# Patient Record
Sex: Female | Born: 1935 | Race: White | Hispanic: No | Marital: Married | Smoking: Never smoker
Health system: Southern US, Community
[De-identification: ages and names within clinical notes are randomized; demographics above are authoritative.]

## PROBLEM LIST (undated history)

## (undated) DIAGNOSIS — G709 Myoneural disorder, unspecified: Secondary | ICD-10-CM

## (undated) DIAGNOSIS — H269 Unspecified cataract: Secondary | ICD-10-CM

## (undated) HISTORY — DX: Unspecified cataract: H26.9

## (undated) HISTORY — PX: CHOLECYSTECTOMY: SHX55

## (undated) HISTORY — DX: Myoneural disorder, unspecified: G70.9

## (undated) HISTORY — PX: EYE SURGERY: SHX253

---

## 2000-07-22 ENCOUNTER — Encounter: Admission: RE | Admit: 2000-07-22 | Discharge: 2000-07-22 | Payer: Self-pay | Admitting: Family Medicine

## 2000-07-22 ENCOUNTER — Encounter: Payer: Self-pay | Admitting: Family Medicine

## 2002-09-06 ENCOUNTER — Encounter: Admission: RE | Admit: 2002-09-06 | Discharge: 2002-09-06 | Payer: Self-pay | Admitting: Family Medicine

## 2002-09-06 ENCOUNTER — Encounter: Payer: Self-pay | Admitting: Family Medicine

## 2004-12-13 ENCOUNTER — Encounter: Admission: RE | Admit: 2004-12-13 | Discharge: 2004-12-13 | Payer: Self-pay | Admitting: Family Medicine

## 2007-05-04 ENCOUNTER — Encounter: Admission: RE | Admit: 2007-05-04 | Discharge: 2007-05-04 | Payer: Self-pay | Admitting: Family Medicine

## 2009-05-01 ENCOUNTER — Encounter: Admission: RE | Admit: 2009-05-01 | Discharge: 2009-05-01 | Payer: Self-pay | Admitting: Family Medicine

## 2010-09-08 ENCOUNTER — Encounter: Payer: Self-pay | Admitting: Family Medicine

## 2011-04-08 ENCOUNTER — Other Ambulatory Visit: Payer: Self-pay | Admitting: Family Medicine

## 2011-04-08 DIAGNOSIS — Z1231 Encounter for screening mammogram for malignant neoplasm of breast: Secondary | ICD-10-CM

## 2011-05-06 ENCOUNTER — Ambulatory Visit: Payer: Self-pay

## 2011-05-08 ENCOUNTER — Ambulatory Visit
Admission: RE | Admit: 2011-05-08 | Discharge: 2011-05-08 | Disposition: A | Discharge status: Home / Self Care | Payer: Medicare Other | Source: Ambulatory Visit | Attending: Family Medicine | Admitting: Family Medicine

## 2011-05-08 DIAGNOSIS — Z1231 Encounter for screening mammogram for malignant neoplasm of breast: Secondary | ICD-10-CM

## 2013-04-16 ENCOUNTER — Telehealth: Payer: Self-pay

## 2013-04-16 ENCOUNTER — Ambulatory Visit (INDEPENDENT_AMBULATORY_CARE_PROVIDER_SITE_OTHER): Payer: Medicare PPO | Admitting: Family Medicine

## 2013-04-16 VITALS — BP 126/72 | HR 71 | Temp 98.3°F | Resp 18 | Ht 66.5 in | Wt 177.0 lb

## 2013-04-16 DIAGNOSIS — B0229 Other postherpetic nervous system involvement: Secondary | ICD-10-CM

## 2013-04-16 DIAGNOSIS — T7840XA Allergy, unspecified, initial encounter: Secondary | ICD-10-CM

## 2013-04-16 MED ORDER — LIDOCAINE 5 % EX PTCH: 1.0000 | MEDICATED_PATCH | CUTANEOUS | Status: AC

## 2013-04-16 MED ORDER — IBUPROFEN 800 MG PO TABS: 800.0000 mg | ORAL_TABLET | Freq: Three times a day (TID) | ORAL | Status: DC | PRN

## 2013-04-16 MED ORDER — PREDNISONE 20 MG PO TABS: ORAL_TABLET | ORAL | Status: DC

## 2013-04-16 MED ORDER — GABAPENTIN 300 MG PO CAPS: 300.0000 mg | ORAL_CAPSULE | Freq: Every day | ORAL | Status: DC

## 2013-04-16 NOTE — Telephone Encounter (Signed)
Patient Patricia Peters called and has asked for someone to call her regarding a topical cream she has placed on her body. The cream has caused her to have a burning sensation. Please call at [phone].

## 2013-04-16 NOTE — Progress Notes (Signed)
77 year old woman comes in with post herpetic neuralgia along T10 dermatome. She's had this since April 8 of this year. She's tried a variety of remedies for this including ibuprofen 800 mg 3 times a day, Lidoderm patches, and then today she tried capsaicin cream. She immediately had burning dysesthesias along the area where she had the cream which persisted for 1-1/2 hours until she was just about to come back to the exam room. At the time I saw her the pain had significantly decreased but the erythema was persisting.  Objective: Patient has a erythematous macular rash along the distribution of the postherpetic changes on the right flank.  Assessment: Toxic reaction to the capsaicin cream  Plan: Discontinue the capsaicin cream. I discussed with patient some other alternatives including gabapentin at night, continue ibuprofen, and refilling the Lidoderm patches. Postherpetic neuralgia - Plan: lidocaine (LIDODERM) 5 %, ibuprofen (ADVIL,MOTRIN) 800 MG tablet, gabapentin (NEURONTIN) 300 MG capsule  Allergic reaction, initial encounter - Plan: predniSONE (DELTASONE) 20 MG tablet  Signed, Elvina Sidle, MD

## 2013-04-16 NOTE — Patient Instructions (Addendum)
Postherpetic Neuralgia Shingles is a painful disease. It is caused by the herpes zoster virus. This is the same virus which also causes chickenpox. It can affect the torso, limbs, or the face. For most people, shingles is a condition of rather sudden onset. Pain usually lasts about 1 month. In older patients, or patients with poor immune systems, a painful, long-standing (chronic) condition called postherpetic neuralgia can develop. This condition rarely happens before age 50. But at least 50% of people over 50 become affected following an attack of shingles. There is a natural tendency for this condition to improve over time with no treatment. Less than 5% of patients have pain that lasts for more than 1 year. DIAGNOSIS  Herpes is usually easily diagnosed on physical exam. Pain sometimes follows when the skin sores (lesions) have disappeared. It is called postherpetic neuralgia. That name simply means the pain that follows herpes. TREATMENT   Treating this condition may be difficult. Usually one of the tricyclic antidepressants, often amitriptyline, is the first line of treatment. There is evidence that the sooner these medications are given, the more likely they are to reduce pain.  Conventional analgesics, regional nerve blocks, and anticonvulsants have little benefit in most cases when used alone. Other tricyclic anti-depressants are used as a second option if the first antidepressant is unsuccessful.  Anticonvulsants, including carbamazepine, have been found to provide some added benefit when used with a tricyclic anti-depressant. This is especially for the stabbing type of pain similar to that of trigeminal neuralgia.  Chronic opioid therapy. This is a strong narcotic pain medication. It is used to treat pain that is resistant to other measures. The issues of dependency and tolerance can be reduced with closely managed care.  Some cream treatments are applied locally to the affected area. They  can help when used with other treatments. Their use may be difficult in the case of postherpetic trigeminal neuralgia. This is involved with the face. So the substances can irritate the eye and the skin around the eye. Examples of creams used include Capsaicin and lidocaine creams.  For shingles, antiviral therapies along with analgesics are recommended. Studies of the effect of anti-viral agents such as acyclovir on shingles have been done. They show improved rates of healing and decreased severity of sudden (acute) pain. Some observations suggest that nerve blocks during shingles infection will:  Reduce pain.  Shorten the acute episode.  Prevent the emergence of postherpetic neuralgia. Viral medications used include Acyclovir (Zovirax), Valacyclovir, Famciclovir and a lysine diet. Document Released: 10/25/2002 Document Revised: 10/27/2011 Document Reviewed: 08/04/2005 ExitCare Patient Information 2014 [employer].  

## 2013-04-18 NOTE — Telephone Encounter (Signed)
She used capsaicin on shingles, has come in for this, has seen Dr Milus Glazier. Want to see how she is called, no answer;

## 2013-06-07 ENCOUNTER — Other Ambulatory Visit: Payer: Self-pay

## 2013-06-07 DIAGNOSIS — Z1231 Encounter for screening mammogram for malignant neoplasm of breast: Secondary | ICD-10-CM

## 2013-06-14 ENCOUNTER — Ambulatory Visit
Admission: RE | Admit: 2013-06-14 | Discharge: 2013-06-14 | Disposition: A | Discharge status: Home / Self Care | Payer: Medicare PPO | Source: Ambulatory Visit

## 2013-06-14 DIAGNOSIS — Z1231 Encounter for screening mammogram for malignant neoplasm of breast: Secondary | ICD-10-CM

## 2013-06-25 ENCOUNTER — Ambulatory Visit: Payer: Medicare PPO

## 2013-06-25 ENCOUNTER — Other Ambulatory Visit: Payer: Self-pay | Admitting: Internal Medicine

## 2013-06-25 ENCOUNTER — Ambulatory Visit (INDEPENDENT_AMBULATORY_CARE_PROVIDER_SITE_OTHER): Payer: Medicare PPO | Admitting: Internal Medicine

## 2013-06-25 VITALS — BP 110/72 | HR 82 | Temp 97.5°F | Resp 18 | Wt 182.0 lb

## 2013-06-25 DIAGNOSIS — R269 Unspecified abnormalities of gait and mobility: Secondary | ICD-10-CM

## 2013-06-25 DIAGNOSIS — M25562 Pain in left knee: Secondary | ICD-10-CM

## 2013-06-25 DIAGNOSIS — B0229 Other postherpetic nervous system involvement: Secondary | ICD-10-CM

## 2013-06-25 DIAGNOSIS — R2689 Other abnormalities of gait and mobility: Secondary | ICD-10-CM

## 2013-06-25 DIAGNOSIS — M25569 Pain in unspecified knee: Secondary | ICD-10-CM

## 2013-06-25 HISTORY — DX: Other postherpetic nervous system involvement: B02.29

## 2013-06-25 NOTE — Progress Notes (Addendum)
This chart was scribed for [employer]. Merla Riches, MD by Caryn Bee, Medical Scribe. This patient was seen in Room/bed  and the patient's care was started at 4:12 PM.  Subjective:    Patient ID: Patricia Peters, female    DOB: 01/28/1936, 77 y.o.   MRN: [id number]  HPI HPI Comments: Patricia Peters is a 77 y.o. female who presents to [facility] complaining of intermittent, gradually worsening left knee pain that began again a couple of weeks ago. Pt states that in August she took a road trip and had done a bit of hiking before this same pain began. After getting out of the car she experienced knee pain. She will be going on another road trip to [place] soon. Pain is worsened when lifting leg and putting it down. She also reports that when she gets up after lying down she can barely walk. Pt states that her pain is worse today than it was yesterday. Pt denies pain while she is lying down. She states that she does a lot of walking daily. Pt states that yesterday she did not walk, but instead went to exercise class.  Worries that the pain will let her knee give way at night when she gets up to urinate. No weakness getting up and down Pain improves with long walks, being up and around Started on volt gel and ibu 800 by PCP 4 d ago  Also has questions re PHN-see hx/wonders if she should take gabap. Currently continuing steady improvement with lido patches,massage.  Review of Systems  Constitutional: Negative for fever, fatigue and unexpected weight change.  Musculoskeletal: Positive for arthralgias (left knee pain) and myalgias.  Skin: Negative for rash.  Psychiatric/Behavioral: Negative for sleep disturbance.   Past Surgical History  Procedure Laterality Date  . Cholecystectomy    . Eye surgery      Past Medical History  Diagnosis Date  . Cataract   . Neuromuscular disorder     Allergies  Allergen Reactions  . Ciprofloxacin         Objective:   Physical Exam  Nursing note and vitals  reviewed. Constitutional: She is oriented to person, place, and time. She appears well-developed and well-nourished. No distress.  HENT:  Head: Normocephalic.  Neck: Normal range of motion.  Pulmonary/Chest: Effort normal. No respiratory distress.  Musculoskeletal: She exhibits tenderness.  Tender along medial joint line along insertion of post med thigh. Tender along upper med tib border No swelling or redness Knee stressors intact Gets up w/out assis but has immed pain gait at this spot  Neurological: She is alert and oriented to person, place, and time.  Skin: Skin is warm and dry. She is not diaphoretic.  Psychiatric: She has a normal mood and affect. Her behavior is normal.     [facility] reading (PRIMARY) by  Dr. Josephina Gip bony problems identified medial joint line and proximal fibula   Assessment & Plan:  Left knee pain -tendonitis suspected  May be a pronator and needing orthotics for her hiking or at least new shoes at this point  Heat/stretch/walk in pool or outsde/then ice 30'///use meds as offered by pCP  PT if not better in 3 weeks  Antalgic gait---rx for walker to stabilize gait and prevent falls at night  PHN (postherpetic neuralgia)---favor no gaba now--cont same rx

## 2015-01-17 DIAGNOSIS — H9209 Otalgia, unspecified ear: Secondary | ICD-10-CM | POA: Diagnosis not present

## 2015-01-17 DIAGNOSIS — H6123 Impacted cerumen, bilateral: Secondary | ICD-10-CM | POA: Diagnosis not present

## 2015-01-18 DIAGNOSIS — B0229 Other postherpetic nervous system involvement: Secondary | ICD-10-CM | POA: Diagnosis not present

## 2015-02-04 ENCOUNTER — Ambulatory Visit (INDEPENDENT_AMBULATORY_CARE_PROVIDER_SITE_OTHER): Payer: Medicare PPO | Admitting: Family Medicine

## 2015-02-04 ENCOUNTER — Ambulatory Visit (INDEPENDENT_AMBULATORY_CARE_PROVIDER_SITE_OTHER): Payer: Medicare PPO

## 2015-02-04 VITALS — BP 109/66 | HR 77 | Temp 98.4°F | Resp 18 | Ht 66.0 in | Wt 184.2 lb

## 2015-02-04 DIAGNOSIS — S99922A Unspecified injury of left foot, initial encounter: Secondary | ICD-10-CM

## 2015-02-04 DIAGNOSIS — S90122A Contusion of left lesser toe(s) without damage to nail, initial encounter: Secondary | ICD-10-CM

## 2015-02-04 NOTE — Progress Notes (Signed)
Subjective:    Patient ID: Patricia Peters, female    DOB: 27-Aug-1935, 79 y.o.   MRN: [id number] Chief Complaint  Patient presents with  . Foot Injury    hit left small toe against rocking chair last night & wanted it checked out before going out of town on Tuesday    HPI  Didn't think it was to bad but has been hurting today more than she expected.  Doesn't think she broke it but would like an xray since she is going on vacation in 2d and wants info on how to care for it - doesn't know if to ice or if she should walk on it - found her tennis shoes don't bother her to much but couldn't wear a sandal.  Past Medical History  Diagnosis Date  . Cataract   . Neuromuscular disorder    Current Outpatient Prescriptions on File Prior to Visit  Medication Sig Dispense Refill  . diclofenac sodium (VOLTAREN) 1 % GEL Apply 4 g topically 4 (four) times daily.    Marland Kitchen ibuprofen (ADVIL,MOTRIN) 800 MG tablet Take 1 tablet (800 mg total) by mouth every 8 (eight) hours as needed for pain. 909 tablet 5  . lidocaine (LIDODERM) 5 % Place 1 patch onto the skin daily. Remove & Discard patch within 12 hours or as directed by MD 30 patch 5   No current facility-administered medications on file prior to visit.   Allergies  Allergen Reactions  . Ciprofloxacin      Review of Systems  Constitutional: Positive for activity change. Negative for fever, chills and unexpected weight change.  Musculoskeletal: Positive for joint swelling, arthralgias and gait problem. Negative for myalgias and back pain.  Skin: Positive for color change. Negative for rash and wound.  Neurological: Negative for weakness and numbness.  Hematological: Negative for adenopathy. Does not bruise/bleed easily.       Objective:  BP 109/66 mmHg  Pulse 77  Temp(Src) 98.4 F (36.9 C) (Oral)  Resp 18  Ht 5\' 6"  (1.676 m)  Wt 184 lb 4 oz (83.575 kg)  BMI 29.75 kg/m2  SpO2 97%  Physical Exam  Constitutional: She is oriented to person,  place, and time. She appears well-developed and well-nourished. No distress.  HENT:  Head: Normocephalic and atraumatic.  Right Ear: External ear normal.  Eyes: Conjunctivae are normal. No scleral icterus.  Pulmonary/Chest: Effort normal.  Musculoskeletal:       Left foot: There is decreased range of motion, tenderness, bony tenderness and swelling.       Feet:  Bruising over medial aspect of fifth phalanx with minimal ttp over MTP, more ttp over mid-phalanx.  Neurological: She is alert and oriented to person, place, and time.  Skin: Skin is warm and dry. She is not diaphoretic. No erythema.  Psychiatric: She has a normal mood and affect. Her behavior is normal.      [facility] reading (PRIMARY) by  Dr. Clelia Croft. Left 5th toe: no acute bony injury    Assessment & Plan:   1. Contusion of fifth toe of left foot, initial encounter   2. Toe injury, left, initial encounter   Pt reassured that toe sprain only - RICE. Can buddy tape x 1 wk, try firm soled shoe with wide toe box like a medical clog. Can transition back to normal shoe wear as pain allows. If still any sxs in 3-4 wks, RTC for further eval.  Orders Placed This Encounter  Procedures  . DG Toe 5th  Left    Standing Status: Future     Number of Occurrences: 1     Standing Expiration Date: 02/04/2016    Order Specific Question:  Reason for Exam (SYMPTOM  OR DIAGNOSIS REQUIRED)    Answer:  pain    Order Specific Question:  Preferred imaging location?    Answer:  External     Norberto Sorenson, MD MPH

## 2015-02-04 NOTE — Patient Instructions (Signed)
Buddy Taping of Toes °We have taped your toes together to keep them from moving. This is called "buddy taping" since we used a part of your own body to keep the injured part still. We placed soft padding between your toes to keep them from rubbing against each other. Buddy taping will help with healing and to reduce pain. Keep your toes buddy taped together for as long as directed by your caregiver. °HOME CARE INSTRUCTIONS  °· Raise your injured area above the level of your heart while sitting or lying down. Prop it up with pillows. °· An ice pack used every twenty minutes, while awake, for the first one to two days may be helpful. Put ice in a plastic bag and put a towel between the bag and your skin. °· Watch for signs that the taping is too tight. These signs may be: °¨ Numbness of your taped toes. °¨ Coolness of your taped toes. °¨ Color change in the area beyond the tape. °¨ Increased pain. °· If you have any of these signs, loosen or rewrap the tape. If you need to loosen or rewrap the buddy tape, make sure you use the padding again. °SEEK IMMEDIATE MEDICAL CARE IF:  °· You have worse pain, swelling, inflammation (soreness), drainage or bleeding after you rewrap the tape. °· Any new problems occur. °MAKE SURE YOU:  °· Understand these instructions. °· Will watch your condition. °· Will get help right away if you are not doing well or get worse. °Document Released: 05/08/2004 Document Revised: 10/27/2011 Document Reviewed: 08/01/2008 °ExitCare® Patient Information ©2015 [employer]. This information is not intended to replace advice given to you by your health care provider. Make sure you discuss any questions you have with your health care provider. ° °

## 2015-02-16 ENCOUNTER — Encounter: Payer: Self-pay | Admitting: Family Medicine

## 2015-04-30 ENCOUNTER — Other Ambulatory Visit: Payer: Self-pay

## 2015-04-30 DIAGNOSIS — Z1231 Encounter for screening mammogram for malignant neoplasm of breast: Secondary | ICD-10-CM

## 2015-05-15 ENCOUNTER — Ambulatory Visit
Admission: RE | Admit: 2015-05-15 | Discharge: 2015-05-15 | Disposition: A | Discharge status: Home / Self Care | Payer: Medicare PPO | Source: Ambulatory Visit

## 2015-05-15 DIAGNOSIS — Z1231 Encounter for screening mammogram for malignant neoplasm of breast: Secondary | ICD-10-CM | POA: Diagnosis not present

## 2015-06-20 DIAGNOSIS — J309 Allergic rhinitis, unspecified: Secondary | ICD-10-CM | POA: Diagnosis not present

## 2015-06-20 DIAGNOSIS — Z23 Encounter for immunization: Secondary | ICD-10-CM | POA: Diagnosis not present

## 2015-06-20 DIAGNOSIS — B0229 Other postherpetic nervous system involvement: Secondary | ICD-10-CM | POA: Diagnosis not present

## 2015-06-20 DIAGNOSIS — E7800 Pure hypercholesterolemia, unspecified: Secondary | ICD-10-CM | POA: Diagnosis not present

## 2016-04-17 ENCOUNTER — Ambulatory Visit (INDEPENDENT_AMBULATORY_CARE_PROVIDER_SITE_OTHER): Payer: Medicare Other

## 2016-04-17 DIAGNOSIS — Z23 Encounter for immunization: Secondary | ICD-10-CM | POA: Diagnosis not present

## 2016-05-23 ENCOUNTER — Ambulatory Visit (INDEPENDENT_AMBULATORY_CARE_PROVIDER_SITE_OTHER): Payer: Medicare Other

## 2016-05-23 ENCOUNTER — Ambulatory Visit (INDEPENDENT_AMBULATORY_CARE_PROVIDER_SITE_OTHER): Payer: Medicare Other | Admitting: Family Medicine

## 2016-05-23 VITALS — BP 112/72 | HR 79 | Temp 98.2°F | Resp 16 | Ht 66.0 in | Wt 183.4 lb

## 2016-05-23 DIAGNOSIS — M25561 Pain in right knee: Secondary | ICD-10-CM

## 2016-05-23 DIAGNOSIS — M1711 Unilateral primary osteoarthritis, right knee: Secondary | ICD-10-CM | POA: Diagnosis not present

## 2016-05-23 MED ORDER — IBUPROFEN 800 MG PO TABS: 800.0000 mg | ORAL_TABLET | Freq: Three times a day (TID) | ORAL | 0 refills | Status: DC | PRN

## 2016-05-23 NOTE — Progress Notes (Signed)
Subjective:  By signing my name below, I, Raven Peters, attest that this documentation has been prepared under the direction and in the presence of Patricia SorensonEva Axyl Sitzman, MD.  Electronically Signed: Andrew Auaven Peters, ED Scribe. 05/23/2016. 4:33 PM.   Patient ID: Patricia SessionsJoan Albaugh V., female    DOB: November 26, 1935, 80 y.o.   MRN: [id number]  HPI Chief Complaint  Patient presents with  . Leg Pain    right leg pain since monday    HPI Comments: Patricia SessionsJoan Parveen V. is a 80 y.o. female who presents to the [facility] complaining of right leg pain onset 4 days ago. Pt denies injury or fall.  She is a frequent hiker; last hiked 5 days ago prior to leg pain. She reports pain with climbing stairs with worsening pain during the night. She denies right leg locking or giving way. She's tried Voltaren with minimal relief. Pt reports hx of left leg pain 2-3 years ago  relieved with acupuncture. .   [place] about [place] so really hoping to improve her pain prior.  Patient Active Problem List   Diagnosis Date Noted  . PHN (postherpetic neuralgia) 06/25/2013   Past Medical History:  Diagnosis Date  . Cataract   . Neuromuscular disorder [facility](HCC)    Past Surgical History:  Procedure Laterality Date  . CHOLECYSTECTOMY    . EYE SURGERY     Allergies  Allergen Reactions  . Ciprofloxacin    Prior to Admission medications   Medication Sig Start Date End Date Taking? Authorizing Provider  diclofenac sodium (VOLTAREN) 1 % GEL Apply 4 g topically 4 (four) times daily.   Yes Historical Provider, MD  ibuprofen (ADVIL,MOTRIN) 800 MG tablet Take 1 tablet (800 mg total) by mouth every 8 (eight) hours as needed for pain. 04/16/13  Yes Elvina SidleKurt Lauenstein, MD  lidocaine (LIDODERM) 5 % Place 1 patch onto the skin daily. Remove & Discard patch within 12 hours or as directed by MD 04/16/13  Yes Elvina SidleKurt Lauenstein, MD   Social History   Social History  . Marital status: Married    Spouse name: N/A  . Number  of children: N/A  . Years of education: N/A   Occupational History  . Not on file.   Social History Main Topics  . Smoking status: Never Smoker  . Smokeless tobacco: Never Used  . Alcohol use Not on file  . Drug use: Unknown  . Sexual activity: Not on file   Other Topics Concern  . Not on file   Social History Narrative  . No narrative on file   [facility] 05/23/2016 02/04/2015  Decreased Interest 0 0  Down, Depressed, Hopeless 0 0  PHQ - 2 Score 0 0    Review of Systems  Constitutional: Positive for activity change. Negative for appetite change, chills and fever.  Cardiovascular: Negative for leg swelling.  Musculoskeletal: Positive for arthralgias. Negative for gait problem, joint swelling and myalgias.  Skin: Negative for color change, rash and wound.  Neurological: Negative for weakness and numbness.  Psychiatric/Behavioral: Positive for sleep disturbance. Negative for dysphoric mood.   Objective:   Physical Exam  Constitutional: She is oriented to person, place, and time. She appears well-developed and well-nourished. No distress.  HENT:  Head: Normocephalic and atraumatic.  Eyes: Conjunctivae and EOM are normal.  Neck: Neck supple.  Cardiovascular: Normal rate.   Pulmonary/Chest: Effort normal.  Musculoskeletal: Normal range of motion.  Tenderness of pes anserine  bursa and distal medial joint line. Positive McMurray's. minimal crepitous. Negative anterior and posterior Drawer's. Minimal pain with Valgus and Varus with minimal laxity.  Neurological: She is alert and oriented to person, place, and time.  Skin: Skin is warm and dry.  Psychiatric: She has a normal mood and affect. Her behavior is normal.  Nursing note and vitals reviewed.  Vitals:   05/23/16 1629  BP: 112/72  Pulse: 79  Resp: 16  Temp: 98.2 F (36.8 C)  TempSrc: Oral  SpO2: 96%  Weight: 183 lb 6.4 oz (83.2 kg)  Height: 5\' 6"  (1.676 m)   Dg Knee 1-2 Views Right  Result  Date: 05/23/2016 CLINICAL DATA:  New pain.  No known injury. EXAM: RIGHT KNEE - 1-2 VIEW COMPARISON:  None. FINDINGS: Mild joint space narrowing and early spurring in all 3 compartments. No acute bony abnormality. Specifically, no fracture, subluxation, or dislocation. Soft tissues are intact. No joint effusion IMPRESSION: Mild degenerative changes.  No acute bony abnormality. Electronically Signed   By: Charlett Nose M.D.   On: 05/23/2016 17:10    Assessment & Plan:   1. Right medial knee pain   2. Osteoarthritis of right knee, unspecified osteoarthritis type   Placed in Rt medial OA brace - rec wearing this on her [place] trip. RICE for sev d.  Orders Placed This Encounter  Procedures  . DG Knee 1-2 Views Right    Standing films to eval for OA please    Standing Status:   Future    Number of Occurrences:   1    Standing Expiration Date:   05/23/2017    Order Specific Question:   Reason for Exam (SYMPTOM  OR DIAGNOSIS REQUIRED)    Answer:   new pain just distal to the medial posterior joint line - no known injury    Order Specific Question:   Preferred imaging location?    Answer:   External    Meds ordered this encounter  Medications  . ibuprofen (ADVIL,MOTRIN) 800 MG tablet    Sig: Take 1 tablet (800 mg total) by mouth every 8 (eight) hours as needed.    Dispense:  90 tablet    Refill:  0    I personally performed the services described in this documentation, which was scribed in my presence. The recorded information has been reviewed and considered, and addended by me as needed.   Patricia Sorenson, M.D.  [facility] [address] [place], [place] [address] [phone] phone [phone] fax  05/24/16 10:46 PM

## 2016-05-23 NOTE — Patient Instructions (Addendum)
IF you received an x-ray today, you will receive an invoice from [facility]. Please contact [facility] at [phone] with questions or concerns regarding your invoice.   IF you received labwork today, you will receive an invoice from [employer]. Please contact [facility] at [phone] with questions or concerns regarding your invoice.   Our billing staff will not be able to assist you with questions regarding bills from these companies.  You will be contacted with the lab results as soon as they are available. The fastest way to get your results is to activate your My Chart account. Instructions are located on the last page of this paperwork. If you have not heard from [place] regarding the results in 2 weeks, please contact this office.    I do not think you have injured your ligament from your exam today.  However, all of the information about ice, heat, bracing, stretching, and exercises from this will apply to this osteoarthritis flair that you are having.  Medial Collateral Knee Ligament Sprain With Phase I Rehab The medial collateral ligament (MCL) of the knee helps hold the knee joint in proper alignment and prevents the bones from shifting out of alignment (displacing) to the inside (medially). Injury to the knee may cause a tear in the MCL ligament (sprain). Sprains may heal without treatment, but this often results in a loose joint. Sprains are classified into three categories. Grade 1 sprains cause pain, but the tendon is not lengthened. Grade 2 sprains include a lengthened ligament, due to the ligament being stretched or partially ruptured. With grade 2 sprains, there is still function, although possibly decreased. Grade 3 sprains involve a complete tear of the tendon or muscle, and function is usually impaired. SYMPTOMS   Pain and tenderness on the inner side of the knee.  A "pop," tearing or pulling sensation at the time of  injury.  Bruising (contusion) at the site of injury, within 48 hours of injury.  Knee stiffness.  Limping, often walking with the knee bent. CAUSES  An MCL sprain occurs when a force is placed on the ligament that is greater than it can handle. Common mechanisms of injury include:  Direct hit (trauma) to the outer side of the knee, especially if the foot is planted on the ground.  Forceful pivoting of the body and leg, while the foot is planted on the ground. RISK INCREASES WITH:  Contact sports (football, rugby).  Sports that require pivoting or cutting (soccer).  Poor knee strength and flexibility.  Improper equipment use. PREVENTION  Warm up and stretch properly before activity.  Maintain physical fitness:  Strength, flexibility and endurance.  Cardiovascular fitness.  Wear properly fitted protective equipment (correct length of cleats for surface).  Functional braces may be effective in preventing injury. PROGNOSIS  MCL tears usually heal without the need for surgery. Sometimes however, surgery is required. RELATED COMPLICATIONS  Frequently recurring symptoms, such as the knee giving way, knee instability or knee swelling.  Injury to other structures in the knee joint:  Meniscal cartilage, resulting in locking and swelling of the knee.  Articular cartilage, resulting in knee arthritis.  Other ligaments of the knee.  Injury to nerves, resulting in numbness of the outer leg, foot or ankle and weakness or paralysis, with inability to raise the ankle or toes.  Knee stiffness. TREATMENT Treatment first involves the use of ice and medicine, to reduce pain and inflammation. The use of strengthening and stretching exercises may help reduce pain  with activity. These exercises may be performed at home, but referral to a therapist is often advised. You may be advised to walk with crutches until you are able to walk without a limp. Your caregiver may provide you with a  hinged knee brace to help regain a full range of motion, while also protecting the injured knee. For severe MCL injuries or injuries that involve other ligaments of the knee, surgery is often advised. MEDICATION  Do not take pain medicine for 7 days before surgery.  Only use over-the-counter pain medicine as directed by your caregiver.  Only use prescription pain relievers as directed and only in needed amounts. HEAT AND COLD  Cold treatment (icing) should be applied for 10 to 15 minutes every 2 to 3 hours for inflammation and pain, and immediately after any activity, that aggravates the symptoms. Use ice packs or an ice massage.  Heat treatment may be used before performing stretching and strengthening activities prescribed by your caregiver, physical therapist or athletic trainer. Use a heat Zafir Schauer or warm water soak. SEEK MEDICAL CARE IF:   Symptoms get worse or do not improve in 4 to 6 weeks, despite treatment.  New, unexplained symptoms develop. EXERCISES  PHASE I EXERCISES  RANGE OF MOTION (ROM) AND STRETCHING EXERCISES-Medial Collateral Knee Ligament Sprain Phase I These are some of the initial exercises that your physician, physical therapist or athletic trainer may have you perform to begin your rehabilitation. When you demonstrate gains in your flexibility and strength, your caregiver may progress you to Phase II exercises. As you perform these exercises, remember:  These initial exercises are intended to be gentle. They will help you restore motion without increasing any swelling.  Completing these exercises allows less painful movement and prepares you for the more aggressive strengthening exercises in Phase II.  An effective stretch should be held for at least 30 seconds.  A stretch should never be painful. You should only feel a gentle lengthening or release in the stretched tissue. RANGE OF MOTION-Knee Flexion, Active  Lie on your back with both knees straight. (If this  causes back discomfort, bend your healthy knee, placing your foot flat on the floor.)  Slowly slide your heel back toward your buttocks until you feel a gentle stretch in the front of your knee or thigh.  Hold for __________ seconds. Slowly slide your heel back to the starting position. Repeat __________ times. Complete this exercise __________ times per day. STRETCH-Knee Flexion, Supine  Lie on the floor with your right / left heel and foot lightly touching the wall. (Place both feet on the wall if you do not use a door frame.)  Without using any effort, allow gravity to slide your foot down the wall slowly until you feel a gentle stretch in the front of your right / left knee.  Hold this stretch for __________ seconds. Then return the leg to the starting position, using your health leg for help, if needed. Repeat __________ times. Complete this stretch __________ times per day. RANGE OF MOTION-Knee Flexion and Extension, Active-Assisted  Sit on the edge of a table or chair with your thighs firmly supported. It may be helpful to place a folded towel under the end of your right / left thigh.  Flexion (bending): Place the ankle of your healthy leg on top of the other ankle. Use your healthy leg to gently bend your right / left knee until you feel a mild tension across the top of your knee.  Hold  for __________ seconds.  Extension (straightening): Switch your ankles so your right / left leg is on top. Use your healthy leg to straighten your right / left knee until you feel a mild tension on the backside of your knee.  Hold for __________ seconds. Repeat __________ times. Complete this exercise __________ times per day. STRETCH-Knee Extension Sitting  Sit with your right / left leg/heel propped on another chair, coffee table, or foot stool.  Allow your leg muscles to relax, letting gravity straighten out your knee.*  You should feel a stretch behind your right / left knee. Hold this  position for __________ seconds. Repeat __________ times. Complete this stretch __________ times per day. *Your physician, physical therapist or athletic trainer may instruct you to place a __________ weight on your thigh, just above your kneecap, to deepen the stretch. STRENGTHENING EXERCISES-Medial Collateral Knee Ligament Sprain Phase I These exercises may help you when beginning to rehabilitate your injury. They may resolve your symptoms with or without further involvement from your physician, physical therapist or athletic trainer. While completing these exercises, remember:   In order to return to more demanding activities, you will likely need to progress to more challenging exercises. Your physician, physical therapist or athletic trainer will advance your exercises when your tissues show adequate healing and your muscles demonstrate increased strength.  Muscles can gain both the endurance and the strength needed for everyday activities through controlled exercises.  Complete these exercises as instructed by your physician, physical therapist or athletic trainer. Increase the resistance and repetitions only as guided by your caregiver. STRENGTH-Quadriceps, Isometrics  Lie on your back with your right / left leg extended and your opposite knee bent.  Gradually tense the muscles in the front of your right / left thigh. You should see either your kneecap slide up toward your hip or an increased dimpling just above the knee. This motion will push the back of the knee down toward the floor, mat or bed on which you are lying.  Hold the muscle as tight as you can without increasing your pain for __________ seconds.  Relax the muscles slowly and completely in between each repetition. Repeat __________ times. Complete this exercise __________ times per day. STRENGTH-Quadriceps, Short Arcs  Lie on your back. Place a __________ inch towel roll under your knee so that the knee slightly  bends.  Raise only your lower leg by tightening the muscles in the front of your thigh. Do not allow your thigh to rise.  Hold this position for __________ seconds. Repeat __________ times. Complete this exercise __________ times per day. OPTIONAL ANKLE WEIGHTS: Begin with ____________________, but DO NOT exceed ____________________. Increase in 1 pound/0.5 kilogram increments.  STRENGTH--Quadriceps, Straight Leg Raises Quality counts! Watch for signs that the quadriceps muscle is working, to be sure you are strengthening the correct muscles and not "cheating" by substituting with healthier muscles.  Lay on your back with your right / left leg extended and your opposite knee bent.  Tense the muscles in the front of your right / left thigh. You should see either your knee cap slide up or increased dimpling just above the knee. Your thigh may even shake a bit.  Tighten these muscles even more and raise your leg 4 to 6 inches off the floor. Hold for __________ seconds.  Keeping these muscles tense, lower your leg.  Relax the muscles slowly and completely in between each repetition. Repeat __________ times. Complete this exercise __________ times per day. STRENGTH-Hamstring, Isometrics  Lie on your back on a firm surface.  Bend your right / left knee approximately __________ degrees.  Dig your heel into the surface as if you are trying to pull it toward your buttocks. Tighten the muscles in the back of your thighs to "dig" as hard as you can, without increasing any pain.  Hold this position for __________ seconds.  Release the tension gradually and allow your muscle to completely relax for __________ seconds in between each exercise. Repeat __________ times. Complete this exercise __________ times per day. STRENGTH-Hamstring, Curls  Lay on your stomach with your legs extended. (If you lay on a bed, your feet may hang over the edge.)  Tighten the muscles in the back of your thigh to  bend your right / left knee up to 90 degrees. Keep your hips flat on the bed.  Hold this position for __________ seconds.  Slowly lower your leg back to the starting position. Repeat __________ times. Complete this exercise __________ times per day. OPTIONAL ANKLE WEIGHTS: Begin with ____________________, but DO NOT exceed ____________________. Increase in 1 pound/0.5 kilogram increments.    This information is not intended to replace advice given to you by your health care provider. Make sure you discuss any questions you have with your health care provider.   Document Released: 08/04/2005 Document Revised: 08/25/2014 Document Reviewed: 11/16/2008 Elsevier Interactive Patient Education [employer]

## 2016-05-28 ENCOUNTER — Other Ambulatory Visit: Payer: Self-pay

## 2016-05-28 NOTE — Telephone Encounter (Signed)
diclofenac sodium (VOLTAREN) 1 % GEL  Patient stated she need Dr. Clelia CroftShaw to authorize the medication. Patient stated pharmacy don't consider it to be a refill.   [phone].

## 2016-05-29 NOTE — Telephone Encounter (Signed)
Last seen 05/2016 for knee pain, given ibuprofen rx.

## 2016-05-30 ENCOUNTER — Telehealth: Payer: Self-pay

## 2016-05-30 MED ORDER — DICLOFENAC SODIUM 1 % TD GEL: 4.0000 g | Freq: Four times a day (QID) | TRANSDERMAL | Status: DC

## 2016-05-30 NOTE — Telephone Encounter (Signed)
Rx request for new rx for Voltaren Gel. Spoke with Dr. Clelia CroftShaw and she approved. Sent refill to [facility].

## 2016-06-04 ENCOUNTER — Other Ambulatory Visit: Payer: Self-pay | Admitting: Emergency Medicine

## 2016-06-04 MED ORDER — DICLOFENAC SODIUM 1 % TD GEL: 4.0000 g | Freq: Four times a day (QID) | TRANSDERMAL | Status: DC

## 2016-06-04 NOTE — Telephone Encounter (Signed)
Clarification: Medication Voltaren gel printed instead of e[facility]scribed [facility] [place] I will call medication in

## 2016-06-04 NOTE — Telephone Encounter (Signed)
CB [phone] for pt. [facility] [address] [phone] [facility] Phone #[phone]. Pt says she never got this script. [place] [facility] [place] She needs by Friday, she is traveling out of town. p

## 2016-06-04 NOTE — Telephone Encounter (Signed)
Duplicate, see notes under 10/13 message.

## 2016-06-04 NOTE — Telephone Encounter (Signed)
[facility] and checked with pharm to see if it was covered by ins. He reported that it was not, cash price $48. Tried to do PA under [facility] and also [facility] who manages [employer] health plan, but neither recognized pt. LMOM for pt giving her above info. Asked her to [facility] w/name of comp who manages her Rx coverage on back of ins card and/or the number for pharmacies to call if she would like for me to try a PA.

## 2016-06-05 NOTE — Telephone Encounter (Signed)
Got a VM from [employer] asking me to call w/info on PA for this pt. Called them w/reference # [id number], and was told that the PA was approved through 08/17/16.

## 2017-03-28 ENCOUNTER — Encounter: Payer: Self-pay | Admitting: Family Medicine

## 2017-03-28 ENCOUNTER — Ambulatory Visit (INDEPENDENT_AMBULATORY_CARE_PROVIDER_SITE_OTHER): Payer: Medicare Other | Admitting: Family Medicine

## 2017-03-28 VITALS — BP 110/71 | HR 83 | Temp 98.6°F | Resp 16 | Ht 65.5 in | Wt 181.0 lb

## 2017-03-28 DIAGNOSIS — R059 Cough, unspecified: Secondary | ICD-10-CM

## 2017-03-28 DIAGNOSIS — R05 Cough: Secondary | ICD-10-CM | POA: Diagnosis not present

## 2017-03-28 DIAGNOSIS — J040 Acute laryngitis: Secondary | ICD-10-CM

## 2017-03-28 LAB — POCT RAPID STREP A (OFFICE): RAPID STREP A SCREEN: NEGATIVE

## 2017-03-28 MED ORDER — GUAIFENESIN-CODEINE 100-10 MG/5ML PO SOLN
5.0000 mL | Freq: Four times a day (QID) | ORAL | 0 refills | Status: DC | PRN
Start: 2017-03-28 — End: 2023-05-03

## 2017-03-28 NOTE — Progress Notes (Signed)
Subjective:    Patient ID: Patricia SessionsJoan Turski V., female    DOB: 15-Apr-1936, 81 y.o.   MRN: [id number]  03/28/2017  Sore Throat (3 days, loss voice, ) and Cough (3 days , HA)   HPI This 81 y.o. female presents for evaluation of laryngitis and cough for the past three days.  No fever/chills/sweats.  +HA.  L ear pain intermitent.  +ST now resolved; duratoin two days only.  No pain to swallow.  +rhinorrhea and nasla congestion.  +PND.  No sneezing.  +coughing a lot; rare sputum production. No SOB.  No wheezing.  No n/v/d.  No sick contacts.  Took Claritin for allergies.  With heaadche has been taking Tylenol nad Advil.  Non-smoker.  Requesting hydrocodone cough syrup or codeine cough syrup.   BP Readings from Last 3 Encounters:  03/28/17 110/71  05/23/16 112/72  02/04/15 109/66   Wt Readings from Last 3 Encounters:  03/28/17 181 lb (82.1 kg)  05/23/16 183 lb 6.4 oz (83.2 kg)  02/04/15 184 lb 4 oz (83.6 kg)   Immunization History  Administered Date(s) Administered  . Influenza,inj,Quad PF,36+ Mos 04/17/2016    Review of Systems  Constitutional: Positive for fatigue. Negative for chills, diaphoresis and fever.  HENT: Positive for congestion, ear pain, rhinorrhea and voice change. Negative for postnasal drip, sinus pressure, sore throat and trouble swallowing.   Respiratory: Positive for cough. Negative for shortness of breath and wheezing.   Cardiovascular: Negative for chest pain, palpitations and leg swelling.  Gastrointestinal: Negative for abdominal pain, constipation, diarrhea, nausea and vomiting.  Neurological: Positive for headaches.    Past Medical History:  Diagnosis Date  . Cataract   . Neuromuscular disorder [facility](HCC)    Past Surgical History:  Procedure Laterality Date  . CHOLECYSTECTOMY    . EYE SURGERY     Allergies  Allergen Reactions  . Ciprofloxacin   . Sulfa Antibiotics Other (See Comments)    dizzy    Social History   Social History  . Marital status:  Married    Spouse name: N/A  . Number of children: N/A  . Years of education: N/A   Occupational History  . Not on file.   Social History Main Topics  . Smoking status: Never Smoker  . Smokeless tobacco: Never Used  . Alcohol use Not on file  . Drug use: Unknown  . Sexual activity: Not on file   Other Topics Concern  . Not on file   Social History Narrative  . No narrative on file   Family History  Problem Relation Age of Onset  . Diabetes Mother   . Cancer Father   . Cancer Maternal Grandmother        Objective:    BP 110/71   Pulse 83   Temp 98.6 F (37 C) (Oral)   Resp 16   Ht 5' 5.5" (1.664 m)   Wt 181 lb (82.1 kg)   SpO2 95%   BMI 29.66 kg/m  Physical Exam  Constitutional: She is oriented to person, place, and time. She appears well-developed and well-nourished. No distress.  HENT:  Head: Normocephalic and atraumatic.  Right Ear: Tympanic membrane, external ear and ear canal normal.  Left Ear: Tympanic membrane, external ear and ear canal normal.  Nose: Rhinorrhea present. No mucosal edema. Right sinus exhibits no maxillary sinus tenderness and no frontal sinus tenderness. Left sinus exhibits no maxillary sinus tenderness and no frontal sinus tenderness.  Mouth/Throat: Uvula is midline. Posterior oropharyngeal erythema present.  No oropharyngeal exudate, posterior oropharyngeal edema or tonsillar abscesses.  Eyes: Pupils are equal, round, and reactive to light. Conjunctivae are normal.  Neck: Normal range of motion. Neck supple. No thyromegaly present.  Cardiovascular: Normal rate, regular rhythm and normal heart sounds.  Exam reveals no gallop and no friction rub.   No murmur heard. Pulmonary/Chest: Effort normal and breath sounds normal. She has no wheezes. She has no rales.  Lymphadenopathy:    She has no cervical adenopathy.  Neurological: She is alert and oriented to person, place, and time.  Skin: She is not diaphoretic.  Psychiatric: She has a  normal mood and affect. Her behavior is normal.  Nursing note and vitals reviewed.  Results for orders placed or performed in visit on 03/28/17  POCT rapid strep A  Result Value Ref Range   Rapid Strep A Screen Negative Negative   No results found. Depression screen [facility] 2/9 03/28/2017 05/23/2016 02/04/2015  Decreased Interest 0 0 0  Down, Depressed, Hopeless 0 0 0  PHQ - 2 Score 0 0 0   Fall Risk  03/28/2017 05/23/2016 02/04/2015  Falls in the past year? No No No        Assessment & Plan:   1. Laryngitis   2. Cough    -new onset; send throat cx yet consistent with viral etiology. -recommend voice rest, keeping oropharynx moist. -continue Claritin daily. -rx for Robitussin with CDN per patient request.   -call if no improvement in one week or for acute worsening.   Orders Placed This Encounter  Procedures  . Culture, Group A Strep    Order Specific Question:   Source    Answer:   oropharynx  . POCT rapid strep A   Meds ordered this encounter  Medications  . guaiFENesin-codeine 100-10 MG/5ML syrup    Sig: Take 5 mLs by mouth every 6 (six) hours as needed for cough.    Dispense:  180 mL    Refill:  0    No Follow-up on file.   Kristi Paulita Fujita, M.D. [facility] at [facility] [address] [place], [place]  [address] [phone] phone [phone] fax

## 2017-03-28 NOTE — Patient Instructions (Addendum)
   IF you received an x-ray today, you will receive an invoice from [facility]. Please contact  Radiology at [phone] with questions or concerns regarding your invoice.   IF you received labwork today, you will receive an invoice from [facility]. Please contact [facility] at [phone] with questions or concerns regarding your invoice.   Our billing staff will not be able to assist you with questions regarding bills from these companies.  You will be contacted with the lab results as soon as they are available. The fastest way to get your results is to activate your My Chart account. Instructions are located on the last page of this paperwork. If you have not heard from us regarding the results in 2 weeks, please contact this office.      Laryngitis Laryngitis is inflammation of your vocal cords. This causes hoarseness, coughing, loss of voice, sore throat, or a dry throat. Your vocal cords are two bands of muscles that are found in your throat. When you speak, these cords come together and vibrate. These vibrations come out through your mouth as sound. When your vocal cords are inflamed, your voice sounds different. Laryngitis can be temporary (acute) or long-term (chronic). Most cases of acute laryngitis improve with time. Chronic laryngitis is laryngitis that lasts for more than three weeks. What are the causes? Acute laryngitis may be caused by:  A viral infection.  Lots of talking, yelling, or singing. This is also called vocal strain.  Bacterial infections.  Chronic laryngitis may be caused by:  Vocal strain.  Injury to your vocal cords.  Acid reflux (gastroesophageal reflux disease or GERD).  Allergies.  Sinus infection.  Smoking.  Alcohol abuse.  Breathing in chemicals or dust.  Growths on the vocal cords.  What increases the risk? Risk factors for laryngitis include:  Smoking.  Alcohol abuse.  Having allergies.  What are the  signs or symptoms? Symptoms of laryngitis may include:  Low, hoarse voice.  Loss of voice.  Dry cough.  Sore throat.  Stuffy nose.  How is this diagnosed? Laryngitis may be diagnosed by:  Physical exam.  Throat culture.  Blood test.  Laryngoscopy. This procedure allows your health care provider to look at your vocal cords with a mirror or viewing tube.  How is this treated? Treatment for laryngitis depends on what is causing it. Usually, treatment involves resting your voice and using medicines to soothe your throat. However, if your laryngitis is caused by a bacterial infection, you may need to take antibiotic medicine. If your laryngitis is caused by a growth, you may need to have a procedure to remove it. Follow these instructions at home:  Drink enough fluid to keep your urine clear or pale yellow.  Breathe in moist air. Use a humidifier if you live in a dry climate.  Take medicines only as directed by your health care provider.  If you were prescribed an antibiotic medicine, finish it all even if you start to feel better.  Do not smoke cigarettes or electronic cigarettes. If you need help quitting, ask your health care provider.  Talk as little as possible. Also avoid whispering, which can cause vocal strain.  Write instead of talking. Do this until your voice is back to normal. Contact a health care provider if:  You have a fever.  You have increasing pain.  You have difficulty swallowing. Get help right away if:  You cough up blood.  You have trouble breathing. This information is not intended   to replace advice given to you by your health care provider. Make sure you discuss any questions you have with your health care provider. Document Released: 08/04/2005 Document Revised: 01/10/2016 Document Reviewed: 01/17/2014 Elsevier Interactive Patient Education  2018 [employer] Inc.  

## 2017-03-30 ENCOUNTER — Ambulatory Visit: Payer: Medicare Other | Admitting: Family Medicine

## 2017-03-31 LAB — CULTURE, GROUP A STREP: Strep A Culture: NEGATIVE

## 2017-04-02 ENCOUNTER — Telehealth: Payer: Self-pay | Admitting: Family Medicine

## 2017-04-02 NOTE — Telephone Encounter (Signed)
Pt states that she still has a cough and needs to know what she can do next  Best number [phone]

## 2017-04-06 ENCOUNTER — Encounter: Payer: Self-pay | Admitting: Family Medicine

## 2017-04-06 ENCOUNTER — Ambulatory Visit (INDEPENDENT_AMBULATORY_CARE_PROVIDER_SITE_OTHER): Payer: Medicare Other | Admitting: Family Medicine

## 2017-04-06 VITALS — BP 148/82 | HR 81 | Temp 97.9°F | Resp 16 | Ht 65.0 in | Wt 182.0 lb

## 2017-04-06 DIAGNOSIS — R05 Cough: Secondary | ICD-10-CM

## 2017-04-06 DIAGNOSIS — J22 Unspecified acute lower respiratory infection: Secondary | ICD-10-CM | POA: Diagnosis not present

## 2017-04-06 DIAGNOSIS — R059 Cough, unspecified: Secondary | ICD-10-CM

## 2017-04-06 MED ORDER — AZITHROMYCIN 250 MG PO TABS: ORAL_TABLET | ORAL | 0 refills | Status: DC

## 2017-04-06 NOTE — Progress Notes (Signed)
Subjective:  By signing my name below, I, Patricia Peters, attest that this documentation has been prepared under the direction and in the presence of Shade Flood, MD Electronically Signed: Charline Bills, ED Scribe 04/06/2017 at 5:47 PM.   Patient ID: Patricia Peters., female    DOB: 1935/10/02, 81 y.o.   MRN: [id number]  Chief Complaint  Patient presents with  . Follow-up    ongoing cough and chest congestion   HPI Patricia Peters is a 81 y.o. female who presents to Primary Care at [facility] complaining of cough and chest congestion. Seen on 8/11 by Dr. Katrinka Blazing with cough and laryngitis x 3 days. Suspected virus, codeine cough syrup prescribed, voice rest, Claritin and fluids advised.   Pt states laryngitis improved after 4-5 days and cough worsened prior to improving. States cough has gradually improved over the past day although she has occasional coughing spells. She expresses that her main concern is that she is going to [place] in 3 days and does not want to get her family sick. Pt is experiencing some postnasal drip at night at sore throat at night only that she attributes to postnasal drip. She has tried Ricola, Mucinex during the day which she states has not provided significant relief, hydrocodone which was a year out of date and codeine cough syrup at night. Denies fever, sob, sinus pressure/pain, sick contacts prior to onset of symptoms.  Patient Active Problem List   Diagnosis Date Noted  . PHN (postherpetic neuralgia) 06/25/2013   Past Medical History:  Diagnosis Date  . Cataract   . Neuromuscular disorder [facility])    Past Surgical History:  Procedure Laterality Date  . CHOLECYSTECTOMY    . EYE SURGERY     Allergies  Allergen Reactions  . Ciprofloxacin   . Sulfa Antibiotics Other (See Comments)    dizzy   Prior to Admission medications   Medication Sig Start Date End Date Taking? Authorizing Provider  diclofenac sodium (VOLTAREN) 1 % GEL Apply 4 g topically 4 (four)  times daily. 06/04/16   Sherren Mocha, MD  guaiFENesin-codeine 100-10 MG/5ML syrup Take 5 mLs by mouth every 6 (six) hours as needed for cough. 03/28/17   Ethelda Chick, MD  ibuprofen (ADVIL,MOTRIN) 800 MG tablet Take 1 tablet (800 mg total) by mouth every 8 (eight) hours as needed. 05/23/16   Sherren Mocha, MD  lidocaine (LIDODERM) 5 % Place 1 patch onto the skin daily. Remove & Discard patch within 12 hours or as directed by MD 04/16/13   Elvina Sidle, MD   Social History   Social History  . Marital status: Married    Spouse name: N/A  . Number of children: N/A  . Years of education: N/A   Occupational History  . Not on file.   Social History Main Topics  . Smoking status: Never Smoker  . Smokeless tobacco: Never Used  . Alcohol use Not on file  . Drug use: Unknown  . Sexual activity: Not on file   Other Topics Concern  . Not on file   Social History Narrative  . No narrative on file   Review of Systems  Constitutional: Negative for fever.  HENT: Positive for postnasal drip and sore throat (intermittent). Negative for sinus pain, sinus pressure and voice change.   Respiratory: Positive for cough. Negative for shortness of breath.      Objective:   Physical Exam  Constitutional: She is oriented to person, place, and time. She appears  well-developed and well-nourished. No distress.  HENT:  Head: Normocephalic and atraumatic.  Right Ear: Hearing, tympanic membrane, external ear and ear canal normal.  Left Ear: Hearing, tympanic membrane, external ear and ear canal normal.  Nose: Nose normal.  Mouth/Throat: Oropharynx is clear and moist. No oropharyngeal exudate.  Eyes: Pupils are equal, round, and reactive to light. Conjunctivae and EOM are normal.  Cardiovascular: Normal rate, regular rhythm, normal heart sounds and intact distal pulses.   No murmur heard. Pulmonary/Chest: Effort normal and breath sounds normal. No respiratory distress. She has no wheezes. She has no  rhonchi.  Few faint coarse breath sounds at the R base.  Neurological: She is alert and oriented to person, place, and time.  Skin: Skin is warm and dry. No rash noted.  Psychiatric: She has a normal mood and affect. Her behavior is normal.  Vitals reviewed.  Vitals:   04/06/17 1742  BP: (!) 164/90  Pulse: 81  Resp: 16  Temp: 97.9 F (36.6 C)  TempSrc: Oral  SpO2: 96%  Weight: 182 lb (82.6 kg)  Height: 5\' 5"  (1.651 m)      Assessment & Plan:    Patricia Peters is a 81 y.o. female LRTI (lower respiratory tract infection) - Plan: azithromycin (ZITHROMAX) 250 MG tablet  Cough Improving symptoms, still some residual cough, few coarse breath sounds at bases of lungs may be atelectasis, less likely pneumonia with normal O2 sat, afebrile, and improving symptoms. Overall still appears to be viral syndrome, and encouraged by continual improvement.  -Continue symptomatic care with Mucinex, codeine cough syrup, but prescription given for azithromycin if she is not continuing to improve in the next few days as she is traveling. RTC/ER precautions discussed if any worsening, including when she is out of town.   Meds ordered this encounter  Medications  . azithromycin (ZITHROMAX) 250 MG tablet    Sig: Take 2 pills by mouth on day 1, then 1 pill by mouth per day on days 2 through 5.    Dispense:  6 tablet    Refill:  0   Patient Instructions    Your symptoms are likely still due to a virus, and reassuring that you are improving. I did hear a small amount of congestion at the bottom of your lungs, but that can be present after viral infections as well. Continue Mucinex as needed for cough, cough syrup at night if needed, make sure you drink plenty of fluids, especially with traveling and going out [place].   If cough does not continue to improve in the next few days, can start antibiotic that was printed tonight. If you run fevers or are worsening, recommend evaluation with urgent care or  other provider locally.   Cough, Adult Coughing is a reflex that clears your throat and your airways. Coughing helps to heal and protect your lungs. It is normal to cough occasionally, but a cough that happens with other symptoms or lasts a long time may be a sign of a condition that needs treatment. A cough may last only 2-3 weeks (acute), or it may last longer than 8 weeks (chronic). What are the causes? Coughing is commonly caused by:  Breathing in substances that irritate your lungs.  A viral or bacterial respiratory infection.  Allergies.  Asthma.  Postnasal drip.  Smoking.  Acid backing up from the stomach into the esophagus (gastroesophageal reflux).  Certain medicines.  Chronic lung problems, including COPD (or rarely, lung cancer).  Other medical conditions such  as heart failure.  Follow these instructions at home: Pay attention to any changes in your symptoms. Take these actions to help with your discomfort:  Take medicines only as told by your health care provider. ? If you were prescribed an antibiotic medicine, take it as told by your health care provider. Do not stop taking the antibiotic even if you start to feel better. ? Talk with your health care provider before you take a cough suppressant medicine.  Drink enough fluid to keep your urine clear or pale yellow.  If the air is dry, use a cold steam vaporizer or humidifier in your bedroom or your home to help loosen secretions.  Avoid anything that causes you to cough at work or at home.  If your cough is worse at night, try sleeping in a semi-upright position.  Avoid cigarette smoke. If you smoke, quit smoking. If you need help quitting, ask your health care provider.  Avoid caffeine.  Avoid alcohol.  Rest as needed.  Contact a health care provider if:  You have new symptoms.  You cough up pus.  Your cough does not get better after 2-3 weeks, or your cough gets worse.  You cannot control your  cough with suppressant medicines and you are losing sleep.  You develop pain that is getting worse or pain that is not controlled with pain medicines.  You have a fever.  You have unexplained weight loss.  You have night sweats. Get help right away if:  You cough up blood.  You have difficulty breathing.  Your heartbeat is very fast. This information is not intended to replace advice given to you by your health care provider. Make sure you discuss any questions you have with your health care provider. Document Released: 01/31/2011 Document Revised: 01/10/2016 Document Reviewed: 10/11/2014 Elsevier Interactive Patient Education  2017 [employer].    IF you received an x-ray today, you will receive an invoice from [facility]. Please contact [facility] at [phone] with questions or concerns regarding your invoice.   IF you received labwork today, you will receive an invoice from [facility]. Please contact [facility] at [phone] with questions or concerns regarding your invoice.   Our billing staff will not be able to assist you with questions regarding bills from these companies.  You will be contacted with the lab results as soon as they are available. The fastest way to get your results is to activate your My Chart account. Instructions are located on the last page of this paperwork. If you have not heard from [place] regarding the results in 2 weeks, please contact this office.       I personally performed the services described in this documentation, which was scribed in my presence. The recorded information has been reviewed and considered for accuracy and completeness, addended by me as needed, and agree with information above.  Signed,   Meredith Staggers, MD Primary Care at [facility].  04/06/17 7:02 PM

## 2017-04-06 NOTE — Patient Instructions (Addendum)
Your symptoms are likely still due to a virus, and reassuring that you are improving. I did hear a small amount of congestion at the bottom of your lungs, but that can be present after viral infections as well. Continue Mucinex as needed for cough, cough syrup at night if needed, make sure you drink plenty of fluids, especially with traveling and going out [place].   If cough does not continue to improve in the next few days, can start antibiotic that was printed tonight. If you run fevers or are worsening, recommend evaluation with urgent care or other provider locally.   Cough, Adult Coughing is a reflex that clears your throat and your airways. Coughing helps to heal and protect your lungs. It is normal to cough occasionally, but a cough that happens with other symptoms or lasts a long time may be a sign of a condition that needs treatment. A cough may last only 2-3 weeks (acute), or it may last longer than 8 weeks (chronic). What are the causes? Coughing is commonly caused by:  Breathing in substances that irritate your lungs.  A viral or bacterial respiratory infection.  Allergies.  Asthma.  Postnasal drip.  Smoking.  Acid backing up from the stomach into the esophagus (gastroesophageal reflux).  Certain medicines.  Chronic lung problems, including COPD (or rarely, lung cancer).  Other medical conditions such as heart failure.  Follow these instructions at home: Pay attention to any changes in your symptoms. Take these actions to help with your discomfort:  Take medicines only as told by your health care provider. ? If you were prescribed an antibiotic medicine, take it as told by your health care provider. Do not stop taking the antibiotic even if you start to feel better. ? Talk with your health care provider before you take a cough suppressant medicine.  Drink enough fluid to keep your urine clear or pale yellow.  If the air is dry, use a cold steam vaporizer or  humidifier in your bedroom or your home to help loosen secretions.  Avoid anything that causes you to cough at work or at home.  If your cough is worse at night, try sleeping in a semi-upright position.  Avoid cigarette smoke. If you smoke, quit smoking. If you need help quitting, ask your health care provider.  Avoid caffeine.  Avoid alcohol.  Rest as needed.  Contact a health care provider if:  You have new symptoms.  You cough up pus.  Your cough does not get better after 2-3 weeks, or your cough gets worse.  You cannot control your cough with suppressant medicines and you are losing sleep.  You develop pain that is getting worse or pain that is not controlled with pain medicines.  You have a fever.  You have unexplained weight loss.  You have night sweats. Get help right away if:  You cough up blood.  You have difficulty breathing.  Your heartbeat is very fast. This information is not intended to replace advice given to you by your health care provider. Make sure you discuss any questions you have with your health care provider. Document Released: 01/31/2011 Document Revised: 01/10/2016 Document Reviewed: 10/11/2014 Elsevier Interactive Patient Education  2017 [employer].    IF you received an x-ray today, you will receive an invoice from [facility]. Please contact [facility] at [phone] with questions or concerns regarding your invoice.   IF you received labwork today, you will receive an invoice from [employer]. Please contact [facility] at  [phone] with questions or concerns regarding your invoice.   Our billing staff will not be able to assist you with questions regarding bills from these companies.  You will be contacted with the lab results as soon as they are available. The fastest way to get your results is to activate your My Chart account. Instructions are located on the last page of this paperwork. If you have not heard from  [place] regarding the results in 2 weeks, please contact this office.

## 2017-04-08 NOTE — Telephone Encounter (Signed)
Spoke with patient she came back in for a follow up and came in on zpack.   States she is feeling better.

## 2017-04-27 ENCOUNTER — Other Ambulatory Visit: Payer: Self-pay | Admitting: Family Medicine

## 2017-04-27 DIAGNOSIS — Z1231 Encounter for screening mammogram for malignant neoplasm of breast: Secondary | ICD-10-CM

## 2017-05-12 ENCOUNTER — Ambulatory Visit
Admission: RE | Admit: 2017-05-12 | Discharge: 2017-05-12 | Disposition: A | Discharge status: Home / Self Care | Payer: Medicare Other | Source: Ambulatory Visit | Attending: Family Medicine | Admitting: Family Medicine

## 2017-05-12 DIAGNOSIS — Z1231 Encounter for screening mammogram for malignant neoplasm of breast: Secondary | ICD-10-CM

## 2018-01-13 ENCOUNTER — Encounter: Payer: Self-pay | Admitting: Family Medicine

## 2019-03-14 ENCOUNTER — Other Ambulatory Visit: Payer: Self-pay | Admitting: Family Medicine

## 2019-03-14 DIAGNOSIS — Z1231 Encounter for screening mammogram for malignant neoplasm of breast: Secondary | ICD-10-CM

## 2019-05-06 ENCOUNTER — Ambulatory Visit
Admission: RE | Admit: 2019-05-06 | Discharge: 2019-05-06 | Disposition: A | Discharge status: Home / Self Care | Payer: Medicare Other | Source: Ambulatory Visit | Attending: Family Medicine | Admitting: Family Medicine

## 2019-05-06 ENCOUNTER — Other Ambulatory Visit: Payer: Self-pay

## 2019-05-06 DIAGNOSIS — Z1231 Encounter for screening mammogram for malignant neoplasm of breast: Secondary | ICD-10-CM

## 2020-05-28 DIAGNOSIS — D649 Anemia, unspecified: Secondary | ICD-10-CM | POA: Diagnosis not present

## 2020-05-28 DIAGNOSIS — R7303 Prediabetes: Secondary | ICD-10-CM | POA: Diagnosis not present

## 2020-05-28 DIAGNOSIS — E7800 Pure hypercholesterolemia, unspecified: Secondary | ICD-10-CM | POA: Diagnosis not present

## 2020-05-28 DIAGNOSIS — Z1211 Encounter for screening for malignant neoplasm of colon: Secondary | ICD-10-CM | POA: Diagnosis not present

## 2020-05-28 DIAGNOSIS — Z0000 Encounter for general adult medical examination without abnormal findings: Secondary | ICD-10-CM | POA: Diagnosis not present

## 2020-05-28 DIAGNOSIS — G479 Sleep disorder, unspecified: Secondary | ICD-10-CM | POA: Diagnosis not present

## 2020-05-28 DIAGNOSIS — B0229 Other postherpetic nervous system involvement: Secondary | ICD-10-CM | POA: Diagnosis not present

## 2020-05-28 DIAGNOSIS — J309 Allergic rhinitis, unspecified: Secondary | ICD-10-CM | POA: Diagnosis not present

## 2020-06-18 DIAGNOSIS — Z1211 Encounter for screening for malignant neoplasm of colon: Secondary | ICD-10-CM | POA: Diagnosis not present

## 2020-07-25 DIAGNOSIS — L720 Epidermal cyst: Secondary | ICD-10-CM | POA: Diagnosis not present

## 2020-07-25 DIAGNOSIS — B078 Other viral warts: Secondary | ICD-10-CM | POA: Diagnosis not present

## 2020-07-25 DIAGNOSIS — D485 Neoplasm of uncertain behavior of skin: Secondary | ICD-10-CM | POA: Diagnosis not present

## 2020-09-13 DIAGNOSIS — H524 Presbyopia: Secondary | ICD-10-CM | POA: Diagnosis not present

## 2020-12-26 DIAGNOSIS — J069 Acute upper respiratory infection, unspecified: Secondary | ICD-10-CM | POA: Diagnosis not present

## 2021-02-16 DIAGNOSIS — L02212 Cutaneous abscess of back [any part, except buttock]: Secondary | ICD-10-CM | POA: Diagnosis not present

## 2021-02-20 ENCOUNTER — Other Ambulatory Visit: Payer: Self-pay | Admitting: Family Medicine

## 2021-02-20 DIAGNOSIS — Z1231 Encounter for screening mammogram for malignant neoplasm of breast: Secondary | ICD-10-CM

## 2021-02-25 DIAGNOSIS — L723 Sebaceous cyst: Secondary | ICD-10-CM | POA: Diagnosis not present

## 2021-02-25 DIAGNOSIS — B354 Tinea corporis: Secondary | ICD-10-CM | POA: Diagnosis not present

## 2021-03-30 DIAGNOSIS — R319 Hematuria, unspecified: Secondary | ICD-10-CM | POA: Diagnosis not present

## 2021-03-30 DIAGNOSIS — N939 Abnormal uterine and vaginal bleeding, unspecified: Secondary | ICD-10-CM | POA: Diagnosis not present

## 2021-04-08 DIAGNOSIS — L720 Epidermal cyst: Secondary | ICD-10-CM | POA: Diagnosis not present

## 2021-04-24 ENCOUNTER — Ambulatory Visit: Payer: Medicare Other

## 2021-04-24 ENCOUNTER — Ambulatory Visit
Admission: RE | Admit: 2021-04-24 | Discharge: 2021-04-24 | Disposition: A | Discharge status: Home / Self Care | Payer: Medicare PPO | Source: Ambulatory Visit | Attending: Family Medicine | Admitting: Family Medicine

## 2021-04-24 ENCOUNTER — Other Ambulatory Visit: Payer: Self-pay

## 2021-04-24 DIAGNOSIS — Z1231 Encounter for screening mammogram for malignant neoplasm of breast: Secondary | ICD-10-CM

## 2021-04-30 DIAGNOSIS — B354 Tinea corporis: Secondary | ICD-10-CM | POA: Diagnosis not present

## 2021-05-29 DIAGNOSIS — B0229 Other postherpetic nervous system involvement: Secondary | ICD-10-CM | POA: Diagnosis not present

## 2021-05-29 DIAGNOSIS — Z1389 Encounter for screening for other disorder: Secondary | ICD-10-CM | POA: Diagnosis not present

## 2021-05-29 DIAGNOSIS — D649 Anemia, unspecified: Secondary | ICD-10-CM | POA: Diagnosis not present

## 2021-05-29 DIAGNOSIS — Z1211 Encounter for screening for malignant neoplasm of colon: Secondary | ICD-10-CM | POA: Diagnosis not present

## 2021-05-29 DIAGNOSIS — Z23 Encounter for immunization: Secondary | ICD-10-CM | POA: Diagnosis not present

## 2021-05-29 DIAGNOSIS — R7303 Prediabetes: Secondary | ICD-10-CM | POA: Diagnosis not present

## 2021-05-29 DIAGNOSIS — Z0000 Encounter for general adult medical examination without abnormal findings: Secondary | ICD-10-CM | POA: Diagnosis not present

## 2021-05-29 DIAGNOSIS — G479 Sleep disorder, unspecified: Secondary | ICD-10-CM | POA: Diagnosis not present

## 2021-05-29 DIAGNOSIS — E7800 Pure hypercholesterolemia, unspecified: Secondary | ICD-10-CM | POA: Diagnosis not present

## 2021-06-14 DIAGNOSIS — Z1211 Encounter for screening for malignant neoplasm of colon: Secondary | ICD-10-CM | POA: Diagnosis not present

## 2021-07-04 DIAGNOSIS — Z01419 Encounter for gynecological examination (general) (routine) without abnormal findings: Secondary | ICD-10-CM | POA: Diagnosis not present

## 2021-08-29 DIAGNOSIS — N950 Postmenopausal bleeding: Secondary | ICD-10-CM | POA: Diagnosis not present

## 2021-10-08 IMAGING — MG MM DIGITAL SCREENING BILAT W/ TOMO AND CAD
6 of 10 series · 6 of 30 positions shown · non-contrast
Comparison: Previous exam(s).

CLINICAL DATA: Screening.

EXAM:
DIGITAL SCREENING BILATERAL MAMMOGRAM WITH TOMOSYNTHESIS AND CAD
TECHNIQUE: Bilateral screening digital craniocaudal and mediolateral oblique
mammograms were obtained. Bilateral screening digital breast
tomosynthesis was performed. The images were evaluated with
computer-aided detection.

[R CC synth-2D]
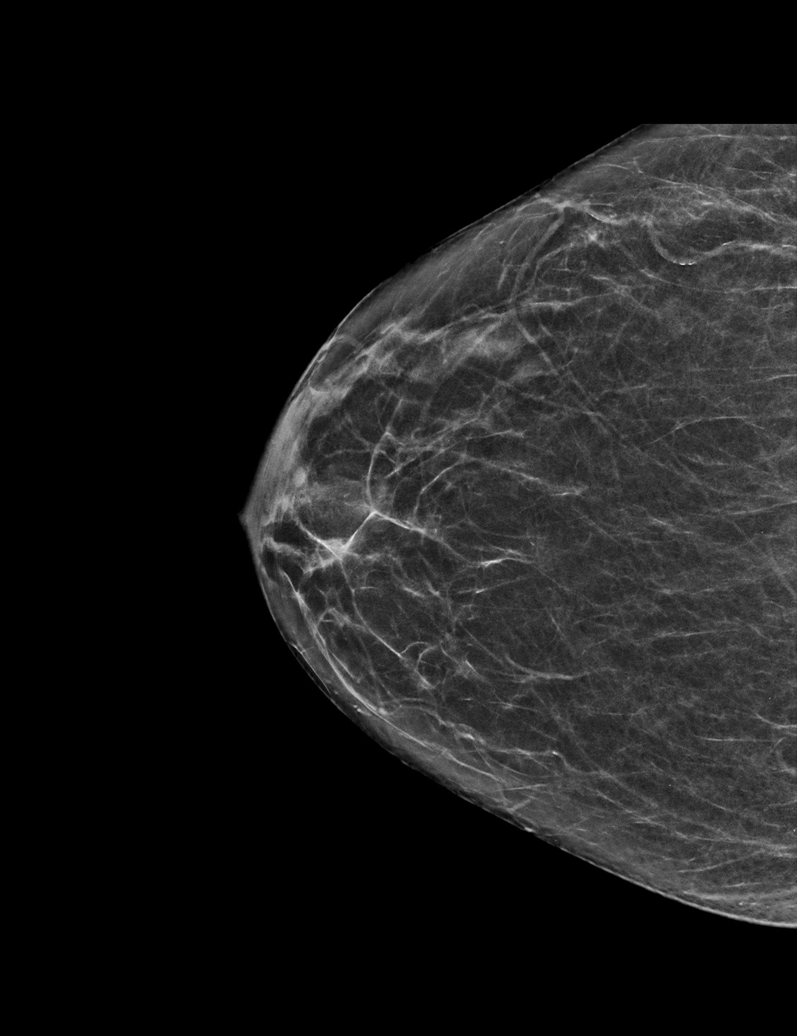

[L MLO synth-2D (1 of 2)]
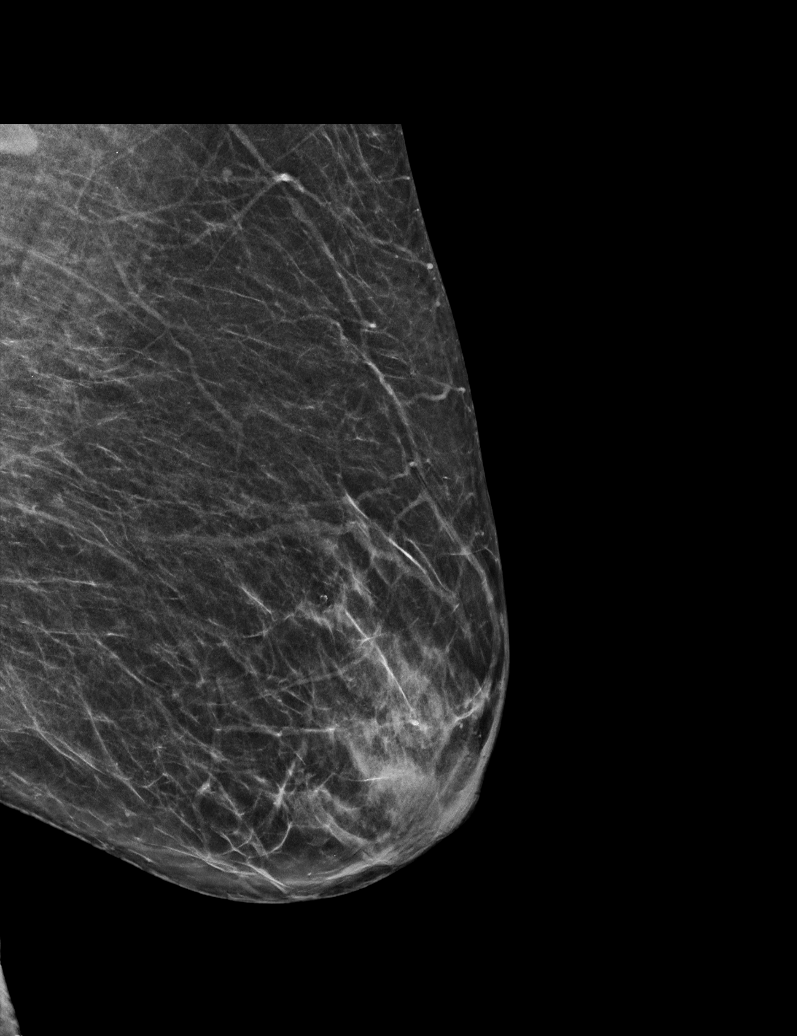

[L CC synth-2D]
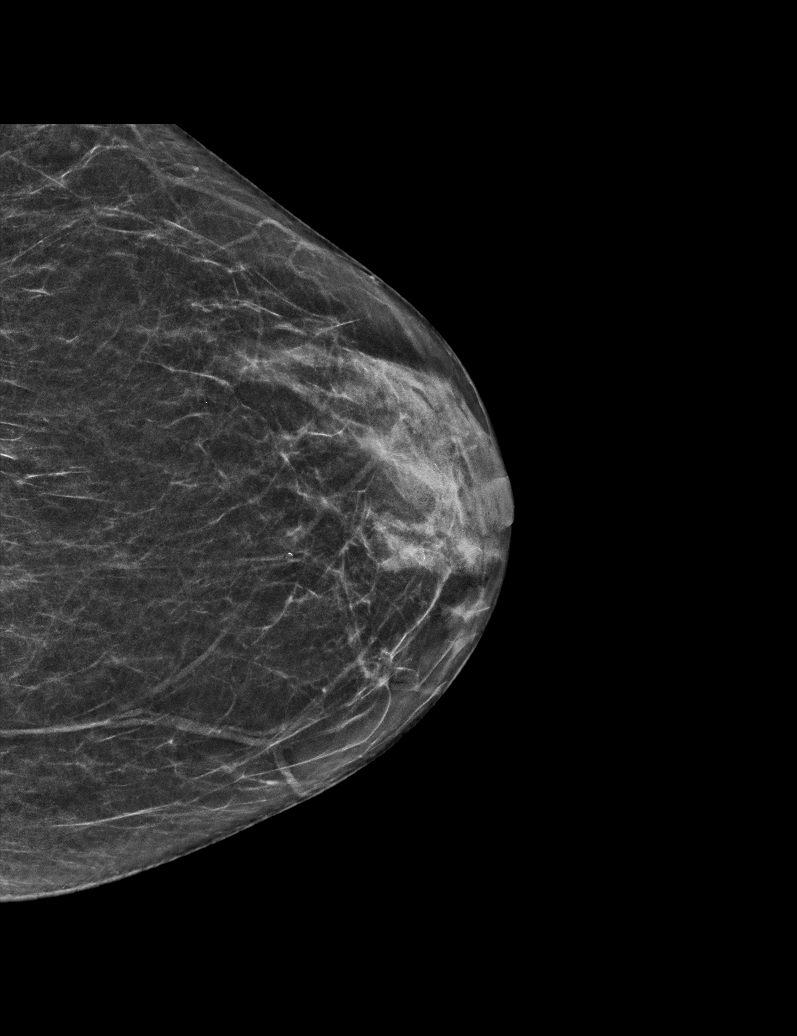

[R MLO synth-2D]
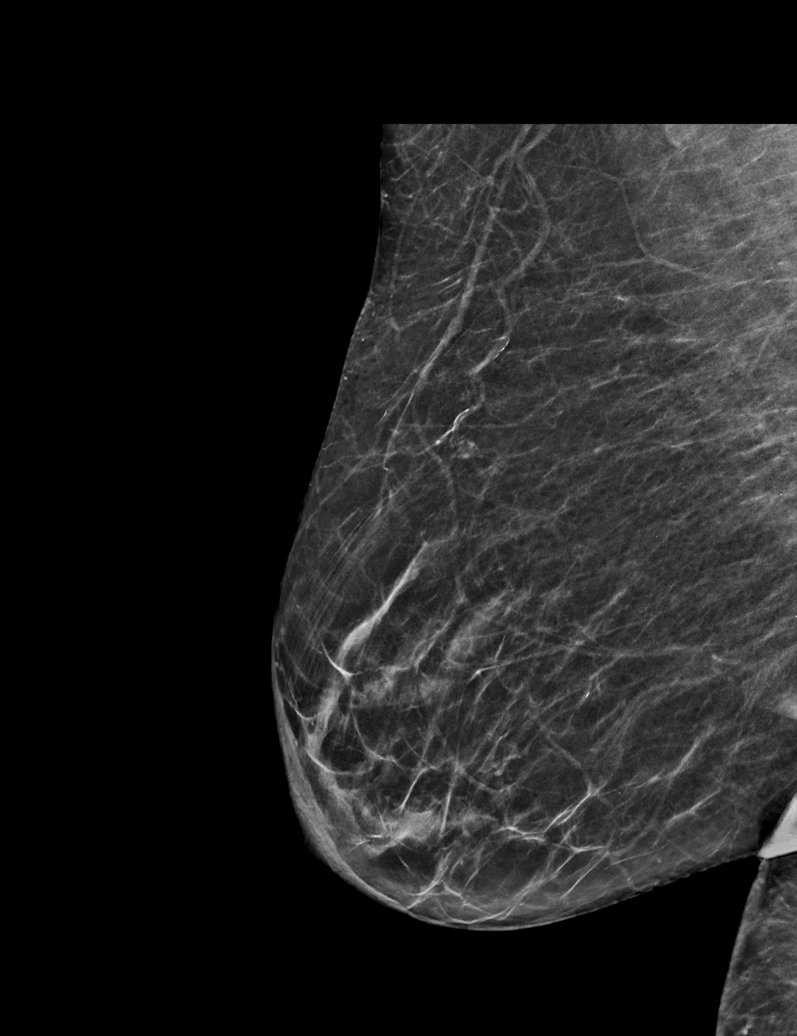

[L MLO synth-2D (2 of 2)]
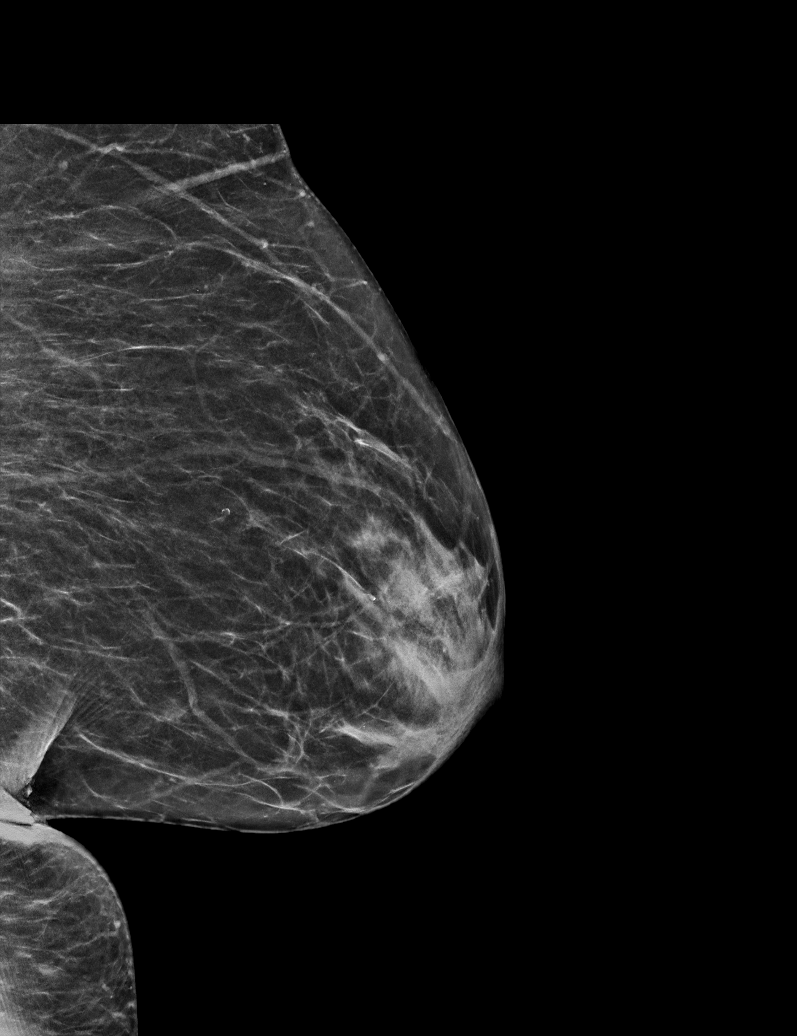

[L CC tomo · tomo slice 23/45.0]
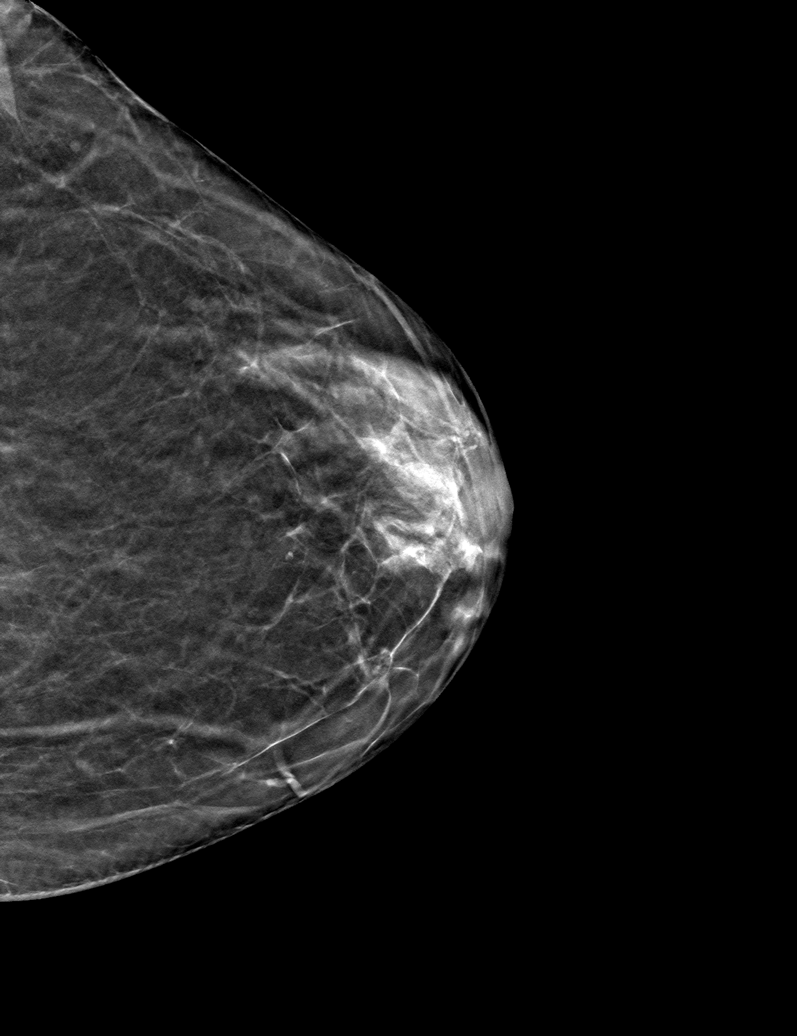

[6 of 30 positions shown; findings below may reference images not displayed]

ACR Breast Density Category b: There are scattered areas of
fibroglandular density.
FINDINGS: There are no findings suspicious for malignancy.
IMPRESSION: No mammographic evidence of malignancy. A result letter of this
screening mammogram will be mailed directly to the patient.

RECOMMENDATION:
Screening mammogram in one year. (Code:51-O-LD2)

BI-RADS CATEGORY  1: Negative.

## 2021-11-30 DIAGNOSIS — J069 Acute upper respiratory infection, unspecified: Secondary | ICD-10-CM | POA: Diagnosis not present

## 2022-04-17 DIAGNOSIS — U071 COVID-19: Secondary | ICD-10-CM | POA: Diagnosis not present

## 2022-04-17 DIAGNOSIS — R059 Cough, unspecified: Secondary | ICD-10-CM | POA: Diagnosis not present

## 2022-05-14 DIAGNOSIS — Z23 Encounter for immunization: Secondary | ICD-10-CM | POA: Diagnosis not present

## 2022-05-14 DIAGNOSIS — B0229 Other postherpetic nervous system involvement: Secondary | ICD-10-CM | POA: Diagnosis not present

## 2022-05-26 DIAGNOSIS — R42 Dizziness and giddiness: Secondary | ICD-10-CM | POA: Diagnosis not present

## 2022-05-26 DIAGNOSIS — F418 Other specified anxiety disorders: Secondary | ICD-10-CM | POA: Diagnosis not present

## 2022-05-26 DIAGNOSIS — I950 Idiopathic hypotension: Secondary | ICD-10-CM | POA: Diagnosis not present

## 2022-05-29 DIAGNOSIS — F4322 Adjustment disorder with anxiety: Secondary | ICD-10-CM | POA: Diagnosis not present

## 2022-06-19 DIAGNOSIS — F4322 Adjustment disorder with anxiety: Secondary | ICD-10-CM | POA: Diagnosis not present

## 2022-06-23 DIAGNOSIS — R7303 Prediabetes: Secondary | ICD-10-CM | POA: Diagnosis not present

## 2022-06-23 DIAGNOSIS — D649 Anemia, unspecified: Secondary | ICD-10-CM | POA: Diagnosis not present

## 2022-06-23 DIAGNOSIS — D696 Thrombocytopenia, unspecified: Secondary | ICD-10-CM | POA: Diagnosis not present

## 2022-06-23 DIAGNOSIS — E7800 Pure hypercholesterolemia, unspecified: Secondary | ICD-10-CM | POA: Diagnosis not present

## 2022-06-23 DIAGNOSIS — F418 Other specified anxiety disorders: Secondary | ICD-10-CM | POA: Diagnosis not present

## 2022-06-23 DIAGNOSIS — G479 Sleep disorder, unspecified: Secondary | ICD-10-CM | POA: Diagnosis not present

## 2022-06-23 DIAGNOSIS — J309 Allergic rhinitis, unspecified: Secondary | ICD-10-CM | POA: Diagnosis not present

## 2022-06-23 DIAGNOSIS — Z0000 Encounter for general adult medical examination without abnormal findings: Secondary | ICD-10-CM | POA: Diagnosis not present

## 2022-06-23 DIAGNOSIS — B0229 Other postherpetic nervous system involvement: Secondary | ICD-10-CM | POA: Diagnosis not present

## 2022-06-25 DIAGNOSIS — Z1211 Encounter for screening for malignant neoplasm of colon: Secondary | ICD-10-CM | POA: Diagnosis not present

## 2022-06-25 DIAGNOSIS — F4322 Adjustment disorder with anxiety: Secondary | ICD-10-CM | POA: Diagnosis not present

## 2022-08-07 DIAGNOSIS — Z01419 Encounter for gynecological examination (general) (routine) without abnormal findings: Secondary | ICD-10-CM | POA: Diagnosis not present

## 2023-01-21 DIAGNOSIS — J069 Acute upper respiratory infection, unspecified: Secondary | ICD-10-CM | POA: Diagnosis not present

## 2023-01-21 DIAGNOSIS — J040 Acute laryngitis: Secondary | ICD-10-CM | POA: Diagnosis not present

## 2023-02-16 DIAGNOSIS — J329 Chronic sinusitis, unspecified: Secondary | ICD-10-CM | POA: Diagnosis not present

## 2023-02-16 DIAGNOSIS — J069 Acute upper respiratory infection, unspecified: Secondary | ICD-10-CM | POA: Diagnosis not present

## 2023-04-07 ENCOUNTER — Other Ambulatory Visit: Payer: Self-pay | Admitting: Internal Medicine

## 2023-04-07 DIAGNOSIS — Z1231 Encounter for screening mammogram for malignant neoplasm of breast: Secondary | ICD-10-CM

## 2023-05-02 ENCOUNTER — Other Ambulatory Visit: Payer: Self-pay

## 2023-05-02 ENCOUNTER — Inpatient Hospital Stay (HOSPITAL_BASED_OUTPATIENT_CLINIC_OR_DEPARTMENT_OTHER)
Admission: EM | Admit: 2023-05-02 | Discharge: 2023-05-15 | DRG: 336 | Disposition: A | Discharge status: Home Health | Payer: Medicare PPO | Attending: Internal Medicine | Admitting: Internal Medicine

## 2023-05-02 ENCOUNTER — Encounter (HOSPITAL_BASED_OUTPATIENT_CLINIC_OR_DEPARTMENT_OTHER): Payer: Self-pay

## 2023-05-02 ENCOUNTER — Emergency Department (HOSPITAL_BASED_OUTPATIENT_CLINIC_OR_DEPARTMENT_OTHER): Payer: Medicare PPO

## 2023-05-02 DIAGNOSIS — K567 Ileus, unspecified: Secondary | ICD-10-CM | POA: Diagnosis present

## 2023-05-02 DIAGNOSIS — E440 Moderate protein-calorie malnutrition: Secondary | ICD-10-CM | POA: Insufficient documentation

## 2023-05-02 DIAGNOSIS — R739 Hyperglycemia, unspecified: Secondary | ICD-10-CM | POA: Diagnosis present

## 2023-05-02 DIAGNOSIS — K219 Gastro-esophageal reflux disease without esophagitis: Secondary | ICD-10-CM | POA: Diagnosis present

## 2023-05-02 DIAGNOSIS — Z6824 Body mass index (BMI) 24.0-24.9, adult: Secondary | ICD-10-CM

## 2023-05-02 DIAGNOSIS — Z881 Allergy status to other antibiotic agents status: Secondary | ICD-10-CM | POA: Diagnosis not present

## 2023-05-02 DIAGNOSIS — R7303 Prediabetes: Secondary | ICD-10-CM | POA: Insufficient documentation

## 2023-05-02 DIAGNOSIS — R188 Other ascites: Secondary | ICD-10-CM | POA: Diagnosis not present

## 2023-05-02 DIAGNOSIS — J90 Pleural effusion, not elsewhere classified: Secondary | ICD-10-CM | POA: Diagnosis present

## 2023-05-02 DIAGNOSIS — K565 Intestinal adhesions [bands], unspecified as to partial versus complete obstruction: Secondary | ICD-10-CM | POA: Diagnosis not present

## 2023-05-02 DIAGNOSIS — Z1152 Encounter for screening for COVID-19: Secondary | ICD-10-CM

## 2023-05-02 DIAGNOSIS — Z9049 Acquired absence of other specified parts of digestive tract: Secondary | ICD-10-CM | POA: Diagnosis not present

## 2023-05-02 DIAGNOSIS — N3000 Acute cystitis without hematuria: Secondary | ICD-10-CM | POA: Diagnosis not present

## 2023-05-02 DIAGNOSIS — R413 Other amnesia: Secondary | ICD-10-CM | POA: Insufficient documentation

## 2023-05-02 DIAGNOSIS — E860 Dehydration: Secondary | ICD-10-CM | POA: Insufficient documentation

## 2023-05-02 DIAGNOSIS — N182 Chronic kidney disease, stage 2 (mild): Secondary | ICD-10-CM | POA: Insufficient documentation

## 2023-05-02 DIAGNOSIS — J09X2 Influenza due to identified novel influenza A virus with other respiratory manifestations: Secondary | ICD-10-CM | POA: Diagnosis not present

## 2023-05-02 DIAGNOSIS — R0902 Hypoxemia: Secondary | ICD-10-CM | POA: Insufficient documentation

## 2023-05-02 DIAGNOSIS — D6959 Other secondary thrombocytopenia: Secondary | ICD-10-CM | POA: Diagnosis present

## 2023-05-02 DIAGNOSIS — K5669 Other partial intestinal obstruction: Secondary | ICD-10-CM | POA: Diagnosis not present

## 2023-05-02 DIAGNOSIS — R059 Cough, unspecified: Secondary | ICD-10-CM | POA: Diagnosis not present

## 2023-05-02 DIAGNOSIS — D696 Thrombocytopenia, unspecified: Secondary | ICD-10-CM

## 2023-05-02 DIAGNOSIS — K5651 Intestinal adhesions [bands], with partial obstruction: Secondary | ICD-10-CM | POA: Diagnosis not present

## 2023-05-02 DIAGNOSIS — F419 Anxiety disorder, unspecified: Secondary | ICD-10-CM | POA: Diagnosis present

## 2023-05-02 DIAGNOSIS — Z751 Person awaiting admission to adequate facility elsewhere: Secondary | ICD-10-CM

## 2023-05-02 DIAGNOSIS — R5383 Other fatigue: Secondary | ICD-10-CM | POA: Diagnosis not present

## 2023-05-02 DIAGNOSIS — R070 Pain in throat: Secondary | ICD-10-CM | POA: Diagnosis present

## 2023-05-02 DIAGNOSIS — Z833 Family history of diabetes mellitus: Secondary | ICD-10-CM | POA: Diagnosis not present

## 2023-05-02 DIAGNOSIS — D631 Anemia in chronic kidney disease: Secondary | ICD-10-CM | POA: Diagnosis not present

## 2023-05-02 DIAGNOSIS — J101 Influenza due to other identified influenza virus with other respiratory manifestations: Secondary | ICD-10-CM | POA: Diagnosis not present

## 2023-05-02 DIAGNOSIS — J309 Allergic rhinitis, unspecified: Secondary | ICD-10-CM | POA: Insufficient documentation

## 2023-05-02 DIAGNOSIS — Z79899 Other long term (current) drug therapy: Secondary | ICD-10-CM

## 2023-05-02 DIAGNOSIS — I517 Cardiomegaly: Secondary | ICD-10-CM | POA: Diagnosis not present

## 2023-05-02 DIAGNOSIS — R053 Chronic cough: Secondary | ICD-10-CM | POA: Diagnosis present

## 2023-05-02 DIAGNOSIS — Z4682 Encounter for fitting and adjustment of non-vascular catheter: Secondary | ICD-10-CM | POA: Diagnosis not present

## 2023-05-02 DIAGNOSIS — K56609 Unspecified intestinal obstruction, unspecified as to partial versus complete obstruction: Secondary | ICD-10-CM | POA: Diagnosis present

## 2023-05-02 DIAGNOSIS — G479 Sleep disorder, unspecified: Secondary | ICD-10-CM | POA: Insufficient documentation

## 2023-05-02 DIAGNOSIS — B0229 Other postherpetic nervous system involvement: Secondary | ICD-10-CM | POA: Diagnosis present

## 2023-05-02 DIAGNOSIS — Z91199 Patient's noncompliance with other medical treatment and regimen due to unspecified reason: Secondary | ICD-10-CM | POA: Diagnosis not present

## 2023-05-02 DIAGNOSIS — E7800 Pure hypercholesterolemia, unspecified: Secondary | ICD-10-CM | POA: Insufficient documentation

## 2023-05-02 DIAGNOSIS — Z800 Family history of malignant neoplasm of digestive organs: Secondary | ICD-10-CM | POA: Diagnosis not present

## 2023-05-02 DIAGNOSIS — I7143 Infrarenal abdominal aortic aneurysm, without rupture: Secondary | ICD-10-CM | POA: Diagnosis not present

## 2023-05-02 DIAGNOSIS — E876 Hypokalemia: Secondary | ICD-10-CM | POA: Diagnosis present

## 2023-05-02 DIAGNOSIS — I700 Atherosclerosis of aorta: Secondary | ICD-10-CM | POA: Diagnosis present

## 2023-05-02 DIAGNOSIS — Z882 Allergy status to sulfonamides status: Secondary | ICD-10-CM

## 2023-05-02 DIAGNOSIS — R1084 Generalized abdominal pain: Secondary | ICD-10-CM | POA: Diagnosis not present

## 2023-05-02 DIAGNOSIS — R112 Nausea with vomiting, unspecified: Secondary | ICD-10-CM | POA: Diagnosis not present

## 2023-05-02 DIAGNOSIS — R140 Abdominal distension (gaseous): Secondary | ICD-10-CM | POA: Diagnosis not present

## 2023-05-02 DIAGNOSIS — K7689 Other specified diseases of liver: Secondary | ICD-10-CM | POA: Diagnosis not present

## 2023-05-02 DIAGNOSIS — R933 Abnormal findings on diagnostic imaging of other parts of digestive tract: Secondary | ICD-10-CM | POA: Diagnosis not present

## 2023-05-02 DIAGNOSIS — D649 Anemia, unspecified: Secondary | ICD-10-CM | POA: Insufficient documentation

## 2023-05-02 LAB — COMPREHENSIVE METABOLIC PANEL
ALT: 17 U/L (ref 0–44)
AST: 26 U/L (ref 15–41)
Albumin: 3.9 g/dL (ref 3.5–5.0)
Alkaline Phosphatase: 38 U/L (ref 38–126)
Anion gap: 12 (ref 5–15)
BUN: 45 mg/dL — ABNORMAL HIGH (ref 8–23)
CO2: 26 mmol/L (ref 22–32)
Calcium: 9.1 mg/dL (ref 8.9–10.3)
Chloride: 99 mmol/L (ref 98–111)
Creatinine, Ser: 1.17 mg/dL — ABNORMAL HIGH (ref 0.44–1.00)
GFR, Estimated: 46 mL/min — ABNORMAL LOW (ref >60)
Glucose, Bld: 169 mg/dL — ABNORMAL HIGH (ref 70–99)
Potassium: 4.4 mmol/L (ref 3.5–5.1)
Sodium: 137 mmol/L (ref 135–145)
Total Bilirubin: 0.7 mg/dL (ref 0.3–1.2)
Total Protein: 6.6 g/dL (ref 6.5–8.1)

## 2023-05-02 LAB — URINALYSIS, ROUTINE W REFLEX MICROSCOPIC
Bilirubin Urine: NEGATIVE
Glucose, UA: NEGATIVE mg/dL
Hgb urine dipstick: NEGATIVE
Ketones, ur: NEGATIVE mg/dL
Nitrite: NEGATIVE
Protein, ur: 30 mg/dL — AB
Specific Gravity, Urine: 1.026 (ref 1.005–1.030)
WBC, UA: >50 WBC/hpf (ref 0–5)
pH: 5.5 (ref 5.0–8.0)

## 2023-05-02 LAB — CBC
HCT: 43.6 % (ref 36.0–46.0)
Hemoglobin: 14.4 g/dL (ref 12.0–15.0)
MCH: 29.9 pg (ref 26.0–34.0)
MCHC: 33 g/dL (ref 30.0–36.0)
MCV: 90.5 fL (ref 80.0–100.0)
Platelets: 122 10*3/uL — ABNORMAL LOW (ref 150–400)
RBC: 4.82 MIL/uL (ref 3.87–5.11)
RDW: 14.6 % (ref 11.5–15.5)
WBC: 11.2 10*3/uL — ABNORMAL HIGH (ref 4.0–10.5)
nRBC: 0 % (ref 0.0–0.2)

## 2023-05-02 LAB — RESP PANEL BY RT-PCR (RSV, FLU A&B, COVID)  RVPGX2
Influenza A by PCR: POSITIVE — AB
Influenza B by PCR: NEGATIVE
Resp Syncytial Virus by PCR: NEGATIVE
SARS Coronavirus 2 by RT PCR: NEGATIVE

## 2023-05-02 LAB — LACTIC ACID, PLASMA
Lactic Acid, Venous: 1.3 mmol/L (ref 0.5–1.9)
Lactic Acid, Venous: 1.5 mmol/L (ref 0.5–1.9)

## 2023-05-02 LAB — LIPASE, BLOOD: Lipase: <10 U/L — ABNORMAL LOW (ref 11–51)

## 2023-05-02 MED ORDER — ONDANSETRON HCL 4 MG/2ML IJ SOLN
4.0000 mg | Freq: Once | INTRAMUSCULAR | Status: AC
  Administered 2023-05-02: 4 mg via INTRAVENOUS
  Filled 2023-05-02: qty 2

## 2023-05-02 MED ORDER — OSELTAMIVIR PHOSPHATE 30 MG PO CAPS
30.0000 mg | ORAL_CAPSULE | Freq: Two times a day (BID) | ORAL | Status: AC
  Administered 2023-05-02 – 2023-05-07 (×10): 30 mg via ORAL
  Filled 2023-05-02 (×10): qty 1

## 2023-05-02 MED ORDER — ACETAMINOPHEN 650 MG RE SUPP: 650.0000 mg | Freq: Four times a day (QID) | RECTAL | Status: DC | PRN

## 2023-05-02 MED ORDER — IOHEXOL 300 MG/ML  SOLN
100.0000 mL | Freq: Once | INTRAMUSCULAR | Status: AC | PRN
  Administered 2023-05-02: 70 mL via INTRAVENOUS

## 2023-05-02 MED ORDER — DIATRIZOATE MEGLUMINE & SODIUM 66-10 % PO SOLN
90.0000 mL | Freq: Once | ORAL | Status: AC
  Administered 2023-05-03: 90 mL via NASOGASTRIC
  Filled 2023-05-02: qty 90

## 2023-05-02 MED ORDER — PHENOL 1.4 % MT LIQD
2.0000 | OROMUCOSAL | Status: DC | PRN
  Administered 2023-05-06: 2 via OROMUCOSAL
  Filled 2023-05-02: qty 177

## 2023-05-02 MED ORDER — SODIUM CHLORIDE 0.9 % IV BOLUS
1000.0000 mL | Freq: Once | INTRAVENOUS | Status: AC
  Administered 2023-05-02: 1000 mL via INTRAVENOUS

## 2023-05-02 MED ORDER — ONDANSETRON HCL 4 MG/2ML IJ SOLN
4.0000 mg | Freq: Four times a day (QID) | INTRAMUSCULAR | Status: DC | PRN
  Administered 2023-05-08 – 2023-05-14 (×2): 4 mg via INTRAVENOUS
  Filled 2023-05-02 (×2): qty 2

## 2023-05-02 MED ORDER — PROCHLORPERAZINE EDISYLATE 10 MG/2ML IJ SOLN
5.0000 mg | INTRAMUSCULAR | Status: DC | PRN
  Administered 2023-05-08: 10 mg via INTRAVENOUS
  Filled 2023-05-02: qty 2

## 2023-05-02 MED ORDER — PIPERACILLIN-TAZOBACTAM 3.375 G IVPB
3.3750 g | Freq: Once | INTRAVENOUS | Status: AC
  Administered 2023-05-03: 3.375 g via INTRAVENOUS
  Filled 2023-05-02: qty 50

## 2023-05-02 MED ORDER — METOPROLOL TARTRATE 5 MG/5ML IV SOLN
2.5000 mg | Freq: Four times a day (QID) | INTRAVENOUS | Status: DC | PRN
  Administered 2023-05-05 – 2023-05-06 (×3): 5 mg via INTRAVENOUS
  Filled 2023-05-02 (×3): qty 5

## 2023-05-02 MED ORDER — ALUM & MAG HYDROXIDE-SIMETH 200-200-20 MG/5ML PO SUSP: 30.0000 mL | Freq: Four times a day (QID) | ORAL | Status: DC | PRN

## 2023-05-02 MED ORDER — METOPROLOL TARTRATE 5 MG/5ML IV SOLN: 5.0000 mg | Freq: Four times a day (QID) | INTRAVENOUS | Status: DC | PRN

## 2023-05-02 MED ORDER — MAGIC MOUTHWASH: 15.0000 mL | Freq: Four times a day (QID) | ORAL | Status: DC | PRN

## 2023-05-02 MED ORDER — SODIUM CHLORIDE 0.9 % IV SOLN: INTRAVENOUS | Status: AC

## 2023-05-02 MED ORDER — BISACODYL 10 MG RE SUPP
10.0000 mg | Freq: Every day | RECTAL | Status: DC
  Administered 2023-05-04 – 2023-05-10 (×5): 10 mg via RECTAL
  Filled 2023-05-02 (×11): qty 1

## 2023-05-02 MED ORDER — SODIUM CHLORIDE 0.9 % IV SOLN: 8.0000 mg | Freq: Four times a day (QID) | INTRAVENOUS | Status: DC | PRN

## 2023-05-02 MED ORDER — LACTATED RINGERS IV BOLUS: 1000.0000 mL | Freq: Three times a day (TID) | INTRAVENOUS | Status: DC | PRN

## 2023-05-02 MED ORDER — METHOCARBAMOL 1000 MG/10ML IJ SOLN: 1000.0000 mg | Freq: Four times a day (QID) | INTRAVENOUS | Status: DC | PRN

## 2023-05-02 MED ORDER — PIPERACILLIN-TAZOBACTAM 3.375 G IVPB
3.3750 g | Freq: Three times a day (TID) | INTRAVENOUS | Status: DC
  Administered 2023-05-03 – 2023-05-04 (×4): 3.375 g via INTRAVENOUS
  Filled 2023-05-02 (×4): qty 50

## 2023-05-02 MED ORDER — FENTANYL CITRATE PF 50 MCG/ML IJ SOSY
12.5000 ug | PREFILLED_SYRINGE | INTRAMUSCULAR | Status: DC | PRN
  Administered 2023-05-03: 25 ug via INTRAVENOUS
  Filled 2023-05-02: qty 1

## 2023-05-02 MED ORDER — MORPHINE SULFATE (PF) 4 MG/ML IV SOLN
4.0000 mg | Freq: Once | INTRAVENOUS | Status: AC
  Administered 2023-05-02: 4 mg via INTRAVENOUS
  Filled 2023-05-02: qty 1

## 2023-05-02 MED ORDER — LACTATED RINGERS IV BOLUS: 1000.0000 mL | Freq: Three times a day (TID) | INTRAVENOUS | Status: AC | PRN

## 2023-05-02 MED ORDER — LACTATED RINGERS IV BOLUS
1000.0000 mL | Freq: Once | INTRAVENOUS | Status: AC
  Administered 2023-05-02: 1000 mL via INTRAVENOUS

## 2023-05-02 MED ORDER — SALINE SPRAY 0.65 % NA SOLN: 1.0000 | Freq: Four times a day (QID) | NASAL | Status: DC | PRN

## 2023-05-02 MED ORDER — MENTHOL 3 MG MT LOZG
1.0000 | LOZENGE | OROMUCOSAL | Status: DC | PRN
  Administered 2023-05-03: 3 mg via ORAL
  Filled 2023-05-02 (×2): qty 9

## 2023-05-02 MED ORDER — LACTATED RINGERS IV BOLUS: 1000.0000 mL | Freq: Once | INTRAVENOUS | Status: DC

## 2023-05-02 MED ORDER — NAPHAZOLINE-GLYCERIN 0.012-0.25 % OP SOLN: 1.0000 [drp] | Freq: Four times a day (QID) | OPHTHALMIC | Status: DC | PRN

## 2023-05-02 MED ORDER — SIMETHICONE 40 MG/0.6ML PO SUSP: 80.0000 mg | Freq: Four times a day (QID) | ORAL | Status: DC | PRN

## 2023-05-02 NOTE — ED Notes (Addendum)
Pt was placed on 2 lpm [place] due to oxygen sat in high 80's RA. Pt oxygen sat increased to 95% on O2.

## 2023-05-02 NOTE — ED Notes (Signed)
Called [facility] for transport, bed assignment is ready

## 2023-05-02 NOTE — ED Provider Notes (Signed)
[place] [facility]ENC[facility] DEPARTMENT AT [facility] Provider Note   CSN: [id number] Arrival date & time: 05/02/23  1538     History  Chief Complaint  Patient presents with   Vomiting   Diarrhea    Patricia Peters is a 87 y.o. female.  Patient with remote history of cholecystectomy presents to the emergency department for evaluation of nausea, vomiting, diarrhea and upper abdominal pain ongoing over the past 3 days.  Patient was recently in [place].  Her and her husband were staying with friends in a cabin.  She was drinking tap water.  No stream or lake water ingestion.  She did make some sandwiches for the trip back to [place].  Both her and her husband developed symptoms around the same time.  They have similar symptoms.  No fevers.  Patient has been unable to tolerate solids.  She has been drinking water.  No chest pain or shortness of breath.  No urinary symptoms.  She presents with concern for dehydration.  She also has considerable upper abdominal tenderness today.  Her husband is currently here with a fever as well.       Home Medications Prior to Admission medications   Medication Sig Start Date End Date Taking? Authorizing Provider  azithromycin (ZITHROMAX) 250 MG tablet Take 2 pills by mouth on day 1, then 1 pill by mouth per day on days 2 through 5. 04/06/17   Shade Flood, MD  diclofenac sodium (VOLTAREN) 1 % GEL Apply 4 g topically 4 (four) times daily. 06/04/16   Sherren Mocha, MD  guaiFENesin-codeine 100-10 MG/5ML syrup Take 5 mLs by mouth every 6 (six) hours as needed for cough. 03/28/17   Ethelda Chick, MD  ibuprofen (ADVIL,MOTRIN) 800 MG tablet Take 1 tablet (800 mg total) by mouth every 8 (eight) hours as needed. 05/23/16   Sherren Mocha, MD  lidocaine (LIDODERM) 5 % Place 1 patch onto the skin daily. Remove & Discard patch within 12 hours or as directed by MD 04/16/13   Elvina Sidle, MD      Allergies    Ciprofloxacin and Sulfa antibiotics    Review  of Systems   Review of Systems  Physical Exam Updated Vital Signs BP (!) 156/101 (BP Location: Right Arm)   Pulse (!) 101   Temp 98.6 F (37 C) (Oral)   Resp 20   Ht 5\' 6"  (1.676 m)   Wt 68.9 kg   SpO2 94%   BMI 24.53 kg/m   Physical Exam Vitals and nursing note reviewed.  Constitutional:      General: She is not in acute distress.    Appearance: She is well-developed.  HENT:     Head: Normocephalic and atraumatic.     Right Ear: External ear normal.     Left Ear: External ear normal.     Nose: Nose normal.  Eyes:     Conjunctiva/sclera: Conjunctivae normal.  Cardiovascular:     Rate and Rhythm: Normal rate and regular rhythm.     Heart sounds: No murmur heard. Pulmonary:     Effort: No respiratory distress.     Breath sounds: No wheezing, rhonchi or rales.  Abdominal:     Palpations: Abdomen is soft.     Tenderness: There is abdominal tenderness in the right upper quadrant, epigastric area, periumbilical area and left upper quadrant. There is no guarding or rebound.  Musculoskeletal:     Cervical back: Normal range of motion and neck supple.  Right lower leg: No edema.     Left lower leg: No edema.  Skin:    General: Skin is warm and dry.     Findings: No rash.  Neurological:     General: No focal deficit present.     Mental Status: She is alert. Mental status is at baseline.     Motor: No weakness.  Psychiatric:        Mood and Affect: Mood normal.     ED Results / Procedures / Treatments   Labs (all labs ordered are listed, but only abnormal results are displayed) Labs Reviewed  RESP PANEL BY RT-PCR (RSV, FLU A&B, COVID)  RVPGX2 - Abnormal; Notable for the following components:      Result Value   Influenza A by PCR POSITIVE (*)    All other components within normal limits  LIPASE, BLOOD - Abnormal; Notable for the following components:   Lipase <10 (*)    All other components within normal limits  COMPREHENSIVE METABOLIC PANEL - Abnormal; Notable  for the following components:   Glucose, Bld 169 (*)    BUN 45 (*)    Creatinine, Ser 1.17 (*)    GFR, Estimated 46 (*)    All other components within normal limits  CBC - Abnormal; Notable for the following components:   WBC 11.2 (*)    Platelets 122 (*)    All other components within normal limits  URINALYSIS, ROUTINE W REFLEX MICROSCOPIC - Abnormal; Notable for the following components:   Protein, ur 30 (*)    Leukocytes,Ua LARGE (*)    Bacteria, UA RARE (*)    Non Squamous Epithelial 0-5 (*)    All other components within normal limits  URINE CULTURE  CULTURE, BLOOD (ROUTINE X 2)  CULTURE, BLOOD (ROUTINE X 2)  LACTIC ACID, PLASMA  LACTIC ACID, PLASMA    EKG None  Radiology CT ABDOMEN PELVIS W CONTRAST  Result Date: 05/02/2023 CLINICAL DATA:  Abdominal pain, vomiting, diarrhea EXAM: CT ABDOMEN AND PELVIS WITH CONTRAST TECHNIQUE: Multidetector CT imaging of the abdomen and pelvis was performed using the standard protocol following bolus administration of intravenous contrast. RADIATION DOSE REDUCTION: This exam was performed according to the departmental dose-optimization program which includes automated exposure control, adjustment of the mA and/or kV according to patient size and/or use of iterative reconstruction technique. CONTRAST:  70mL OMNIPAQUE IOHEXOL 300 MG/ML  SOLN COMPARISON:  None Available. FINDINGS: Lower chest: Small right and trace left pleural effusions. Associated right basilar atelectasis. Hepatobiliary: Subcentimeter hepatic cysts. Status post cholecystectomy. Mild central intrahepatic ductal dilatation. Common duct measures 8 mm, within normal limits. Pancreas: Within normal limits. Spleen: Calcified splenic granulomata. Adrenals/Urinary Tract: Adrenal glands are within normal limits. Bilateral renal cysts, including a dominant 6.2 cm simple left lower pole renal cyst (series 2/image 42), benign (Bosniak I). No follow-up is recommended. No hydronephrosis. Bladder  is within normal limits. Stomach/Bowel: Stomach is within normal limits. Multiple dilated loops of small bowel the left mid abdomen, suggesting small bowel obstruction. Multiple decompressed loops in the right mid/lower abdomen (for example, series 6/image 62) in an area of mesenteric stranding/fluid (series 2/image 50), suggesting a possible internal hernia. While an adjacent loop of small bowel is mildly thick-walled (series 2/image 2), there is no pneumatosis. Enhancement/inflammation along the medial cecum (series 2/image 61), without discrete/visualized appendix. Colon is otherwise within normal limits. Vascular/Lymphatic: Saccular outpouching along the right lateral aspect of the infrarenal abdominal aorta (coronal image 55), suggesting a small saccular aneurysm,  measuring up to 2.8 cm in maximal diameter. Atherosclerotic calcifications of the abdominal aorta and branch vessels. No suspicious abdominopelvic lymphadenopathy. Reproductive: Uterus is within normal limits. No adnexal masses. Other: Small to moderate abdominopelvic ascites. No free air. Musculoskeletal: Mild degenerative changes of the thoracic spine. IMPRESSION: Small bowel obstruction with transition in the right mid/lower abdomen. Possible underlying internal hernia, although equivocal. Surgical consultation is suggested. Enhancement/inflammation along the medial cecum, without discrete/visualized appendix. If this patient has had a prior appendectomy, this appearance would be worrisome for cecal mass/cancer. However, in the appropriate clinical setting, acute appendicitis is also possible. Small to moderate abdominopelvic ascites. No pneumatosis or free air. Small right and trace left pleural effusions. 2.8 cm saccular infrarenal abdominal aortic aneurysm. Recommend referral to a vascular specialist.This recommendation follows ACR consensus guidelines: White Paper of the ACR Incidental Findings Committee II on Vascular Findings. J Am Coll  Radiol 2013; 10:789-794. Aortic Atherosclerosis (ICD10-I70.0). Electronically Signed   By: Charline Bills M.D.   On: 05/02/2023 17:41    Procedures Procedures    Medications Ordered in ED Medications  piperacillin-tazobactam (ZOSYN) IVPB 3.375 g (has no administration in time range)  sodium chloride 0.9 % bolus 1,000 mL (1,000 mLs Intravenous New Bag/Given 05/02/23 1618)  morphine (PF) 4 MG/ML injection 4 mg (4 mg Intravenous Given 05/02/23 1618)  ondansetron (ZOFRAN) injection 4 mg (4 mg Intravenous Given 05/02/23 1618)  iohexol (OMNIPAQUE) 300 MG/ML solution 100 mL (70 mLs Intravenous Contrast Given 05/02/23 1713)    ED Course/ Medical Decision Making/ A&P    Patient seen and examined. History obtained directly from patient.   Labs/EKG: Ordered CBC, CMP, lipase, UA, COVID.  Imaging: Will likely need CT imaging  Medications/Fluids: IV fluid bolus, IV morphine, IV Zofran  Most recent vital signs reviewed and are as follows: BP (!) 156/101 (BP Location: Right Arm)   Pulse (!) 101   Temp 98.6 F (37 C) (Oral)   Resp 20   Ht 5\' 6"  (1.676 m)   Wt 68.9 kg   SpO2 94%   BMI 24.53 kg/m   Initial impression: Enteritis, foodborne infection  6:03 PM Reassessment performed. Patient appears stable. No active vomiting.   Labs personally reviewed and interpreted including: CBC with elevated white blood cell count 11.2, normal hemoglobin, platelets slightly low at 122; CMP with minimally elevated creatinine at 1.17, BUN of 45, glucose 169 otherwise unremarkable; lipase less than 10; UA with greater than 50 white cells, clean-catch, otherwise negative.  Added lactate, blood cultures.  Respiratory panel positive for flu A.  Imaging personally visualized and interpreted including: CT of the abdomen pelvis with small bowel obstruction, possible internal hernia, cannot rule out appendicitis, saccular infrarenal aortic abdominal aneurysm.  Reviewed pertinent lab work and imaging with patient  at bedside. Questions answered.   Most current vital signs reviewed and are as follows: BP (!) 156/101 (BP Location: Right Arm)   Pulse (!) 101   Temp 98.6 F (37 C) (Oral)   Resp 20   Ht 5\' 6"  (1.676 m)   Wt 68.9 kg   SpO2 94%   BMI 24.53 kg/m   Plan: Admit  6:09 PM I spoke with Dr. Donell Beers.  Surgery to see patient on arrival.  No preference on the hospital.  Hospitalist paged.  Lactate normal.   6:45 PM Discussed with Dr. Allena Katz, [facility] for admission.   Pt discussed with and seen by Dr. Silverio Lay.  Medical Decision Making Amount and/or Complexity of Data Reviewed Labs: ordered. Radiology: ordered.  Risk Prescription drug management.   For this patient's complaint of abdominal pain, the following conditions were considered on the differential diagnosis: gastritis/PUD, enteritis/duodenitis, appendicitis, cholelithiasis/cholecystitis, cholangitis, pancreatitis, ruptured viscus, colitis, diverticulitis, small/large bowel obstruction, proctitis, cystitis, pyelonephritis, ureteral colic, aortic dissection, aortic aneurysm. In women, ectopic pregnancy, pelvic inflammatory disease, ovarian cysts, and tubo-ovarian abscess were also considered. Atypical chest etiologies were also considered including ACS, PE, and pneumonia.  She has tested positive for influenza A, but also has signs of a bowel obstruction on CT which would explain her recent vomiting and upper abdominal pains.  She will be admitted for surgical consultation and ongoing treatment.  Also pyuria without overt irritative UTI symptoms.  Patient on Zosyn.  Cultures pending.         Final Clinical Impression(s) / ED Diagnoses Final diagnoses:  Nausea and vomiting, unspecified vomiting type  Influenza A  Small bowel obstruction (HCC)  Acute cystitis without hematuria    Rx / DC Orders ED Discharge Orders     None         Renne Crigler, PA-C 05/02/23 1846    Charlynne Pander, MD 05/02/23 2312

## 2023-05-02 NOTE — H&P (Signed)
History and Physical    Patricia Peters ZOX:[id number] DOB: [birth year] DOA: 05/02/2023  PCP: Ollen Bowl, MD  Patient coming from: [facility] ED  Chief Complaint: "Extreme fatigue"  HPI: Patricia Peters is a 87 y.o. female with medical history significant of prior cholecystectomy presented to ED with complaints of upper abdominal pain, nausea, vomiting, and diarrhea.  Patient was initially not hypoxic on arrival to ED.  After receiving opiates for pain, she desatted to high 80s on room air and was placed on 2 L [place].  Afebrile.  Labs notable for WBC 11.2, platelet count 122k, glucose 169, BUN 45, creatinine 1.1, no elevation of lipase or LFTs, influenza A PCR positive, lactic acid normal x 2, blood cultures pending.  UA with large amount of leukocytes and microscopy showing greater than 50 WBCs and rare bacteria.  Urine culture pending.   CT abdomen pelvis with contrast showing: "IMPRESSION: Small bowel obstruction with transition in the right mid/lower abdomen. Possible underlying internal hernia, although equivocal. Surgical consultation is suggested.   Enhancement/inflammation along the medial cecum, without discrete/visualized appendix. If this patient has had a prior appendectomy, this appearance would be worrisome for cecal mass/cancer. However, in the appropriate clinical setting, acute appendicitis is also possible.   Small to moderate abdominopelvic ascites. No pneumatosis or free air. Small right and trace left pleural effusions.   2.8 cm saccular infrarenal abdominal aortic aneurysm. Recommend referral to a vascular specialist.This recommendation follows ACR consensus guidelines: White Paper of the ACR Incidental Findings Committee II on Vascular Findings. J Am Coll Radiol 2013; 10:789-794.   Aortic Atherosclerosis (ICD10-I70.0)."   ED PA discussed the case with Dr. Donell Beers and general surgery will consult.  No NG tube placed as patient was not actively vomiting.  Patient  received morphine, Zofran, Zosyn, and 1 L normal saline in the ED.  Patient states she recently went on a trip to [place] and returned 4 days ago.  The following day she started feeling very poorly.  She started having extreme fatigue and nausea.  Yesterday she had several loose bowel movements and a few episodes of vomiting.  She has not vomited today.  She had some periumbilical abdominal pain yesterday but no longer having any pain now.  Patient states she had "twisted intestines" back in 2007 and had surgery done and they also removed her gallbladder.  [place].  She denies cough, shortness of breath, or chest pain.  Denies any urinary symptoms.   Review of Systems:  Review of Systems  All other systems reviewed and are negative.   Past Medical History:  Diagnosis Date   Cataract    Neuromuscular disorder [facility])     Past Surgical History:  Procedure Laterality Date   CHOLECYSTECTOMY     EYE SURGERY       reports that she has never smoked. She has never used smokeless tobacco. No history on file for alcohol use and drug use.  Allergies  Allergen Reactions   Ciprofloxacin    Sulfa Antibiotics Other (See Comments)    dizzy    Family History  Problem Relation Age of Onset   Diabetes Mother    Cancer Father    Cancer Maternal Grandmother    Breast cancer Neg Hx     Prior to Admission medications   Medication Sig Start Date End Date Taking? Authorizing Provider  azithromycin (ZITHROMAX) 250 MG tablet Take 2 pills by mouth on day 1, then  1 pill by mouth per day on days 2 through 5. 04/06/17   Shade Flood, MD  diclofenac sodium (VOLTAREN) 1 % GEL Apply 4 g topically 4 (four) times daily. 06/04/16   Sherren Mocha, MD  guaiFENesin-codeine 100-10 MG/5ML syrup Take 5 mLs by mouth every 6 (six) hours as needed for cough. 03/28/17   Ethelda Chick, MD  ibuprofen (ADVIL,MOTRIN) 800 MG tablet Take 1 tablet (800 mg total) by mouth every 8 (eight)  hours as needed. 05/23/16   Sherren Mocha, MD  lidocaine (LIDODERM) 5 % Place 1 patch onto the skin daily. Remove & Discard patch within 12 hours or as directed by MD 04/16/13   Elvina Sidle, MD    Physical Exam: Vitals:   05/02/23 1546 05/02/23 1547 05/02/23 1930 05/02/23 2025  BP:  (!) 156/101 135/81 (!) 136/98  Pulse:  (!) 101 94 95  Resp:  20 17 17   Temp:  98.6 F (37 C)  98.4 F (36.9 C)  TempSrc:  Oral  Oral  SpO2:  94% 91% 95%  Weight: 68.9 kg     Height: 5\' 6"  (1.676 m)       Physical Exam Vitals reviewed.  Constitutional:      General: She is not in acute distress.    Appearance: She is ill-appearing.  HENT:     Head: Normocephalic and atraumatic.  Eyes:     Extraocular Movements: Extraocular movements intact.  Cardiovascular:     Rate and Rhythm: Normal rate and regular rhythm.     Pulses: Normal pulses.  Pulmonary:     Effort: Pulmonary effort is normal. No respiratory distress.     Breath sounds: No wheezing or rales.  Abdominal:     General: Bowel sounds are normal. There is no distension.     Palpations: Abdomen is soft.     Tenderness: There is no abdominal tenderness. There is no guarding or rebound.  Musculoskeletal:     Cervical back: Normal range of motion.     Right lower leg: No edema.     Left lower leg: No edema.  Skin:    General: Skin is warm and dry.  Neurological:     General: No focal deficit present.     Mental Status: She is alert and oriented to person, place, and time.     Labs on Admission: I have personally reviewed following labs and imaging studies  CBC: Recent Labs  Lab 05/02/23 1621  WBC 11.2*  HGB 14.4  HCT 43.6  MCV 90.5  PLT 122*   Basic Metabolic Panel: Recent Labs  Lab 05/02/23 1621  NA 137  K 4.4  CL 99  CO2 26  GLUCOSE 169*  BUN 45*  CREATININE 1.17*  CALCIUM 9.1   GFR: Estimated Creatinine Clearance: 32.9 mL/min (A) (by C-G formula based on SCr of 1.17 mg/dL (H)). Liver Function Tests: Recent  Labs  Lab 05/02/23 1621  AST 26  ALT 17  ALKPHOS 38  BILITOT 0.7  PROT 6.6  ALBUMIN 3.9   Recent Labs  Lab 05/02/23 1621  LIPASE <10*   No results for input(s): "AMMONIA" in the last 168 hours. Coagulation Profile: No results for input(s): "INR", "PROTIME" in the last 168 hours. Cardiac Enzymes: No results for input(s): "CKTOTAL", "CKMB", "CKMBINDEX", "TROPONINI" in the last 168 hours. BNP (last 3 results) No results for input(s): "PROBNP" in the last 8760 hours. HbA1C: No results for input(s): "HGBA1C" in the last 72 hours. CBG: No  results for input(s): "GLUCAP" in the last 168 hours. Lipid Profile: No results for input(s): "CHOL", "HDL", "LDLCALC", "TRIG", "CHOLHDL", "LDLDIRECT" in the last 72 hours. Thyroid Function Tests: No results for input(s): "TSH", "T4TOTAL", "FREET4", "T3FREE", "THYROIDAB" in the last 72 hours. Anemia Panel: No results for input(s): "VITAMINB12", "FOLATE", "FERRITIN", "TIBC", "IRON", "RETICCTPCT" in the last 72 hours. Urine analysis:    Component Value Date/Time   COLORURINE YELLOW 05/02/2023 1705   APPEARANCEUR CLEAR 05/02/2023 1705   LABSPEC 1.026 05/02/2023 1705   PHURINE 5.5 05/02/2023 1705   GLUCOSEU NEGATIVE 05/02/2023 1705   HGBUR NEGATIVE 05/02/2023 1705   BILIRUBINUR NEGATIVE 05/02/2023 1705   KETONESUR NEGATIVE 05/02/2023 1705   PROTEINUR 30 (A) 05/02/2023 1705   NITRITE NEGATIVE 05/02/2023 1705   LEUKOCYTESUR LARGE (A) 05/02/2023 1705    Radiological Exams on Admission: CT ABDOMEN PELVIS W CONTRAST  Result Date: 05/02/2023 CLINICAL DATA:  Abdominal pain, vomiting, diarrhea EXAM: CT ABDOMEN AND PELVIS WITH CONTRAST TECHNIQUE: Multidetector CT imaging of the abdomen and pelvis was performed using the standard protocol following bolus administration of intravenous contrast. RADIATION DOSE REDUCTION: This exam was performed according to the departmental dose-optimization program which includes automated exposure control, adjustment  of the mA and/or kV according to patient size and/or use of iterative reconstruction technique. CONTRAST:  70mL OMNIPAQUE IOHEXOL 300 MG/ML  SOLN COMPARISON:  None Available. FINDINGS: Lower chest: Small right and trace left pleural effusions. Associated right basilar atelectasis. Hepatobiliary: Subcentimeter hepatic cysts. Status post cholecystectomy. Mild central intrahepatic ductal dilatation. Common duct measures 8 mm, within normal limits. Pancreas: Within normal limits. Spleen: Calcified splenic granulomata. Adrenals/Urinary Tract: Adrenal glands are within normal limits. Bilateral renal cysts, including a dominant 6.2 cm simple left lower pole renal cyst (series 2/image 42), benign (Bosniak I). No follow-up is recommended. No hydronephrosis. Bladder is within normal limits. Stomach/Bowel: Stomach is within normal limits. Multiple dilated loops of small bowel the left mid abdomen, suggesting small bowel obstruction. Multiple decompressed loops in the right mid/lower abdomen (for example, series 6/image 62) in an area of mesenteric stranding/fluid (series 2/image 50), suggesting a possible internal hernia. While an adjacent loop of small bowel is mildly thick-walled (series 2/image 2), there is no pneumatosis. Enhancement/inflammation along the medial cecum (series 2/image 61), without discrete/visualized appendix. Colon is otherwise within normal limits. Vascular/Lymphatic: Saccular outpouching along the right lateral aspect of the infrarenal abdominal aorta (coronal image 55), suggesting a small saccular aneurysm, measuring up to 2.8 cm in maximal diameter. Atherosclerotic calcifications of the abdominal aorta and branch vessels. No suspicious abdominopelvic lymphadenopathy. Reproductive: Uterus is within normal limits. No adnexal masses. Other: Small to moderate abdominopelvic ascites. No free air. Musculoskeletal: Mild degenerative changes of the thoracic spine. IMPRESSION: Small bowel obstruction with  transition in the right mid/lower abdomen. Possible underlying internal hernia, although equivocal. Surgical consultation is suggested. Enhancement/inflammation along the medial cecum, without discrete/visualized appendix. If this patient has had a prior appendectomy, this appearance would be worrisome for cecal mass/cancer. However, in the appropriate clinical setting, acute appendicitis is also possible. Small to moderate abdominopelvic ascites. No pneumatosis or free air. Small right and trace left pleural effusions. 2.8 cm saccular infrarenal abdominal aortic aneurysm. Recommend referral to a vascular specialist.This recommendation follows ACR consensus guidelines: White Paper of the ACR Incidental Findings Committee II on Vascular Findings. J Am Coll Radiol 2013; 10:789-794. Aortic Atherosclerosis (ICD10-I70.0). Electronically Signed   By: Charline Bills M.D.   On: 05/02/2023 17:41    EKG: Pending at this time.  Assessment and Plan  SBO ?Acute appendicitis versus cecal mass Patient with history of prior cholecystectomy presenting with complaints of nausea, vomiting, periumbilical abdominal pain, and loose stools.  Labs showing borderline leukocytosis.  No fever or lactic acidosis.  No signs of sepsis at this time.  No elevation of lipase or LFTs.  CT showing SBO with transition in the right mid/lower abdomen; possible underlying internal hernia although equivocal.  Also showing enhancement/inflammation along the medial cecum without discrete/visualized appendix.  This finding is worrisome for acute appendicitis versus possible cecal mass/cancer.  General surgery consulted.  No NG tube placed in the ED as patient is not actively vomiting.  Patient made n.p.o. except ice chips by general surgery.  Continue Zosyn, gentle IV fluid hydration, pain management, and antiemetic as needed.  Trend WBC count.  Blood cultures pending.   Influenza A infection Patient was satting 94% on room air on arrival to  the ED.  She later desatted to high 80s on room air after receiving IV opiate for pain.  Currently stable on 2 L [place].  Lower chest visualized on CT abdomen pelvis without evidence of pneumonia.  She is endorsing extreme fatigue and GI symptoms as mentioned above.  Recently returned from a trip to [place].  Start 5-day course of Tamiflu.   ?UTI UA with evidence of pyuria and bacteriuria.  Patient is not endorsing any urinary symptoms.  Already started on Zosyn as mentioned above.  Urine culture pending.   Mild thrombocytopenia No signs of bleeding.  Continue to monitor CBC.   Hyperglycemia Glucose 169 and no documented history of diabetes or previous A1c results in the chart.  Check A1c.  CBG checks every 6 hours.   Dehydration BUN elevated.  Continue gentle IV fluid hydration.   Small right and trace left pleural effusions Seen on CT.  No documented history of heart failure or previous echo results in the chart.  Check BNP.   Small to moderate abdominopelvic ascites Seen on CT.  No documented history of cirrhosis or heart failure.   Incidental AAA CT showing a 2.8 cm saccular infrarenal abdominal aortic aneurysm.  Patient will need outpatient evaluation by vascular surgery.  DVT prophylaxis: SCDs Code Status: Full Code (discussed with the patient) Consults called: General Surgery Level of care: Med-Surg Admission status: It is my clinical opinion that admission to INPATIENT is reasonable and necessary because of the expectation that this patient will require hospital care that crosses at least 2 midnights to treat this condition based on the medical complexity of the problems presented.  Given the aforementioned information, the predictability of an adverse outcome is felt to be significant.  John Giovanni MD [facility]  If 7PM-7AM, please contact night-coverage [email or web address]  05/02/2023, 9:32 PM

## 2023-05-02 NOTE — ED Triage Notes (Signed)
Patient arrives with complaints of vomiting, abdominal pain, and diarrhea x3 days. Patient was referred here from Telehealth due to concerns of dehydration.  Rates abdominal pain a 5/10.

## 2023-05-02 NOTE — Consult Note (Addendum)
Patricia Peters  [birth year] [id number]  CARE TEAM:  PCP: Patricia Bowl, MD  Outpatient Care Team: Patient Care Team: Patricia Bowl, MD as PCP - General (Internal Medicine)  Inpatient Treatment Team: Treatment Team:  Patricia Giovanni, MD Patricia Hopping, MD Patricia Peters, Ngumabih, RN [facility], Md, MD Patricia Peters, Patricia Peters, [place]   This patient is a 87 y.o.female who presents today for surgical evaluation at the request of Dr Patricia Peters    Chief complaint / Reason for evaluation: SBO  Elderly woman relatively healthy and active.  Travels a lot.  Looks like she travels to [place], [place] and most recently y [place].  Was having some abdominal pain and nausea while she was on a trip in a cabin up there.  Felt sick.  Driving home has been did not feel well either.  Persistent nausea vomiting.  Also some diarrhea.  Crampy abdominal pain.  Lasting several days.  Tried to adjust diet but could not tolerate solids.  Trying to mainly do water.  Some liquids.  Given concerns went to emergency department [facility].  Dehydration with hyperglycemia.  Hypoxic with saturation in the 80s requiring oxygen.  CT scan as noted below shows dilated bowel and possible cecal mass.  Suspicion for obstruction.  Last colonoscopy 2005.  Currently fecal occult blood test negative in 2022 according to primary care records only definitive abdominal surgery with cholecystectomy surgical consultation requested.  Medicine admitted to [facility] Has some occasional anxiety with flying.  Never smoked.  Rarely drinks alcohol.  Some no anemia.  Has some postherpetic neuralgia use control lidocaine patch.  No narcotics.  Assessment  Patricia Peters  87 y.o. female       Problem List:  Principal Problem:   SBO (small bowel obstruction) (HCC) Active Problems:   Anxiety   Influenza due to identified novel influenza A virus with other respiratory manifestations   Infrarenal abdominal aortic aneurysm (AAA) without rupture  (HCC)   Hyperglycemia   Dehydration   Hypoxia   Thrombocytopenia (HCC)   CKD (chronic kidney disease) stage 2, GFR 60-89 ml/min   SBO  Possible cecal mass  Plan:  Medical admission.  Got admitted to [facility]. From [facility]  IV fluids.  Nausea and pain control.  Nasogastric tube decompression and small bowel protocol.  She has rather dilated small bowel going down to distal ileum in the right side.  Could be adhesions from her prior cholecystectomy.  I do not sense any closed-loop obstruction or torsion or volvulus.  No pneumatosis or inflammation.    There is irregularity and thickening in the ileocecal region.  I am skeptical that this is appendicitis since there is no stranding in the mesenteric fat and looks like her chronic bowel wall thickening.  With 3 days of symptom , she should  have evidence of leukocytosis fever and CT w perforation with fluid/gas.  No discrete appendix to my view.  I worry this could be a malignancy.  She does not really have definite terminal ileal thickening although some bowel are thickened.  I am skeptical of inflammatory bowel disease/Crohn's.  May be a good idea to consider colonoscopy to evaluate and biopsy.  Do not know when the last time she had 1 butrecords note 2005 per PCP.  However to my view, I do not think the transition zone of the SBO is definitely there.  It seems more like it is in the ileum a  foot more proximally in the right upper  quadrant.  Hard to tell.  I am skeptical that she needs antibiotics for abdominal issues  Nonetheless, there is no need for emergent surgery tonight.  Surgery will help follow.  She is is of advanced age but is not on many medications and may be relatively healthy.  Travels a fair amount.  Would definitely want medical possibly cardiac clearance should she need an operation.  See what [facility] primary service thinks of her performance status and ability to tolerate any operation if needed such as lysis of adhesions,  bowel resection, ileocolonic resection, appendectomy  Influenza A positive with hypoxia.  Oxygen.  Defer to primary service  Possible UTI - urine C&S pending  Mildly elevated hemoglobin A1c suspicious for prediabetes and hyperglycemia.  Defer to primary service.  Mildly dilated infrarenal aortic aneurysm.  Less than 3 cm.  Not surprising in 87 year old.  Blood pressure control and follow-up.  Elevated BUN/creatinine most likely a sign of dehydration.  Rehydration and follow-up.  Do not have any prior labs to know if she has had chronic kidney disease or not but PCP questions mild CKD.  I suspect her renal functions is not great given her advanced age      I reviewed nursing notes, ED provider notes, hospitalist notes, last 24 h vitals and pain scores, last 48 h intake and output, last 24 h labs and trends, and last 24 h imaging results. I have reviewed this patient's available data, including medical history, events of note, test results, etc as part of my evaluation.  A significant portion of that time was spent in counseling.  Care during the described time interval was provided by me.  This care required moderate level of medical decision making.  05/02/2023  Patricia Sportsman, MD, FACS, MASCRS Esophageal, Gastrointestinal & Colorectal Surgery Robotic and Minimally Invasive Surgery  [facility] [address] [place], [place] [address] [phone] Fax [phone] Main  CONTACT INFORMATION: Weekday (9AM-5PM): Call [facility] main office at [phone] Weeknight (5PM-9AM) or Weekend/Holiday: Check EPIC "Web Links" tab & use "AMION" (password " [email or web address]") for General Surgery [facility] coverage  Please, DO NOT use SecureChat  (it is not reliable communication to reach operating surgeons & will lead to a delay in care).   Epic staff messaging available for outptient concerns needing 1-2 business day response.       05/02/2023      Past Medical History:  Diagnosis Date   Cataract    Neuromuscular disorder (HCC)    PHN (postherpetic neuralgia) 06/25/2013    Past Surgical History:  Procedure Laterality Date   CHOLECYSTECTOMY     EYE SURGERY      Social History   Socioeconomic History   Marital status: Married    Spouse name: Not on file   Number of children: Not on file   Years of education: Not on file   Highest education level: Not on file  Occupational History   Not on file  Tobacco Use   Smoking status: Never   Smokeless tobacco: Never  Substance and Sexual Activity   Alcohol use: Not on file   Drug use: Not on file   Sexual activity: Not on file  Other Topics Concern   Not on file  Social History Narrative   Not on file   Social Determinants of Health   Financial Resource Strain: Not on file  Food Insecurity: Not on file  Transportation Needs: Not on file  Physical Activity: Not on file  Stress: Not on file  Social Connections: Not on file  Intimate Partner Violence: Not on file    Family History  Problem Relation Age of Onset   Diabetes Mother    Cancer Father    Cancer Maternal Grandmother    Breast cancer Neg Hx     Current Facility-Administered Medications  Medication Dose Route Frequency Provider Last Rate Last Admin   0.9 %  sodium chloride infusion   Intravenous Continuous Patricia Giovanni, MD       acetaminophen (TYLENOL) suppository 650 mg  650 mg Rectal Q6H PRN Karie Soda, MD       alum & mag hydroxide-simeth (MAALOX/MYLANTA) 200-200-20 MG/5ML suspension 30 mL  30 mL Oral Q6H PRN Karie Soda, MD       bisacodyl (DULCOLAX) suppository 10 mg  10 mg Rectal Daily Karie Soda, MD       diatrizoate meglumine-sodium (GASTROGRAFIN) 66-10 % solution 90 mL  90 mL Per NG tube Once Karie Soda, MD       fentaNYL (SUBLIMAZE) injection 12.5-25 mcg  12.5-25 mcg Intravenous Q2H PRN Karie Soda, MD       lactated ringers bolus 1,000 mL  1,000  mL Intravenous Once Karie Soda, MD       lactated ringers bolus 1,000 mL  1,000 mL Intravenous Q8H PRN Karie Soda, MD       magic mouthwash  15 mL Oral QID PRN Karie Soda, MD       menthol-cetylpyridinium (CEPACOL) lozenge 3 mg  1 lozenge Oral PRN Karie Soda, MD       methocarbamol (ROBAXIN) 1,000 mg in dextrose 5 % 100 mL IVPB  1,000 mg Intravenous Q6H PRN Karie Soda, MD       metoprolol tartrate (LOPRESSOR) injection 2.5-5 mg  2.5-5 mg Intravenous Q6H PRN Karie Soda, MD       naphazoline-glycerin (CLEAR EYES REDNESS) ophth solution 1-2 drop  1-2 drop Both Eyes QID PRN Karie Soda, MD       ondansetron [facility]) injection 4 mg  4 mg Intravenous Q6H PRN Karie Soda, MD       Or   ondansetron (ZOFRAN) 8 mg in sodium chloride 0.9 % 50 mL IVPB  8 mg Intravenous Q6H PRN Karie Soda, MD       oseltamivir (TAMIFLU) capsule 30 mg  30 mg Oral BID Patricia Giovanni, MD       phenol (CHLORASEPTIC) mouth spray 2 spray  2 spray Mouth/Throat PRN Karie Soda, MD       piperacillin-tazobactam (ZOSYN) IVPB 3.375 g  3.375 g Intravenous Once Renne Crigler, PA-C       prochlorperazine (COMPAZINE) injection 5-10 mg  5-10 mg Intravenous Q4H PRN Karie Soda, MD       simethicone (MYLICON) 40 MG/0.6ML suspension 80 mg  80 mg Oral QID PRN Karie Soda, MD       sodium chloride (OCEAN) 0.65 % nasal spray 1-2 spray  1-2 spray Each Nare Q6H PRN Karie Soda, MD         Allergies  Allergen Reactions   Ciprofloxacin    Sulfa Antibiotics Other (See Comments)    dizzy     BP (!) 136/98 (BP Location: Right Arm)   Pulse 95   Temp 98.4 F (36.9 C) (Oral)   Resp 17   Ht 5\' 6"  (1.676 m)   Wt 68.9 kg   SpO2 95%   BMI 24.53 kg/m     Results:  Labs: Results for orders placed or performed during the hospital encounter of 05/02/23 (from the past 48 hour(s))  Resp panel by RT-PCR (RSV, Flu A&B, Covid) Anterior Nasal Swab     Status: Abnormal   Collection Time: 05/02/23  3:48 PM    Specimen: Anterior Nasal Swab  Result Value Ref Range   SARS Coronavirus 2 by RT PCR NEGATIVE NEGATIVE    Comment: (NOTE) SARS-CoV-2 target nucleic acids are NOT DETECTED.  The SARS-CoV-2 RNA is generally detectable in upper respiratory specimens during the acute phase of infection. The lowest concentration of SARS-CoV-2 viral copies this assay can detect is 138 copies/mL. A negative result does not preclude SARS-Cov-2 infection and should not be used as the sole basis for treatment or other patient management decisions. A negative result may occur with  improper specimen collection/handling, submission of specimen other than nasopharyngeal swab, presence of viral mutation(s) within the areas targeted by this assay, and inadequate number of viral copies(<138 copies/mL). A negative result must be combined with clinical observations, patient history, and epidemiological information. The expected result is Negative.  Fact Sheet for Patients:  BloggerCourse.com  Fact Sheet for Healthcare Providers:  SeriousBroker.it  This test is no t yet approved or cleared by the [place] FDA and  has been authorized for detection and/or diagnosis of SARS-CoV-2 by FDA under an Emergency Use Authorization (EUA). This EUA will remain  in effect (meaning this test can be used) for the duration of the COVID-19 declaration under Section 564(b)(1) of the Act, 21 U.S.C.section 360bbb-3(b)(1), unless the authorization is terminated  or revoked sooner.       Influenza A by PCR POSITIVE (A) NEGATIVE   Influenza B by PCR NEGATIVE NEGATIVE    Comment: (NOTE) The Xpert Xpress SARS-CoV-2/FLU/RSV plus assay is intended as an aid in the diagnosis of influenza from Nasopharyngeal swab specimens and should not be used as a sole basis for treatment. Nasal washings and aspirates are unacceptable for Xpert Xpress SARS-CoV-2/FLU/RSV testing.  Fact Sheet for  Patients: BloggerCourse.com  Fact Sheet for Healthcare Providers: SeriousBroker.it  This test is not yet approved or cleared by the [place] FDA and has been authorized for detection and/or diagnosis of SARS-CoV-2 by FDA under an Emergency Use Authorization (EUA). This EUA will remain in effect (meaning this test can be used) for the duration of the COVID-19 declaration under Section 564(b)(1) of the Act, 21 U.S.C. section 360bbb-3(b)(1), unless the authorization is terminated or revoked.     Resp Syncytial Virus by PCR NEGATIVE NEGATIVE    Comment: (NOTE) Fact Sheet for Patients: BloggerCourse.com  Fact Sheet for Healthcare Providers: SeriousBroker.it  This test is not yet approved or cleared by the [place] FDA and has been authorized for detection and/or diagnosis of SARS-CoV-2 by FDA under an Emergency Use Authorization (EUA). This EUA will remain in effect (meaning this test can be used) for the duration of the COVID-19 declaration under Section 564(b)(1) of the Act, 21 U.S.C. section 360bbb-3(b)(1), unless the authorization is terminated or revoked.  Performed at [facility], [address], [place], [place] [address]   Lipase, blood     Status: Abnormal   Collection Time: 05/02/23  4:21 PM  Result Value Ref Range   Lipase <10 (L) 11 - 51 U/L    Comment: Performed at [facility], [address] [place], [place] [address]  Comprehensive metabolic panel     Status: Abnormal   Collection Time: 05/02/23  4:21 PM  Result Value Ref Range   Sodium 137 135 - 145 mmol/L   Potassium 4.4 3.5 - 5.1 mmol/L   Chloride 99 98 - 111 mmol/L   CO2 26 22 - 32 mmol/L   Glucose, Bld 169 (H) 70 - 99 mg/dL    Comment: Glucose reference range applies only to samples taken after fasting for at least 8 hours.   BUN 45 (H) 8 - 23 mg/dL    Creatinine, Ser 1.61 (H) 0.44 - 1.00 mg/dL   Calcium 9.1 8.9 - 09.6 mg/dL   Total Protein 6.6 6.5 - 8.1 g/dL   Albumin 3.9 3.5 - 5.0 g/dL   AST 26 15 - 41 U/L   ALT 17 0 - 44 U/L   Alkaline Phosphatase 38 38 - 126 U/L   Total Bilirubin 0.7 0.3 - 1.2 mg/dL   GFR, Estimated 46 (L) >60 mL/min    Comment: (NOTE) Calculated using the CKD-EPI Creatinine Equation (2021)    Anion gap 12 5 - 15    Comment: Performed at [facility], [address] [place], [place] [address]  CBC     Status: Abnormal   Collection Time: 05/02/23  4:21 PM  Result Value Ref Range   WBC 11.2 (H) 4.0 - 10.5 K/uL   RBC 4.82 3.87 - 5.11 MIL/uL   Hemoglobin 14.4 12.0 - 15.0 g/dL   HCT 98.1 19.1 - 47.8 %   MCV 90.5 80.0 - 100.0 fL   MCH 29.9 26.0 - 34.0 pg   MCHC 33.0 30.0 - 36.0 g/dL   RDW 29.5 62.1 - 30.8 %   Platelets 122 (L) 150 - 400 K/uL    Comment: REPEATED TO VERIFY   nRBC 0.0 0.0 - 0.2 %    Comment: Performed at [facility], [address] [place], [place] [address]  Urinalysis, Routine w reflex microscopic -Urine, Clean Catch     Status: Abnormal   Collection Time: 05/02/23  5:05 PM  Result Value Ref Range   Color, Urine YELLOW YELLOW   APPearance CLEAR CLEAR   Specific Gravity, Urine 1.026 1.005 - 1.030   pH 5.5 5.0 - 8.0   Glucose, UA NEGATIVE NEGATIVE mg/dL   Hgb urine dipstick NEGATIVE NEGATIVE   Bilirubin Urine NEGATIVE NEGATIVE   Ketones, ur NEGATIVE NEGATIVE mg/dL   Protein, ur 30 (A) NEGATIVE mg/dL   Nitrite NEGATIVE NEGATIVE   Leukocytes,Ua LARGE (A) NEGATIVE   RBC / HPF 0-5 0 - 5 RBC/hpf   WBC, UA >50 0 - 5 WBC/hpf   Bacteria, UA RARE (A) NONE SEEN   Squamous Epithelial / HPF 0-5 0 - 5 /HPF   Mucus PRESENT    Hyaline Casts, UA PRESENT    Non Squamous Epithelial 0-5 (A) NONE SEEN    Comment: Performed at [facility], [address], [place], [place] [address]  Lactic acid, plasma     Status: None   Collection Time:  05/02/23  5:42 PM  Result Value Ref Range   Lactic Acid, Venous 1.3 0.5 - 1.9 mmol/L    Comment: Performed at [facility], [address], [place], [place] [address]  Lactic acid, plasma     Status: None   Collection Time: 05/02/23  8:30 PM  Result Value Ref Range   Lactic Acid, Venous 1.5 0.5 - 1.9 mmol/L    Comment: Performed at [facility], [address], [place], [place] [address]    Imaging / Studies: CT ABDOMEN PELVIS  W CONTRAST  Result Date: 05/02/2023 CLINICAL DATA:  Abdominal pain, vomiting, diarrhea EXAM: CT ABDOMEN AND PELVIS WITH CONTRAST TECHNIQUE: Multidetector CT imaging of the abdomen and pelvis was performed using the standard protocol following bolus administration of intravenous contrast. RADIATION DOSE REDUCTION: This exam was performed according to the departmental dose-optimization program which includes automated exposure control, adjustment of the mA and/or kV according to patient size and/or use of iterative reconstruction technique. CONTRAST:  70mL OMNIPAQUE IOHEXOL 300 MG/ML  SOLN COMPARISON:  None Available. FINDINGS: Lower chest: Small right and trace left pleural effusions. Associated right basilar atelectasis. Hepatobiliary: Subcentimeter hepatic cysts. Status post cholecystectomy. Mild central intrahepatic ductal dilatation. Common duct measures 8 mm, within normal limits. Pancreas: Within normal limits. Spleen: Calcified splenic granulomata. Adrenals/Urinary Tract: Adrenal glands are within normal limits. Bilateral renal cysts, including a dominant 6.2 cm simple left lower pole renal cyst (series 2/image 42), benign (Bosniak I). No follow-up is recommended. No hydronephrosis. Bladder is within normal limits. Stomach/Bowel: Stomach is within normal limits. Multiple dilated loops of small bowel the left mid abdomen, suggesting small bowel obstruction. Multiple decompressed loops in the right mid/lower abdomen (for example, series  6/image 62) in an area of mesenteric stranding/fluid (series 2/image 50), suggesting a possible internal hernia. While an adjacent loop of small bowel is mildly thick-walled (series 2/image 2), there is no pneumatosis. Enhancement/inflammation along the medial cecum (series 2/image 61), without discrete/visualized appendix. Colon is otherwise within normal limits. Vascular/Lymphatic: Saccular outpouching along the right lateral aspect of the infrarenal abdominal aorta (coronal image 55), suggesting a small saccular aneurysm, measuring up to 2.8 cm in maximal diameter. Atherosclerotic calcifications of the abdominal aorta and branch vessels. No suspicious abdominopelvic lymphadenopathy. Reproductive: Uterus is within normal limits. No adnexal masses. Other: Small to moderate abdominopelvic ascites. No free air. Musculoskeletal: Mild degenerative changes of the thoracic spine. IMPRESSION: Small bowel obstruction with transition in the right mid/lower abdomen. Possible underlying internal hernia, although equivocal. Surgical consultation is suggested. Enhancement/inflammation along the medial cecum, without discrete/visualized appendix. If this patient has had a prior appendectomy, this appearance would be worrisome for cecal mass/cancer. However, in the appropriate clinical setting, acute appendicitis is also possible. Small to moderate abdominopelvic ascites. No pneumatosis or free air. Small right and trace left pleural effusions. 2.8 cm saccular infrarenal abdominal aortic aneurysm. Recommend referral to a vascular specialist.This recommendation follows ACR consensus guidelines: White Paper of the ACR Incidental Findings Committee II on Vascular Findings. J Am Coll Radiol 2013; 10:789-794. Aortic Atherosclerosis (ICD10-I70.0). Electronically Signed   By: Charline Bills M.D.   On: 05/02/2023 17:41    Medications / Allergies: per chart  Antibiotics: Anti-infectives (From admission, onward)    Start      Dose/Rate Route Frequency Ordered Stop   05/02/23 2200  oseltamivir (TAMIFLU) capsule 30 mg        30 mg Oral 2 times daily 05/02/23 2131 05/07/23 2159   05/02/23 1800  piperacillin-tazobactam (ZOSYN) IVPB 3.375 g        3.375 g 12.5 mL/hr over 240 Minutes Intravenous  Once 05/02/23 1759           Note: Portions of this report may have been transcribed using voice recognition software. Every effort was made to ensure accuracy; however, inadvertent computerized transcription errors may be present.   Any transcriptional errors that result from this process are unintentional.    Patricia Sportsman, MD, FACS, MASCRS Esophageal, Gastrointestinal & Colorectal Surgery Robotic and Minimally Invasive Surgery  [facility] [address] [place], [place] [address] [phone] Fax [phone] Main  CONTACT INFORMATION: Weekday (9AM-5PM): Call [facility] main office at [phone] Weeknight (5PM-9AM) or Weekend/Holiday: Check EPIC "Web Links" tab & use "AMION" (password " [email or web address]") for General Surgery [facility] coverage  Please, DO NOT use SecureChat  (it is not reliable communication to reach operating surgeons & will lead to a delay in care).   Epic staff messaging available for outptient concerns needing 1-2 business day response.       05/02/2023  10:00 PM

## 2023-05-02 NOTE — Progress Notes (Signed)
Plan of Care Note for accepted transfer   Patient: CACI WANSER MRN: [id number]   DOA: 05/02/2023  Facility requesting transfer: [facility] Requesting Provider: Renne Crigler, PA Reason for transfer: Small bowel obstruction Facility course: 87 year old patient without significant medical history who recently returned from a trip to [place].  Has been having 3 days of nausea, vomiting, diarrhea, epigastric abdominal pain.  BP mildly elevated otherwise vital stable.  Labs notable for WBC 11.2, creatinine 1.17.  Influenza A positive.  CT A/P shows SBO with transition in the right mid/lower abdomen, possible underlying internal hernia.  Inflammation/enhancement along the medial cecum without discrete/visualized appendix also noted.  Patient was given 1 L normal saline and IV Zosyn.  EDP spoke with on-call general surgery Dr. Donell Beers who recommended medical admission at either [facility] or [facility] and their team will see in consultation.  Plan of care: The patient is accepted for admission to Med-surg  unit, at [facility] or [facility], first available.  Author: Darreld Mclean, MD 05/02/2023  Check [email or web address] for on-call coverage.  Nursing staff, Please call [facility] Admits & Consults System-Wide number on Amion as soon as patient's arrival, so appropriate admitting provider can evaluate the pt.

## 2023-05-02 NOTE — Progress Notes (Signed)
Pharmacy Antibiotic Note  Patricia Peters is a 87 y.o. female admitted on 05/02/2023 with  short bowel obstruction and acute appendicitis vs cecal mass .  She is also +influenza A without evidence of PNA.  Pharmacy has been consulted for Zosyn dosing.  Scr mildly elevated at 1.17  Plan: -Zosyn 3.375g IV q8h (4 hour infusion). -Also adjust Tamiflu dose to 30mg  PO BID x 5 days for CrCl 30-98ml/min -No dose adjustments anticipated.  Pharmacy will sign off and monitor peripherally via electronic surveillance software for any changes in renal function or micro data.   Height: 5\' 6"  (167.6 cm) Weight: 68.9 kg (152 lb) IBW/kg (Calculated) : 59.3  Temp (24hrs), Avg:98.5 F (36.9 C), Min:98.4 F (36.9 C), Max:98.6 F (37 C)  Recent Labs  Lab 05/02/23 1621 05/02/23 1742 05/02/23 2030  WBC 11.2*  --   --   CREATININE 1.17*  --   --   LATICACIDVEN  --  1.3 1.5    Estimated Creatinine Clearance: 32.9 mL/min (A) (by C-G formula based on SCr of 1.17 mg/dL (H)).    Allergies  Allergen Reactions   Ciprofloxacin    Sulfa Antibiotics Other (See Comments)    dizzy    Antimicrobials this admission: 9/14 Zosyn >>  9/14 Tamiflu >>   Dose adjustments this admission:  Microbiology results: 9/14 BCx:  9/14 UCx:   9/14 Resp PCR: + influenza A  Thank you for allowing pharmacy to be a part of this patient's care.  Junita Push PharmD 05/02/2023 10:05 PM

## 2023-05-03 ENCOUNTER — Inpatient Hospital Stay (HOSPITAL_COMMUNITY): Payer: Medicare PPO

## 2023-05-03 DIAGNOSIS — K56609 Unspecified intestinal obstruction, unspecified as to partial versus complete obstruction: Secondary | ICD-10-CM

## 2023-05-03 LAB — BASIC METABOLIC PANEL
Anion gap: 8 (ref 5–15)
BUN: 48 mg/dL — ABNORMAL HIGH (ref 8–23)
CO2: 25 mmol/L (ref 22–32)
Calcium: 8.5 mg/dL — ABNORMAL LOW (ref 8.9–10.3)
Chloride: 102 mmol/L (ref 98–111)
Creatinine, Ser: 0.99 mg/dL (ref 0.44–1.00)
GFR, Estimated: 56 mL/min — ABNORMAL LOW (ref >60)
Glucose, Bld: 175 mg/dL — ABNORMAL HIGH (ref 70–99)
Potassium: 4.2 mmol/L (ref 3.5–5.1)
Sodium: 135 mmol/L (ref 135–145)

## 2023-05-03 LAB — CBC
HCT: 39 % (ref 36.0–46.0)
Hemoglobin: 12.7 g/dL (ref 12.0–15.0)
MCH: 30.2 pg (ref 26.0–34.0)
MCHC: 32.6 g/dL (ref 30.0–36.0)
MCV: 92.6 fL (ref 80.0–100.0)
Platelets: 110 10*3/uL — ABNORMAL LOW (ref 150–400)
RBC: 4.21 MIL/uL (ref 3.87–5.11)
RDW: 14.6 % (ref 11.5–15.5)
WBC: 8.8 10*3/uL (ref 4.0–10.5)
nRBC: 0 % (ref 0.0–0.2)

## 2023-05-03 LAB — URINE CULTURE

## 2023-05-03 LAB — GLUCOSE, CAPILLARY
Glucose-Capillary: 148 mg/dL — ABNORMAL HIGH (ref 70–99)
Glucose-Capillary: 155 mg/dL — ABNORMAL HIGH (ref 70–99)
Glucose-Capillary: 150 mg/dL — ABNORMAL HIGH (ref 70–99)
Glucose-Capillary: 159 mg/dL — ABNORMAL HIGH (ref 70–99)

## 2023-05-03 LAB — BRAIN NATRIURETIC PEPTIDE: B Natriuretic Peptide: 49.6 pg/mL (ref 0.0–100.0)

## 2023-05-03 MED ORDER — PNEUMOCOCCAL 20-VAL CONJ VACC 0.5 ML IM SUSY
0.5000 mL | PREFILLED_SYRINGE | INTRAMUSCULAR | Status: DC | PRN
  Filled 2023-05-03: qty 0.5

## 2023-05-03 MED ORDER — SODIUM CHLORIDE 0.9 % IV SOLN: INTRAVENOUS | Status: DC

## 2023-05-03 MED ORDER — CARMEX CLASSIC LIP BALM EX OINT
TOPICAL_OINTMENT | CUTANEOUS | Status: DC | PRN
  Filled 2023-05-03: qty 10

## 2023-05-03 NOTE — Plan of Care (Signed)
Problem: Education: Goal: Knowledge of General Education information will improve Description: Including pain rating scale, medication(s)/side effects and non-pharmacologic comfort measures Outcome: Progressing   Problem: Health Behavior/Discharge Planning: Goal: Ability to manage health-related needs will improve Outcome: Progressing   Problem: Clinical Measurements: Goal: Ability to maintain clinical measurements within normal limits will improve Outcome: Progressing Goal: Will remain free from infection Outcome: Progressing Goal: Diagnostic test results will improve Outcome: Progressing Goal: Respiratory complications will improve Outcome: Progressing Goal: Cardiovascular complication will be avoided Outcome: Progressing   Problem: Nutrition: Goal: Adequate nutrition will be maintained Outcome: Progressing   Problem: Activity: Goal: Risk for activity intolerance will decrease Outcome: Progressing   Problem: Coping: Goal: Level of anxiety will decrease Outcome: Progressing   Problem: Elimination: Goal: Will not experience complications related to bowel motility Outcome: Progressing Goal: Will not experience complications related to urinary retention Outcome: Progressing   Problem: Skin Integrity: Goal: Risk for impaired skin integrity will decrease Outcome: Progressing   Problem: Safety: Goal: Ability to remain free from injury will improve Outcome: Progressing

## 2023-05-03 NOTE — Progress Notes (Signed)
Pt initially refusing NG tube overnight and again this am, Dulcolax supp and also gastrograffin. Pt w multiple questions, at one pt states she wants a "second opinion". Mds made aware of above. Later in day returned phone call from pt's daughter, she shared that the family had noticed that over the past 6 mos that the pt's memory was "not what it used to be." Noted that pt cont to ask the same questions over and over. Bed alarm in use.

## 2023-05-03 NOTE — Progress Notes (Signed)
PROGRESS NOTE    Patricia Peters  SAY:[id number] DOB: 10/21/1935 DOA: 05/02/2023 PCP: Ollen Bowl, MD   Brief Narrative: Patricia Peters is a 87 y.o. female with a history of cholecystectomy.  Patient presented secondary to extreme fatigue, upper abdominal pain, nausea, vomiting and diarrhea. Patient was found to have evidence of influenza A infection in addition to SBO with transition point. General surgery consulted with recommendation for conservative management. Patient started on oseltamivir for influenza A treatment.   Assessment and Plan:  Small bowel obstruction Noted on CT scan. Transition point located in the right mid/low abdomen. General surgery consulted with recommendation for conservative management. NG tube ordered to be placed. Zosyn IV started. -General surgery recommendations: NG tube placement, small bowel protocol  Cecal inflammation/enhancement Concern for appendicitis versus cecal mass/cancer. General surgery recommends inpatient colonoscopy. [facility] consult for consideration of inpatient colonoscopy  Influenza A infection Mild symptoms. On room air. No evidence of pneumonia. Patient started on oseltamivir. -Continue oseltamivir and supportive care  Abnormal urinalysis Patient without urinary symptoms. Pyuria and bacteria noted on urinalysis. Urine culture ordered. Likely asymptomatic bacteruria. No treatment needed.  Abdominopelvic ascites Noted on CT scan and measured as small to moderate. Unclear etiology.  Thrombocytopenia Mild. Likely reactive secondary to acute illness.  Hyperglycemia Noted. Hemoglobin A1C ordered this admission.  Dehydration Secondary to nausea/vomiting. IV fluids started. -Restart IV fluids  Bilateral pleural effusions Mild. Noted on CT scan incidentally. Asymptomatic.  Abdominal aortic aneurysm Incidental finding. Aneurysm is infrarenal and measures 2.8 cm. Recommendation for outpatient vascular surgery  follow-up.   DVT prophylaxis: SCDs Code Status:   Code Status: Full Code Family Communication: None at bedside Disposition Plan: Discharge pending improvement in obstruction/general surgery recommendations   Consultants:  General surgery  Procedures:  None  Antimicrobials: Oseltamivir    Subjective: Patient reports some abdominal pain. No bowel movement of flatus.  Objective: BP (!) 140/78 (BP Location: Right Arm)   Pulse 90   Temp 98.4 F (36.9 C) (Oral)   Resp 18   Ht 5\' 6"  (1.676 m)   Wt 68.9 kg   SpO2 90%   BMI 24.53 kg/m   Examination:  General exam: Appears anxious and comfortable Respiratory system: Clear to auscultation. Respiratory effort normal. Cardiovascular system: S1 & S2 heard, RRR. Gastrointestinal system: Abdomen is distended, soft and mildly tender. Normal bowel sounds heard. Central nervous system: Alert and oriented. No focal neurological deficits. Musculoskeletal: No edema. No calf tenderness Psychiatry: Judgement and insight appear normal. Mood & affect appropriate.    Data Reviewed: I have personally reviewed following labs and imaging studies  CBC Lab Results  Component Value Date   WBC 8.8 05/03/2023   RBC 4.21 05/03/2023   HGB 12.7 05/03/2023   HCT 39.0 05/03/2023   MCV 92.6 05/03/2023   MCH 30.2 05/03/2023   PLT 110 (L) 05/03/2023   MCHC 32.6 05/03/2023   RDW 14.6 05/03/2023     Last metabolic panel Lab Results  Component Value Date   NA 135 05/03/2023   K 4.2 05/03/2023   CL 102 05/03/2023   CO2 25 05/03/2023   BUN 48 (H) 05/03/2023   CREATININE 0.99 05/03/2023   GLUCOSE 175 (H) 05/03/2023   GFRNONAA 56 (L) 05/03/2023   CALCIUM 8.5 (L) 05/03/2023   PROT 6.6 05/02/2023   ALBUMIN 3.9 05/02/2023   BILITOT 0.7 05/02/2023   ALKPHOS 38 05/02/2023   AST 26 05/02/2023   ALT 17 05/02/2023   ANIONGAP 8 05/03/2023  GFR: Estimated Creatinine Clearance: 38.9 mL/min (by C-G formula based on SCr of 0.99  mg/dL).  Recent Results (from the past 240 hour(s))  Resp panel by RT-PCR (RSV, Flu A&B, Covid) Anterior Nasal Swab     Status: Abnormal   Collection Time: 05/02/23  3:48 PM   Specimen: Anterior Nasal Swab  Result Value Ref Range Status   SARS Coronavirus 2 by RT PCR NEGATIVE NEGATIVE Final    Comment: (NOTE) SARS-CoV-2 target nucleic acids are NOT DETECTED.  The SARS-CoV-2 RNA is generally detectable in upper respiratory specimens during the acute phase of infection. The lowest concentration of SARS-CoV-2 viral copies this assay can detect is 138 copies/mL. A negative result does not preclude SARS-Cov-2 infection and should not be used as the sole basis for treatment or other patient management decisions. A negative result may occur with  improper specimen collection/handling, submission of specimen other than nasopharyngeal swab, presence of viral mutation(s) within the areas targeted by this assay, and inadequate number of viral copies(<138 copies/mL). A negative result must be combined with clinical observations, patient history, and epidemiological information. The expected result is Negative.  Fact Sheet for Patients:  BloggerCourse.com  Fact Sheet for Healthcare Providers:  SeriousBroker.it  This test is no t yet approved or cleared by the [place] FDA and  has been authorized for detection and/or diagnosis of SARS-CoV-2 by FDA under an Emergency Use Authorization (EUA). This EUA will remain  in effect (meaning this test can be used) for the duration of the COVID-19 declaration under Section 564(b)(1) of the Act, 21 U.S.C.section 360bbb-3(b)(1), unless the authorization is terminated  or revoked sooner.       Influenza A by PCR POSITIVE (A) NEGATIVE Final   Influenza B by PCR NEGATIVE NEGATIVE Final    Comment: (NOTE) The Xpert Xpress SARS-CoV-2/FLU/RSV plus assay is intended as an aid in the diagnosis of  influenza from Nasopharyngeal swab specimens and should not be used as a sole basis for treatment. Nasal washings and aspirates are unacceptable for Xpert Xpress SARS-CoV-2/FLU/RSV testing.  Fact Sheet for Patients: BloggerCourse.com  Fact Sheet for Healthcare Providers: SeriousBroker.it  This test is not yet approved or cleared by the [place] FDA and has been authorized for detection and/or diagnosis of SARS-CoV-2 by FDA under an Emergency Use Authorization (EUA). This EUA will remain in effect (meaning this test can be used) for the duration of the COVID-19 declaration under Section 564(b)(1) of the Act, 21 U.S.C. section 360bbb-3(b)(1), unless the authorization is terminated or revoked.     Resp Syncytial Virus by PCR NEGATIVE NEGATIVE Final    Comment: (NOTE) Fact Sheet for Patients: BloggerCourse.com  Fact Sheet for Healthcare Providers: SeriousBroker.it  This test is not yet approved or cleared by the [place] FDA and has been authorized for detection and/or diagnosis of SARS-CoV-2 by FDA under an Emergency Use Authorization (EUA). This EUA will remain in effect (meaning this test can be used) for the duration of the COVID-19 declaration under Section 564(b)(1) of the Act, 21 U.S.C. section 360bbb-3(b)(1), unless the authorization is terminated or revoked.  Performed at [facility], [address] [place], [place] [address]   Blood culture (routine x 2)     Status: None (Preliminary result)   Collection Time: 05/02/23  8:30 PM   Specimen: BLOOD RIGHT ARM  Result Value Ref Range Status   Specimen Description   Final    BLOOD RIGHT ARM Performed at [facility], [address] [place], [place] [address]    Special Requests   Final    BOTTLES DRAWN AEROBIC AND ANAEROBIC Blood Culture adequate volume Performed at [facility], [address] [place], [place] [address]    Culture PENDING  Incomplete   Report Status PENDING  Incomplete  Blood culture (routine x 2)     Status: None (Preliminary result)   Collection Time: 05/02/23  8:39 PM   Specimen: BLOOD RIGHT ARM  Result Value Ref Range Status   Specimen Description   Final    BLOOD RIGHT ARM Performed at [facility], [address], [place], [place] [address]    Special Requests   Final    BOTTLES DRAWN AEROBIC ONLY Blood Culture adequate volume Performed at [facility], [address] [place], [place] [address]    Culture PENDING  Incomplete   Report Status PENDING  Incomplete      Radiology Studies: CT ABDOMEN PELVIS W CONTRAST  Result Date: 05/02/2023 CLINICAL DATA:  Abdominal pain, vomiting, diarrhea EXAM: CT ABDOMEN AND PELVIS WITH CONTRAST TECHNIQUE: Multidetector CT imaging of the abdomen and pelvis was performed using the standard protocol following bolus administration of intravenous contrast. RADIATION DOSE REDUCTION: This exam was performed according to the departmental dose-optimization program which includes automated exposure control, adjustment of the mA and/or kV according to patient size and/or use of iterative reconstruction technique. CONTRAST:  70mL OMNIPAQUE IOHEXOL 300 MG/ML  SOLN COMPARISON:  None Available. FINDINGS: Lower chest: Small right and trace left pleural effusions. Associated right basilar atelectasis. Hepatobiliary: Subcentimeter hepatic cysts. Status post cholecystectomy. Mild central intrahepatic ductal dilatation. Common duct measures 8 mm, within normal limits. Pancreas: Within normal limits. Spleen: Calcified splenic granulomata. Adrenals/Urinary Tract: Adrenal glands are within normal limits. Bilateral renal cysts, including a dominant 6.2 cm simple left lower pole renal cyst (series 2/image 42), benign (Bosniak I). No follow-up is recommended. No hydronephrosis. Bladder is  within normal limits. Stomach/Bowel: Stomach is within normal limits. Multiple dilated loops of small bowel the left mid abdomen, suggesting small bowel obstruction. Multiple decompressed loops in the right mid/lower abdomen (for example, series 6/image 62) in an area of mesenteric stranding/fluid (series 2/image 50), suggesting a possible internal hernia. While an adjacent loop of small bowel is mildly thick-walled (series 2/image 2), there is no pneumatosis. Enhancement/inflammation along the medial cecum (series 2/image 61), without discrete/visualized appendix. Colon is otherwise within normal limits. Vascular/Lymphatic: Saccular outpouching along the right lateral aspect of the infrarenal abdominal aorta (coronal image 55), suggesting a small saccular aneurysm, measuring up to 2.8 cm in maximal diameter. Atherosclerotic calcifications of the abdominal aorta and branch vessels. No suspicious abdominopelvic lymphadenopathy. Reproductive: Uterus is within normal limits. No adnexal masses. Other: Small to moderate abdominopelvic ascites. No free air. Musculoskeletal: Mild degenerative changes of the thoracic spine. IMPRESSION: Small bowel obstruction with transition in the right mid/lower abdomen. Possible underlying internal hernia, although equivocal. Surgical consultation is suggested. Enhancement/inflammation along the medial cecum, without discrete/visualized appendix. If this patient has had a prior appendectomy, this appearance would be worrisome for cecal mass/cancer. However, in the appropriate clinical setting, acute appendicitis is also possible. Small to moderate abdominopelvic ascites. No pneumatosis or free air. Small right and trace left pleural effusions. 2.8 cm saccular infrarenal abdominal aortic aneurysm. Recommend referral to a vascular specialist.This recommendation follows ACR consensus guidelines: White Paper of the ACR Incidental Findings Committee II on Vascular Findings. J Am Coll Radiol  2013; 10:789-794. Aortic Atherosclerosis (ICD10-I70.0).  Electronically Signed   By: Charline Bills M.D.   On: 05/02/2023 17:41      LOS: 1 day    Jacquelin Hawking, MD [facility] 05/03/2023, 7:40 AM   If 7PM-7AM, please contact night-coverage [email or web address]

## 2023-05-03 NOTE — Progress Notes (Signed)
05/03/2023  Patricia Peters [id number] Dec 29, 1935  CARE TEAM: PCP: Ollen Bowl, MD  Outpatient Care Team: Patient Care Team: Ollen Bowl, MD as PCP - General (Internal Medicine)  Inpatient Treatment Team: Treatment Team:  Narda Bonds, MD Ccs, Md, MD Lilyan Gilford, MD Diona Browner, RN Clydia Llano, RN   Problem List:   Principal Problem:   SBO (small bowel obstruction) [facility]) Active Problems:   Anxiety   Influenza due to identified novel influenza A virus with other respiratory manifestations   Infrarenal abdominal aortic aneurysm (AAA) without rupture (HCC)   Hyperglycemia   Dehydration   Hypoxia   Thrombocytopenia (HCC)   CKD (chronic kidney disease) stage 2, GFR 60-89 ml/min   * No surgery found [facility] Stay = 1 days)     Bowel obstruction with noncompliance to recommendations so far  Plan:  -She is nauseated bloated and miserable.  She does not have peritonitis but her obstruction has not resolved spontaneously as I suspected.  I went over the pathophysiology of bowel obstructions and etiologies.  Noted I am concerned about a mass in her cecum as well.  I recommended that she be compliant with my surgical medical advice.  Allow nursing to place an NG tube and do the small bowel protocol in the hopes we can turn things out around without emergent surgery.  Remains miserable and nauseated.  After going over pathophysiology and answering questions, I think she is more open to doing it now.  She gives a history of what sounds like a sigmoid volvulus when she was traveling in [place] around 2007.  No evidence of any volvulus on CT scan.  I cannot find any records.  She snowbirds down to [facility].  I cannot find the records there.  She often is up near [facility].  Cannot find any records there.    Again I am concerned about a mass in her cecum.  I am skeptical that she has appendicitis at this time but  certainly that is a concern.  I am more worried about a cecal cancer that may be causing partial obstruction versus a separate finding.  I do not think she has had an endoscopy since 2005 and the patient cannot recall the last when she had aside from the what sounds like flex sig in 2007 to untwist sigmoid volvulus. Consider colonoscopy before she gets discharged to make sure she does not have a obstructing mass or tumor at her cecum.  Influenza A.  Respiratory precautions.  Per primary service.  Atypical urinalysis suspicious for UTI.  Culture pending.  I believe they put her on IV Zosyn just in case.  Hyperglycemia per primary service.  VTE prophylaxis- SCDs, etc  mobilize as tolerated to help recovery    I reviewed nursing notes, hospitalist notes, last 24 h vitals and pain scores, last 48 h intake and output, last 24 h labs and trends, and last 24 h imaging results.  I have reviewed this patient's available data, including medical history, events of note, test results, etc as part of my evaluation.   A significant portion of that time was spent in counseling. Care during the described time interval was provided by me.  This care required moderate level of medical decision making.  05/03/2023    Subjective: (Chief complaint)  Patient refusing NG tube.  Patient refusing Gastrografin.  Has many questions and concerns.  Patient questioning many of the  orders and instructions.  Patient only recalls open cholecystectomy done many decades ago.  She thinks she still has her appendix.  Gives a story suspicious for a twisted bowel requiring some type of endoscopy.  Sounds like she had a sigmoid volvulus requiring flex sigmoidoscopy and twisting in 2007 when she was living in [place].  Nursing in room.  Objective:  Vital signs:  Vitals:   05/02/23 1930 05/02/23 2025 05/03/23 0145 05/03/23 0530  BP: 135/81 (!) 136/98 (!) 146/83 (!) 140/78  Pulse: 94 95 92 90  Resp: 17 17 17 18    Temp:  98.4 F (36.9 C) 98.3 F (36.8 C) 98.4 F (36.9 C)  TempSrc:  Oral  Oral  SpO2: 91% 95% 92% 90%  Weight:      Height:        Last BM Date : 04/30/23  Intake/Output   Yesterday:  09/14 0701 - 09/15 0700 In: 1779.2 [P.O.:120; I.V.:92.5; IV Piggyback:1566.8] Out: 0  This shift:  No intake/output data recorded.  Bowel function:  Flatus: No  BM:  No  Drain: (No drain)   Physical Exam:  General: Pt awake/alert in mild acute distress.  Tired but not toxic nor sickly Eyes: PERRL, normal EOM.  Sclera clear.  No icterus Neuro: CN II-XII intact w/o focal sensory/motor deficits. Lymph: No head/neck/groin lymphadenopathy Psych:  No delerium/psychosis/paranoia.  Oriented x 4.  She is mildly confused and sleepy. HENT: Normocephalic, Mucus membranes moist.  No thrush Neck: Supple, No tracheal deviation.  No obvious thyromegaly Chest: No pain to chest wall compression.  Good respiratory excursion.  No audible wheezing CV:  Pulses intact.  Regular rhythm.  No major extremity edema MS: Normal AROM mjr joints.  No obvious deformity  Abdomen: Soft.  Moderately distended.  Mild discomfort in lower abdomen R>L.  No guarding or peritonitis.  No tenderness specifically at McBurney's point.  Midline incision without hernia..  No evidence of peritonitis.  No incarcerated hernias.  Ext:   No deformity.  No mjr edema.  No cyanosis Skin: No petechiae / purpurea.  No major sores.  Warm and dry    Results:   Cultures: Recent Results (from the past 720 hour(s))  Resp panel by RT-PCR (RSV, Flu A&B, Covid) Anterior Nasal Swab     Status: Abnormal   Collection Time: 05/02/23  3:48 PM   Specimen: Anterior Nasal Swab  Result Value Ref Range Status   SARS Coronavirus 2 by RT PCR NEGATIVE NEGATIVE Final    Comment: (NOTE) SARS-CoV-2 target nucleic acids are NOT DETECTED.  The SARS-CoV-2 RNA is generally detectable in upper respiratory specimens during the acute phase of infection. The  lowest concentration of SARS-CoV-2 viral copies this assay can detect is 138 copies/mL. A negative result does not preclude SARS-Cov-2 infection and should not be used as the sole basis for treatment or other patient management decisions. A negative result may occur with  improper specimen collection/handling, submission of specimen other than nasopharyngeal swab, presence of viral mutation(s) within the areas targeted by this assay, and inadequate number of viral copies(<138 copies/mL). A negative result must be combined with clinical observations, patient history, and epidemiological information. The expected result is Negative.  Fact Sheet for Patients:  BloggerCourse.com  Fact Sheet for Healthcare Providers:  SeriousBroker.it  This test is no t yet approved or cleared by the [place] FDA and  has been authorized for detection and/or diagnosis of SARS-CoV-2 by FDA under an Emergency Use Authorization (EUA). This EUA will remain  in effect (meaning this test can be used) for the duration of the COVID-19 declaration under Section 564(b)(1) of the Act, 21 U.S.C.section 360bbb-3(b)(1), unless the authorization is terminated  or revoked sooner.       Influenza A by PCR POSITIVE (A) NEGATIVE Final   Influenza B by PCR NEGATIVE NEGATIVE Final    Comment: (NOTE) The Xpert Xpress SARS-CoV-2/FLU/RSV plus assay is intended as an aid in the diagnosis of influenza from Nasopharyngeal swab specimens and should not be used as a sole basis for treatment. Nasal washings and aspirates are unacceptable for Xpert Xpress SARS-CoV-2/FLU/RSV testing.  Fact Sheet for Patients: BloggerCourse.com  Fact Sheet for Healthcare Providers: SeriousBroker.it  This test is not yet approved or cleared by the [place] FDA and has been authorized for detection and/or diagnosis of SARS-CoV-2 by FDA  under an Emergency Use Authorization (EUA). This EUA will remain in effect (meaning this test can be used) for the duration of the COVID-19 declaration under Section 564(b)(1) of the Act, 21 U.S.C. section 360bbb-3(b)(1), unless the authorization is terminated or revoked.     Resp Syncytial Virus by PCR NEGATIVE NEGATIVE Final    Comment: (NOTE) Fact Sheet for Patients: BloggerCourse.com  Fact Sheet for Healthcare Providers: SeriousBroker.it  This test is not yet approved or cleared by the [place] FDA and has been authorized for detection and/or diagnosis of SARS-CoV-2 by FDA under an Emergency Use Authorization (EUA). This EUA will remain in effect (meaning this test can be used) for the duration of the COVID-19 declaration under Section 564(b)(1) of the Act, 21 U.S.C. section 360bbb-3(b)(1), unless the authorization is terminated or revoked.  Performed at [facility], [address], [place], [place] [address]   Blood culture (routine x 2)     Status: None (Preliminary result)   Collection Time: 05/02/23  8:30 PM   Specimen: BLOOD RIGHT ARM  Result Value Ref Range Status   Specimen Description   Final    BLOOD RIGHT ARM Performed at [facility], [address], [place], [place] [address]    Special Requests   Final    BOTTLES DRAWN AEROBIC AND ANAEROBIC Blood Culture adequate volume Performed at [facility], [address] [place], [place] [address]    Culture PENDING  Incomplete   Report Status PENDING  Incomplete  Blood culture (routine x 2)     Status: None (Preliminary result)   Collection Time: 05/02/23  8:39 PM   Specimen: BLOOD RIGHT ARM  Result Value Ref Range Status   Specimen Description   Final    BLOOD RIGHT ARM Performed at [facility], [address] [place], [place] [address]    Special Requests   Final    BOTTLES DRAWN AEROBIC ONLY Blood  Culture adequate volume Performed at [facility], [address] [place], [place] [address]    Culture PENDING  Incomplete   Report Status PENDING  Incomplete    Labs: Results for orders placed or performed during the hospital encounter of 05/02/23 (from the past 48 hour(s))  Resp panel by RT-PCR (RSV, Flu A&B, Covid) Anterior Nasal Swab     Status: Abnormal   Collection Time: 05/02/23  3:48 PM   Specimen: Anterior Nasal Swab  Result Value Ref Range   SARS Coronavirus 2 by RT PCR NEGATIVE NEGATIVE    Comment: (NOTE) SARS-CoV-2 target nucleic acids are NOT DETECTED.  The SARS-CoV-2 RNA is generally detectable in upper respiratory  specimens during the acute phase of infection. The lowest concentration of SARS-CoV-2 viral copies this assay can detect is 138 copies/mL. A negative result does not preclude SARS-Cov-2 infection and should not be used as the sole basis for treatment or other patient management decisions. A negative result may occur with  improper specimen collection/handling, submission of specimen other than nasopharyngeal swab, presence of viral mutation(s) within the areas targeted by this assay, and inadequate number of viral copies(<138 copies/mL). A negative result must be combined with clinical observations, patient history, and epidemiological information. The expected result is Negative.  Fact Sheet for Patients:  BloggerCourse.com  Fact Sheet for Healthcare Providers:  SeriousBroker.it  This test is no t yet approved or cleared by the [place] FDA and  has been authorized for detection and/or diagnosis of SARS-CoV-2 by FDA under an Emergency Use Authorization (EUA). This EUA will remain  in effect (meaning this test can be used) for the duration of the COVID-19 declaration under Section 564(b)(1) of the Act, 21 U.S.C.section 360bbb-3(b)(1), unless the authorization is terminated  or  revoked sooner.       Influenza A by PCR POSITIVE (A) NEGATIVE   Influenza B by PCR NEGATIVE NEGATIVE    Comment: (NOTE) The Xpert Xpress SARS-CoV-2/FLU/RSV plus assay is intended as an aid in the diagnosis of influenza from Nasopharyngeal swab specimens and should not be used as a sole basis for treatment. Nasal washings and aspirates are unacceptable for Xpert Xpress SARS-CoV-2/FLU/RSV testing.  Fact Sheet for Patients: BloggerCourse.com  Fact Sheet for Healthcare Providers: SeriousBroker.it  This test is not yet approved or cleared by the [place] FDA and has been authorized for detection and/or diagnosis of SARS-CoV-2 by FDA under an Emergency Use Authorization (EUA). This EUA will remain in effect (meaning this test can be used) for the duration of the COVID-19 declaration under Section 564(b)(1) of the Act, 21 U.S.C. section 360bbb-3(b)(1), unless the authorization is terminated or revoked.     Resp Syncytial Virus by PCR NEGATIVE NEGATIVE    Comment: (NOTE) Fact Sheet for Patients: BloggerCourse.com  Fact Sheet for Healthcare Providers: SeriousBroker.it  This test is not yet approved or cleared by the [place] FDA and has been authorized for detection and/or diagnosis of SARS-CoV-2 by FDA under an Emergency Use Authorization (EUA). This EUA will remain in effect (meaning this test can be used) for the duration of the COVID-19 declaration under Section 564(b)(1) of the Act, 21 U.S.C. section 360bbb-3(b)(1), unless the authorization is terminated or revoked.  Performed at [facility], [address], [place], [place] [address]   Lipase, blood     Status: Abnormal   Collection Time: 05/02/23  4:21 PM  Result Value Ref Range   Lipase <10 (L) 11 - 51 U/L    Comment: Performed at [facility], [address],  [place], [place] [address]  Comprehensive metabolic panel     Status: Abnormal   Collection Time: 05/02/23  4:21 PM  Result Value Ref Range   Sodium 137 135 - 145 mmol/L   Potassium 4.4 3.5 - 5.1 mmol/L   Chloride 99 98 - 111 mmol/L   CO2 26 22 - 32 mmol/L   Glucose, Bld 169 (H) 70 - 99 mg/dL    Comment: Glucose reference range applies only to samples taken after fasting for at least 8 hours.   BUN 45 (H) 8 - 23 mg/dL   Creatinine, Ser 0.98 (H) 0.44 - 1.00 mg/dL   Calcium  9.1 8.9 - 10.3 mg/dL   Total Protein 6.6 6.5 - 8.1 g/dL   Albumin 3.9 3.5 - 5.0 g/dL   AST 26 15 - 41 U/L   ALT 17 0 - 44 U/L   Alkaline Phosphatase 38 38 - 126 U/L   Total Bilirubin 0.7 0.3 - 1.2 mg/dL   GFR, Estimated 46 (L) >60 mL/min    Comment: (NOTE) Calculated using the CKD-EPI Creatinine Equation (2021)    Anion gap 12 5 - 15    Comment: Performed at [facility], [address], [place], [place] [address]  CBC     Status: Abnormal   Collection Time: 05/02/23  4:21 PM  Result Value Ref Range   WBC 11.2 (H) 4.0 - 10.5 K/uL   RBC 4.82 3.87 - 5.11 MIL/uL   Hemoglobin 14.4 12.0 - 15.0 g/dL   HCT 10.2 72.5 - 36.6 %   MCV 90.5 80.0 - 100.0 fL   MCH 29.9 26.0 - 34.0 pg   MCHC 33.0 30.0 - 36.0 g/dL   RDW 44.0 34.7 - 42.5 %   Platelets 122 (L) 150 - 400 K/uL    Comment: REPEATED TO VERIFY   nRBC 0.0 0.0 - 0.2 %    Comment: Performed at [facility], [address] [place], [place] [address]  Urinalysis, Routine w reflex microscopic -Urine, Clean Catch     Status: Abnormal   Collection Time: 05/02/23  5:05 PM  Result Value Ref Range   Color, Urine YELLOW YELLOW   APPearance CLEAR CLEAR   Specific Gravity, Urine 1.026 1.005 - 1.030   pH 5.5 5.0 - 8.0   Glucose, UA NEGATIVE NEGATIVE mg/dL   Hgb urine dipstick NEGATIVE NEGATIVE   Bilirubin Urine NEGATIVE NEGATIVE   Ketones, ur NEGATIVE NEGATIVE mg/dL   Protein, ur 30 (A) NEGATIVE mg/dL   Nitrite NEGATIVE NEGATIVE    Leukocytes,Ua LARGE (A) NEGATIVE   RBC / HPF 0-5 0 - 5 RBC/hpf   WBC, UA >50 0 - 5 WBC/hpf   Bacteria, UA RARE (A) NONE SEEN   Squamous Epithelial / HPF 0-5 0 - 5 /HPF   Mucus PRESENT    Hyaline Casts, UA PRESENT    Non Squamous Epithelial 0-5 (A) NONE SEEN    Comment: Performed at [facility], [address] [place], [place] [address]  Lactic acid, plasma     Status: None   Collection Time: 05/02/23  5:42 PM  Result Value Ref Range   Lactic Acid, Venous 1.3 0.5 - 1.9 mmol/L    Comment: Performed at [facility], [address] [place], [place] [address]  Lactic acid, plasma     Status: None   Collection Time: 05/02/23  8:30 PM  Result Value Ref Range   Lactic Acid, Venous 1.5 0.5 - 1.9 mmol/L    Comment: Performed at [facility], [address], [place], [place] [address]  Blood culture (routine x 2)     Status: None (Preliminary result)   Collection Time: 05/02/23  8:30 PM   Specimen: BLOOD RIGHT ARM  Result Value Ref Range   Specimen Description      BLOOD RIGHT ARM Performed at [facility], [address] [place], [place] [address]    Special Requests      BOTTLES DRAWN AEROBIC AND ANAEROBIC Blood Culture adequate volume Performed at [facility], [address], [place], [place] [address]    Culture PENDING  Report Status PENDING   Blood culture (routine x 2)     Status: None (Preliminary result)   Collection Time: 05/02/23  8:39 PM   Specimen: BLOOD RIGHT ARM  Result Value Ref Range   Specimen Description      BLOOD RIGHT ARM Performed at [facility], [address] [place], [place] [address]    Special Requests      BOTTLES DRAWN AEROBIC ONLY Blood Culture adequate volume Performed at [facility], [address] [place], [place] [address]    Culture PENDING    Report Status PENDING   Glucose, capillary     Status: Abnormal   Collection Time:  05/03/23  2:17 AM  Result Value Ref Range   Glucose-Capillary 148 (H) 70 - 99 mg/dL    Comment: Glucose reference range applies only to samples taken after fasting for at least 8 hours.  CBC     Status: Abnormal   Collection Time: 05/03/23  4:30 AM  Result Value Ref Range   WBC 8.8 4.0 - 10.5 K/uL   RBC 4.21 3.87 - 5.11 MIL/uL   Hemoglobin 12.7 12.0 - 15.0 g/dL   HCT 62.9 52.8 - 41.3 %   MCV 92.6 80.0 - 100.0 fL   MCH 30.2 26.0 - 34.0 pg   MCHC 32.6 30.0 - 36.0 g/dL   RDW 24.4 01.0 - 27.2 %   Platelets 110 (L) 150 - 400 K/uL   nRBC 0.0 0.0 - 0.2 %    Comment: Performed at [facility], [address], [place], [place] [address]  Basic metabolic panel     Status: Abnormal   Collection Time: 05/03/23  4:30 AM  Result Value Ref Range   Sodium 135 135 - 145 mmol/L   Potassium 4.2 3.5 - 5.1 mmol/L   Chloride 102 98 - 111 mmol/L   CO2 25 22 - 32 mmol/L   Glucose, Bld 175 (H) 70 - 99 mg/dL    Comment: Glucose reference range applies only to samples taken after fasting for at least 8 hours.   BUN 48 (H) 8 - 23 mg/dL   Creatinine, Ser 4.03 0.44 - 1.00 mg/dL   Calcium 8.5 (L) 8.9 - 10.3 mg/dL   GFR, Estimated 56 (L) >60 mL/min    Comment: (NOTE) Calculated using the CKD-EPI Creatinine Equation (2021)    Anion gap 8 5 - 15    Comment: Performed at [facility], [address], [place], [place] [address]  Brain natriuretic peptide     Status: None   Collection Time: 05/03/23  4:30 AM  Result Value Ref Range   B Natriuretic Peptide 49.6 0.0 - 100.0 pg/mL    Comment: Performed at [facility], [address] [place], [place] [address]  Glucose, capillary     Status: Abnormal   Collection Time: 05/03/23  5:40 AM  Result Value Ref Range   Glucose-Capillary 155 (H) 70 - 99 mg/dL    Comment: Glucose reference range applies only to samples taken after fasting for at least 8 hours.    Imaging / Studies: CT ABDOMEN PELVIS W  CONTRAST  Result Date: 05/02/2023 CLINICAL DATA:  Abdominal pain, vomiting, diarrhea EXAM: CT ABDOMEN AND PELVIS WITH CONTRAST TECHNIQUE: Multidetector CT imaging of the abdomen and pelvis was performed using the standard protocol following bolus administration of intravenous contrast. RADIATION DOSE REDUCTION: This exam was performed according to the departmental dose-optimization program which includes automated exposure  control, adjustment of the mA and/or kV according to patient size and/or use of iterative reconstruction technique. CONTRAST:  70mL OMNIPAQUE IOHEXOL 300 MG/ML  SOLN COMPARISON:  None Available. FINDINGS: Lower chest: Small right and trace left pleural effusions. Associated right basilar atelectasis. Hepatobiliary: Subcentimeter hepatic cysts. Status post cholecystectomy. Mild central intrahepatic ductal dilatation. Common duct measures 8 mm, within normal limits. Pancreas: Within normal limits. Spleen: Calcified splenic granulomata. Adrenals/Urinary Tract: Adrenal glands are within normal limits. Bilateral renal cysts, including a dominant 6.2 cm simple left lower pole renal cyst (series 2/image 42), benign (Bosniak I). No follow-up is recommended. No hydronephrosis. Bladder is within normal limits. Stomach/Bowel: Stomach is within normal limits. Multiple dilated loops of small bowel the left mid abdomen, suggesting small bowel obstruction. Multiple decompressed loops in the right mid/lower abdomen (for example, series 6/image 62) in an area of mesenteric stranding/fluid (series 2/image 50), suggesting a possible internal hernia. While an adjacent loop of small bowel is mildly thick-walled (series 2/image 2), there is no pneumatosis. Enhancement/inflammation along the medial cecum (series 2/image 61), without discrete/visualized appendix. Colon is otherwise within normal limits. Vascular/Lymphatic: Saccular outpouching along the right lateral aspect of the infrarenal abdominal aorta (coronal  image 55), suggesting a small saccular aneurysm, measuring up to 2.8 cm in maximal diameter. Atherosclerotic calcifications of the abdominal aorta and branch vessels. No suspicious abdominopelvic lymphadenopathy. Reproductive: Uterus is within normal limits. No adnexal masses. Other: Small to moderate abdominopelvic ascites. No free air. Musculoskeletal: Mild degenerative changes of the thoracic spine. IMPRESSION: Small bowel obstruction with transition in the right mid/lower abdomen. Possible underlying internal hernia, although equivocal. Surgical consultation is suggested. Enhancement/inflammation along the medial cecum, without discrete/visualized appendix. If this patient has had a prior appendectomy, this appearance would be worrisome for cecal mass/cancer. However, in the appropriate clinical setting, acute appendicitis is also possible. Small to moderate abdominopelvic ascites. No pneumatosis or free air. Small right and trace left pleural effusions. 2.8 cm saccular infrarenal abdominal aortic aneurysm. Recommend referral to a vascular specialist.This recommendation follows ACR consensus guidelines: White Paper of the ACR Incidental Findings Committee II on Vascular Findings. J Am Coll Radiol 2013; 10:789-794. Aortic Atherosclerosis (ICD10-I70.0). Electronically Signed   By: Charline Bills M.D.   On: 05/02/2023 17:41    Medications / Allergies: per chart  Antibiotics: Anti-infectives (From admission, onward)    Start     Dose/Rate Route Frequency Ordered Stop   05/03/23 1000  piperacillin-tazobactam (ZOSYN) IVPB 3.375 g        3.375 g 12.5 mL/hr over 240 Minutes Intravenous Every 8 hours 05/02/23 2213     05/02/23 2200  oseltamivir (TAMIFLU) capsule 30 mg        30 mg Oral 2 times daily 05/02/23 2131 05/07/23 2159   05/02/23 1800  piperacillin-tazobactam (ZOSYN) IVPB 3.375 g        3.375 g 12.5 mL/hr over 240 Minutes Intravenous  Once 05/02/23 1759 05/03/23 0542          Note: Portions of this report may have been transcribed using voice recognition software. Every effort was made to ensure accuracy; however, inadvertent computerized transcription errors may be present.   Any transcriptional errors that result from this process are unintentional.    Ardeth Sportsman, MD, FACS, MASCRS Esophageal, Gastrointestinal & Colorectal Surgery Robotic and Minimally Invasive Surgery  [facility] [address] [place], [place] [address] [phone] Fax [phone] Main  CONTACT INFORMATION: Weekday (  9AM-5PM): Call [facility] main office at [phone] Weeknight (5PM-9AM) or Weekend/Holiday: Check EPIC "Web Links" tab & use "AMION" (password " [email or web address]") for General Surgery [facility] coverage  Please, DO NOT use SecureChat  (it is not reliable communication to reach operating surgeons & will lead to a delay in care).   Epic staff messaging available for outptient concerns needing 1-2 business day response.      05/03/2023  8:23 AM

## 2023-05-03 NOTE — Hospital Course (Addendum)
Patricia Peters is a 87 y.o. female with a history of cholecystectomy.  Patient presented secondary to extreme fatigue, upper abdominal pain, nausea, vomiting and diarrhea. Patient was found to have evidence of influenza A infection in addition to SBO with transition point. General surgery consulted with recommendation for conservative management. NG tube placed. Patient started on oseltamivir for influenza A treatment.Patient followed by general surgery.  Patient overall has failed to improve with ongoing abdominal distention. Respiratory status has remained stable although cough, chest x-ray has been clear no pneumonia, completed Tamiflu. 9/20: Underwent ex laparotomy with extensive lysis of adhesion 9/23: NG tube discontinued, advanced to soft diet 9/25: Medically stable, awaiting insurance authorization for SNF. 9/26: Patient continued to have improved mobility, insurance authorization denied even with peer to peer stating that she has to go to mobility for a short-term rehab.  Likely be going home tomorrow with home health services.  9/27: Patient remained stable and is being discharged home with maximum home health services to help as insurance declined her rehab.  She was instructed to keep herself well-hydrated and avoid constipation. Appointments were provided for follow-up with general surgery.  Patient will continue on current medications and need to have a close follow-up with her providers for further recommendations.

## 2023-05-03 NOTE — Progress Notes (Signed)
Patient arrived [place], vital signs are stable. See Flowsheets.  Alert and oriented, ambulatory, cooperative. She has a 20 G PIV in her left AC. Bolus of LR administered and then Normal Saline at 75 ml/hr. Patient has an order to insert an NG tube which she declines. All medications administered with a small sip of water. She can have ice chips. Positive for Flu A and is on Droplet precautions. Asymptomatic but blood cultures are positive. Currently on Zosyn. Discontinued 2l of Oxygen as she maintains O2 saturation in the 90s. Currently NPO due to her condition. CBGs are being taken at this time. Allergies to cipro and  sulfa. Husband is also admitted here at [facility] in another unit.K pad aquathermia and SCDs.

## 2023-05-03 NOTE — Progress Notes (Signed)
#   16 Fr salem sump inserted into pt L nare w/o difficulty w immediate return of 500 cc's brownish liquid. Pt tol well, awaiting placement verification.

## 2023-05-04 ENCOUNTER — Inpatient Hospital Stay (HOSPITAL_COMMUNITY): Payer: Medicare PPO

## 2023-05-04 DIAGNOSIS — K56609 Unspecified intestinal obstruction, unspecified as to partial versus complete obstruction: Secondary | ICD-10-CM | POA: Diagnosis not present

## 2023-05-04 DIAGNOSIS — R413 Other amnesia: Secondary | ICD-10-CM | POA: Insufficient documentation

## 2023-05-04 LAB — BASIC METABOLIC PANEL
Anion gap: 8 (ref 5–15)
BUN: 41 mg/dL — ABNORMAL HIGH (ref 8–23)
CO2: 28 mmol/L (ref 22–32)
Calcium: 8.5 mg/dL — ABNORMAL LOW (ref 8.9–10.3)
Chloride: 104 mmol/L (ref 98–111)
Creatinine, Ser: 1 mg/dL (ref 0.44–1.00)
GFR, Estimated: 55 mL/min — ABNORMAL LOW (ref >60)
Glucose, Bld: 133 mg/dL — ABNORMAL HIGH (ref 70–99)
Potassium: 3.7 mmol/L (ref 3.5–5.1)
Sodium: 140 mmol/L (ref 135–145)

## 2023-05-04 LAB — CBC
HCT: 40.5 % (ref 36.0–46.0)
Hemoglobin: 12.9 g/dL (ref 12.0–15.0)
MCH: 29.7 pg (ref 26.0–34.0)
MCHC: 31.9 g/dL (ref 30.0–36.0)
MCV: 93.3 fL (ref 80.0–100.0)
Platelets: 95 10*3/uL — ABNORMAL LOW (ref 150–400)
RBC: 4.34 MIL/uL (ref 3.87–5.11)
RDW: 14.4 % (ref 11.5–15.5)
WBC: 8.8 10*3/uL (ref 4.0–10.5)
nRBC: 0 % (ref 0.0–0.2)

## 2023-05-04 LAB — HEMOGLOBIN A1C
Hgb A1c MFr Bld: 6.4 % — ABNORMAL HIGH (ref 4.8–5.6)
Mean Plasma Glucose: 137 mg/dL

## 2023-05-04 LAB — GLUCOSE, CAPILLARY
Glucose-Capillary: 103 mg/dL — ABNORMAL HIGH (ref 70–99)
Glucose-Capillary: 114 mg/dL — ABNORMAL HIGH (ref 70–99)
Glucose-Capillary: 130 mg/dL — ABNORMAL HIGH (ref 70–99)
Glucose-Capillary: 150 mg/dL — ABNORMAL HIGH (ref 70–99)

## 2023-05-04 NOTE — TOC Initial Note (Signed)
Transition of Care [facility]) - Initial/Assessment Note    Patient Details  Name: Patricia Peters MRN: [id number] Date of Birth: 1936/08/17  Transition of [facility]) CM/SW Contact:    Amada Jupiter, LCSW Phone Number: 05/04/2023, 2:38 PM  Clinical Narrative:                  Met with pt and spoke with son, Patricia Peters via phone today to begin discussion about possible dc planning needs.  Both note that pt's spouse has, also, been hospitalized ([facility]) and all of their children are living out of state ([place], [place] and [place]).  Prior to these illnesses for both parents, pt and spouse have been extremely active and traveling quite often.  Son notes, "this is all new for [place] and we're trying to figure out what we need to do."  Both note that pt's daughter, Patricia Peters, will be arriving in [facility] and will be onsite at the hospital tomorrow.  We discussed possible dc needs ranging from none to SNF, however, explained that this will be based on the physical recovery of their parents.  Reviewed insurance coverage or non-coverage of different dc  "plans".   TOC will plan to follow up with daughter and pt tomorrow and may benefit from PT eval to help decide on best dc plan.    Expected Discharge Plan:  (TBD) Barriers to Discharge: Continued Medical Work up   Patient Goals and CMS Choice Patient states their goals for this hospitalization and ongoing recovery are:: regain her strength          Expected Discharge Plan and Services In-house Referral: Clinical Social Work     Living arrangements for the past 2 months: Single Family Home                                      Prior Living Arrangements/Services Living arrangements for the past 2 months: Single Family Home Lives with:: Spouse Patient language and need for interpreter reviewed:: Yes Do you feel safe going back to the place where you live?: Yes            Criminal Activity/Legal Involvement Pertinent to Current  Situation/Hospitalization: No - Comment as needed  Activities of Daily Living Home Assistive Devices/Equipment: Cane (specify quad or straight) ADL Screening (condition at time of admission) Patient's cognitive ability adequate to safely complete daily activities?: Yes Is the patient deaf or have difficulty hearing?: No Does the patient have difficulty seeing, even when wearing glasses/contacts?: No Does the patient have difficulty concentrating, remembering, or making decisions?: No Patient able to express need for assistance with ADLs?: Yes Does the patient have difficulty dressing or bathing?: No Independently performs ADLs?: No Does the patient have difficulty walking or climbing stairs?: No Weakness of Legs: Both Weakness of Arms/Hands: Both  Permission Sought/Granted Permission sought to share information with : Family Supports Permission granted to share information with : Yes, Verbal Permission Granted  Share Information with NAME: any of her children:  son, Patricia Peters @ [phone] or daughter, Patricia Peters @ [phone]           Emotional Assessment Appearance:: Appears stated age Attitude/Demeanor/Rapport: Gracious Affect (typically observed): Frustrated Orientation: : Oriented to Self, Oriented to Place, Oriented to  Time, Oriented to Situation Alcohol / Substance Use: Not Applicable Psych Involvement: No (comment)  Admission diagnosis:  Small bowel obstruction (HCC) [K56.609] SBO (small bowel obstruction) (  HCC) [K56.609] Influenza A [J10.1] Acute cystitis without hematuria [N30.00] Nausea and vomiting, unspecified vomiting type [R11.2] Patient Active Problem List   Diagnosis Date Noted   Memory changes 05/04/2023   SBO (small bowel obstruction) (HCC) 05/02/2023   Anxiety 05/02/2023   Prediabetes 05/02/2023   Influenza due to identified novel influenza A virus with other respiratory manifestations 05/02/2023   Infrarenal abdominal aortic aneurysm (AAA) without  rupture (HCC) 05/02/2023   Hyperglycemia 05/02/2023   Dehydration 05/02/2023   Hypoxia 05/02/2023   Thrombocytopenia (HCC) 05/02/2023   Allergic rhinitis 05/02/2023   Difficulty sleeping 05/02/2023   Pure hypercholesterolemia 05/02/2023   Anemia 05/02/2023   CKD (chronic kidney disease) stage 2, GFR 60-89 ml/min 05/02/2023   PHN (postherpetic neuralgia) 06/25/2013   PCP:  Ollen Bowl, MD Pharmacy:   [phone] - [place], [place] - [address] AT [address] OF [facility] [address] [place] [address] Phone: [phone] Fax: [phone]     Social Determinants of Health (SDOH) Social History: SDOH Screenings   Food Insecurity: No Food Insecurity (05/02/2023)  Housing: Low Risk  (05/02/2023)  Transportation Needs: No Transportation Needs (05/02/2023)  Utilities: Not At Risk (05/02/2023)  Tobacco Use: Low Risk  (05/02/2023)   SDOH Interventions:     Readmission Risk Interventions    05/04/2023    2:34 PM  Readmission Risk Prevention Plan  Post Dischage Appt Complete  Medication Screening Complete  Transportation Screening Complete

## 2023-05-04 NOTE — Progress Notes (Signed)
Subjective: CC: No abdominal pain currently. Has not required any prn IV pain medication over the last 24 hours. Still having nausea. No flatus. Thinks she has had a soft bm since admission but not sure when. Last BM on I/O 9/12. NGT in place with thin brown output in cannister. No contrast in colon on last nights xray.   Tmax 99.7. No tachcyardia (HR max 96, last set of vitals 82). No hypotension. NGT w/ 2000cc/24 hours. No BM. WBC 8.8. K 3.7. No prn pain medication in the last 24 hours. Xray pending this am.   Objective: Vital signs in last 24 hours: Temp:  [97.5 F (36.4 C)-99.7 F (37.6 C)] 99.7 F (37.6 C) (09/16 0604) Pulse Rate:  [82-96] 82 (09/16 0604) Resp:  [16-18] 17 (09/16 0604) BP: (147-172)/(78-91) 172/79 (09/16 0604) SpO2:  [90 %-94 %] 91 % (09/16 0604) Last BM Date : 04/30/23  Intake/Output from previous day: 09/15 0701 - 09/16 0700 In: 2021.1 [I.V.:1758.7; NG/GT:120; IV Piggyback:142.5] Out: 2450 [Urine:450; Emesis/NG output:2000] Intake/Output this shift: No intake/output data recorded.  PE: Gen:  Alert, NAD, pleasant Abd: Soft, mild to moderate distension, periumbilical and epigastric ttp without rigidity or guarding. Hypoactive BS. NGT with  thin brown output in cannister - 2000cc/24 hours.  Prior midline scar well healed    Lab Results:  Recent Labs    05/03/23 0430 05/04/23 0444  WBC 8.8 8.8  HGB 12.7 12.9  HCT 39.0 40.5  PLT 110* 95*   BMET Recent Labs    05/03/23 0430 05/04/23 0444  NA 135 140  K 4.2 3.7  CL 102 104  CO2 25 28  GLUCOSE 175* 133*  BUN 48* 41*  CREATININE 0.99 1.00  CALCIUM 8.5* 8.5*   PT/INR No results for input(s): "LABPROT", "INR" in the last 72 hours. CMP     Component Value Date/Time   NA 140 05/04/2023 0444   K 3.7 05/04/2023 0444   CL 104 05/04/2023 0444   CO2 28 05/04/2023 0444   GLUCOSE 133 (H) 05/04/2023 0444   BUN 41 (H) 05/04/2023 0444   CREATININE 1.00 05/04/2023 0444   CALCIUM 8.5 (L)  05/04/2023 0444   PROT 6.6 05/02/2023 1621   ALBUMIN 3.9 05/02/2023 1621   AST 26 05/02/2023 1621   ALT 17 05/02/2023 1621   ALKPHOS 38 05/02/2023 1621   BILITOT 0.7 05/02/2023 1621   GFRNONAA 55 (L) 05/04/2023 0444   Lipase     Component Value Date/Time   LIPASE <10 (L) 05/02/2023 1621    Studies/Results: DG Abd Portable 1V-Small Bowel Obstruction Protocol-initial, 8 hr delay  Result Date: 05/03/2023 CLINICAL DATA:  Small-bowel obstruction EXAM: PORTABLE ABDOMEN - 1 VIEW COMPARISON:  Film from earlier in the same day. FINDINGS: Gastric catheter is again identified. Administered contrast now is distributed throughout multiple dilated loops of small bowel. No definitive colonic contrast is seen. IMPRESSION: Contrast scattered throughout multiple dilated loops of small bowel. No colonic contrast is seen. 24 hour follow-up film is recommended. Electronically Signed   By: Alcide Clever M.D.   On: 05/03/2023 19:44   DG Abd 1 View  Result Date: 05/03/2023 CLINICAL DATA:  Small bowel obstruction. EXAM: ABDOMEN - 1 VIEW COMPARISON:  CT scan from 1 day prior. FINDINGS: Contrast material opacifies the distal stomach and duodenum. Transverse duodenum is dilated up to 5.5 cm. Contrast material has not crossed the midline. Diffuse gaseous small bowel distention evident. IMPRESSION: 1. Contrast material opacifies the distal stomach  and dilated duodenum. Duodenal contrast material has not yet passed the midline. 2. Diffuse gaseous small bowel distention. Electronically Signed   By: Kennith Center M.D.   On: 05/03/2023 11:30   CT ABDOMEN PELVIS W CONTRAST  Result Date: 05/02/2023 CLINICAL DATA:  Abdominal pain, vomiting, diarrhea EXAM: CT ABDOMEN AND PELVIS WITH CONTRAST TECHNIQUE: Multidetector CT imaging of the abdomen and pelvis was performed using the standard protocol following bolus administration of intravenous contrast. RADIATION DOSE REDUCTION: This exam was performed according to the departmental  dose-optimization program which includes automated exposure control, adjustment of the mA and/or kV according to patient size and/or use of iterative reconstruction technique. CONTRAST:  70mL OMNIPAQUE IOHEXOL 300 MG/ML  SOLN COMPARISON:  None Available. FINDINGS: Lower chest: Small right and trace left pleural effusions. Associated right basilar atelectasis. Hepatobiliary: Subcentimeter hepatic cysts. Status post cholecystectomy. Mild central intrahepatic ductal dilatation. Common duct measures 8 mm, within normal limits. Pancreas: Within normal limits. Spleen: Calcified splenic granulomata. Adrenals/Urinary Tract: Adrenal glands are within normal limits. Bilateral renal cysts, including a dominant 6.2 cm simple left lower pole renal cyst (series 2/image 42), benign (Bosniak I). No follow-up is recommended. No hydronephrosis. Bladder is within normal limits. Stomach/Bowel: Stomach is within normal limits. Multiple dilated loops of small bowel the left mid abdomen, suggesting small bowel obstruction. Multiple decompressed loops in the right mid/lower abdomen (for example, series 6/image 62) in an area of mesenteric stranding/fluid (series 2/image 50), suggesting a possible internal hernia. While an adjacent loop of small bowel is mildly thick-walled (series 2/image 2), there is no pneumatosis. Enhancement/inflammation along the medial cecum (series 2/image 61), without discrete/visualized appendix. Colon is otherwise within normal limits. Vascular/Lymphatic: Saccular outpouching along the right lateral aspect of the infrarenal abdominal aorta (coronal image 55), suggesting a small saccular aneurysm, measuring up to 2.8 cm in maximal diameter. Atherosclerotic calcifications of the abdominal aorta and branch vessels. No suspicious abdominopelvic lymphadenopathy. Reproductive: Uterus is within normal limits. No adnexal masses. Other: Small to moderate abdominopelvic ascites. No free air. Musculoskeletal: Mild  degenerative changes of the thoracic spine. IMPRESSION: Small bowel obstruction with transition in the right mid/lower abdomen. Possible underlying internal hernia, although equivocal. Surgical consultation is suggested. Enhancement/inflammation along the medial cecum, without discrete/visualized appendix. If this patient has had a prior appendectomy, this appearance would be worrisome for cecal mass/cancer. However, in the appropriate clinical setting, acute appendicitis is also possible. Small to moderate abdominopelvic ascites. No pneumatosis or free air. Small right and trace left pleural effusions. 2.8 cm saccular infrarenal abdominal aortic aneurysm. Recommend referral to a vascular specialist.This recommendation follows ACR consensus guidelines: White Paper of the ACR Incidental Findings Committee II on Vascular Findings. J Am Coll Radiol 2013; 10:789-794. Aortic Atherosclerosis (ICD10-I70.0). Electronically Signed   By: Charline Bills M.D.   On: 05/02/2023 17:41    Anti-infectives: Anti-infectives (From admission, onward)    Start     Dose/Rate Route Frequency Ordered Stop   05/03/23 1000  piperacillin-tazobactam (ZOSYN) IVPB 3.375 g        3.375 g 12.5 mL/hr over 240 Minutes Intravenous Every 8 hours 05/02/23 2213     05/02/23 2200  oseltamivir (TAMIFLU) capsule 30 mg        30 mg Oral 2 times daily 05/02/23 2131 05/07/23 2159   05/02/23 1800  piperacillin-tazobactam (ZOSYN) IVPB 3.375 g        3.375 g 12.5 mL/hr over 240 Minutes Intravenous  Once 05/02/23 1759 05/03/23 0542  Assessment/Plan SBO - CT w/ small bowel obstruction with transition in the right mid/lower abdomen. There is also enhancement/inflammation along the medial cecum. Patient denies hx of appendectomy. As stated in Dr. Michaell Cowing note yesterday, there is concern about a mass in her cecum that may be causing partial obstruction versus this being a separate finding. [facility] has been consulted for Colonoscopy to  further evaluation. Will also check CEA - HDS without fever, tachycardia or hypotension. No peritonitis on exam. WBC 8.8. No current indication for emergency surgery - Cont NGT for decompression and keep NPO - Cont SBO protocol. Xray pending today. AM Xray written for.  - Keep K > 4, Mg > 2 and mobilize as able for bowel function - Pending if patient fails to improve with conservative management, as well as findings on colonoscopy, she may require exploratory surgery during admission.  - We will follow with you.  FEN - NPO, NGT to LIWS, IVF per primary  VTE - SCDs, okay for chemical ppx from our standpoint ID - On Zosyn for possible UTI - Ucx with multiple species.   Influenza A ? UTI AAA  I reviewed nursing notes, hospitalist notes, last 24 h vitals and pain scores, last 48 h intake and output, last 24 h labs and trends, and last 24 h imaging results.   LOS: 2 days    Jacinto Halim , [facility] 05/04/2023, 9:11 AM Please see Amion for pager number during day hours 7:00am-4:30pm

## 2023-05-04 NOTE — Progress Notes (Addendum)
PROGRESS NOTE    Patricia Peters  ZOX:[id number] DOB: January 28, 1936 DOA: 05/02/2023 PCP: Ollen Bowl, MD   Brief Narrative: Patricia Peters is a 87 y.o. female with a history of cholecystectomy.  Patient presented secondary to extreme fatigue, upper abdominal pain, nausea, vomiting and diarrhea. Patient was found to have evidence of influenza A infection in addition to SBO with transition point. General surgery consulted with recommendation for conservative management. NG tube placed. Patient started on oseltamivir for influenza A treatment.   Assessment and Plan:  Small bowel obstruction Noted on CT scan. Transition point located in the right mid/low abdomen. General surgery consulted with recommendation for conservative management. NG tube ordered to be placed. Zosyn IV started. -General surgery recommendations: NG tube placement, small bowel protocol (no contrast in the colon); recommendations pending today  Cecal inflammation/enhancement Concern for appendicitis versus cecal mass/cancer. General surgery recommends inpatient colonoscopy. [facility], Dr. Dulce Sellar, consulted for consideration of inpatient colonoscopy; called on 9/15 with agreement that inpatient colonoscopy is warranted but to re-consult once SBO resolved  Influenza A infection Mild symptoms. On room air. No evidence of pneumonia. Patient started on oseltamivir. -Continue oseltamivir and supportive care  Abnormal urinalysis Patient without urinary symptoms. Pyuria and bacteria noted on urinalysis. Urine culture ordered. Likely asymptomatic bacteruria. No treatment needed.  Abdominopelvic ascites Noted on CT scan and measured as small to moderate. Unclear etiology.  Thrombocytopenia Mild. Likely reactive secondary to acute illness. Still downtrending. -CBC in AMa  Hyperglycemia Noted. Patient's hemoglobin A1C places her in prediabetes range. -Will need to consider metformin vs dietary modification depending on  patient's goals  Dehydration Secondary to nausea/vomiting. IV fluids started. -Continue IV fluids  Bilateral pleural effusions Mild. Noted on CT scan incidentally. Asymptomatic.  Abdominal aortic aneurysm Incidental finding. Aneurysm is infrarenal and measures 2.8 cm. Recommendation for outpatient vascular surgery follow-up.   DVT prophylaxis: SCDs Code Status:   Code Status: Full Code Family Communication: None at bedside Disposition Plan: Discharge pending improvement in obstruction/general surgery recommendations   Consultants:  General surgery  Procedures:  None  Antimicrobials: Oseltamivir    Subjective: Some nausea. States NG tube hurt being put in. States she had a bowel movement; when discussing with nursing, she had a small amount of stool with suppository placement.  Objective: BP (!) 165/91 (BP Location: Left Arm)   Pulse 88   Temp 98.3 F (36.8 C) (Oral)   Resp 16   Ht 5\' 6"  (1.676 m)   Wt 68.9 kg   SpO2 93%   BMI 24.53 kg/m   Examination:  General exam: Appears calm and comfortable Respiratory system: Clear to auscultation. Respiratory effort normal. Cardiovascular system: S1 & S2 heard, RRR. No murmurs, rubs, gallops or clicks. Gastrointestinal system: Abdomen is non-distended, soft and tender. No organomegaly or masses felt. Decreased bowel sounds heard. Central nervous system: Alert and oriented. No focal neurological deficits. Musculoskeletal: No calf tenderness Skin: No cyanosis. No rashes Psychiatry: Judgement and insight appear normal. Mood & affect appropriate.    Data Reviewed: I have personally reviewed following labs and imaging studies  CBC Lab Results  Component Value Date   WBC 8.8 05/04/2023   RBC 4.34 05/04/2023   HGB 12.9 05/04/2023   HCT 40.5 05/04/2023   MCV 93.3 05/04/2023   MCH 29.7 05/04/2023   PLT 95 (L) 05/04/2023   MCHC 31.9 05/04/2023   RDW 14.4 05/04/2023     Last metabolic panel Lab Results  Component  Value Date  NA 140 05/04/2023   K 3.7 05/04/2023   CL 104 05/04/2023   CO2 28 05/04/2023   BUN 41 (H) 05/04/2023   CREATININE 1.00 05/04/2023   GLUCOSE 133 (H) 05/04/2023   GFRNONAA 55 (L) 05/04/2023   CALCIUM 8.5 (L) 05/04/2023   PROT 6.6 05/02/2023   ALBUMIN 3.9 05/02/2023   BILITOT 0.7 05/02/2023   ALKPHOS 38 05/02/2023   AST 26 05/02/2023   ALT 17 05/02/2023   ANIONGAP 8 05/04/2023    GFR: Estimated Creatinine Clearance: 38.5 mL/min (by C-G formula based on SCr of 1 mg/dL).  Recent Results (from the past 240 hour(s))  Resp panel by RT-PCR (RSV, Flu A&B, Covid) Anterior Nasal Swab     Status: Abnormal   Collection Time: 05/02/23  3:48 PM   Specimen: Anterior Nasal Swab  Result Value Ref Range Status   SARS Coronavirus 2 by RT PCR NEGATIVE NEGATIVE Final    Comment: (NOTE) SARS-CoV-2 target nucleic acids are NOT DETECTED.  The SARS-CoV-2 RNA is generally detectable in upper respiratory specimens during the acute phase of infection. The lowest concentration of SARS-CoV-2 viral copies this assay can detect is 138 copies/mL. A negative result does not preclude SARS-Cov-2 infection and should not be used as the sole basis for treatment or other patient management decisions. A negative result may occur with  improper specimen collection/handling, submission of specimen other than nasopharyngeal swab, presence of viral mutation(s) within the areas targeted by this assay, and inadequate number of viral copies(<138 copies/mL). A negative result must be combined with clinical observations, patient history, and epidemiological information. The expected result is Negative.  Fact Sheet for Patients:  BloggerCourse.com  Fact Sheet for Healthcare Providers:  SeriousBroker.it  This test is no t yet approved or cleared by the [place] FDA and  has been authorized for detection and/or diagnosis of SARS-CoV-2 by FDA under an  Emergency Use Authorization (EUA). This EUA will remain  in effect (meaning this test can be used) for the duration of the COVID-19 declaration under Section 564(b)(1) of the Act, 21 U.S.C.section 360bbb-3(b)(1), unless the authorization is terminated  or revoked sooner.       Influenza A by PCR POSITIVE (A) NEGATIVE Final   Influenza B by PCR NEGATIVE NEGATIVE Final    Comment: (NOTE) The Xpert Xpress SARS-CoV-2/FLU/RSV plus assay is intended as an aid in the diagnosis of influenza from Nasopharyngeal swab specimens and should not be used as a sole basis for treatment. Nasal washings and aspirates are unacceptable for Xpert Xpress SARS-CoV-2/FLU/RSV testing.  Fact Sheet for Patients: BloggerCourse.com  Fact Sheet for Healthcare Providers: SeriousBroker.it  This test is not yet approved or cleared by the [place] FDA and has been authorized for detection and/or diagnosis of SARS-CoV-2 by FDA under an Emergency Use Authorization (EUA). This EUA will remain in effect (meaning this test can be used) for the duration of the COVID-19 declaration under Section 564(b)(1) of the Act, 21 U.S.C. section 360bbb-3(b)(1), unless the authorization is terminated or revoked.     Resp Syncytial Virus by PCR NEGATIVE NEGATIVE Final    Comment: (NOTE) Fact Sheet for Patients: BloggerCourse.com  Fact Sheet for Healthcare Providers: SeriousBroker.it  This test is not yet approved or cleared by the [place] FDA and has been authorized for detection and/or diagnosis of SARS-CoV-2 by FDA under an Emergency Use Authorization (EUA). This EUA will remain in effect (meaning this test can be used) for the duration of the COVID-19 declaration under Section 564(b)(1)  of the Act, 21 U.S.C. section 360bbb-3(b)(1), unless the authorization is terminated or revoked.  Performed at [facility], [address] [place], [place] [address]   Urine Culture     Status: Abnormal   Collection Time: 05/02/23  6:05 PM   Specimen: Urine, Clean Catch  Result Value Ref Range Status   Specimen Description   Final    URINE, CLEAN CATCH Performed at [facility], [address] [place], [place] [address]    Special Requests   Final    NONE Performed at [facility], [address], [place], [place] [address]    Culture MULTIPLE SPECIES PRESENT, SUGGEST RECOLLECTION (A)  Final   Report Status 05/03/2023 FINAL  Final  Blood culture (routine x 2)     Status: None (Preliminary result)   Collection Time: 05/02/23  8:30 PM   Specimen: BLOOD RIGHT ARM  Result Value Ref Range Status   Specimen Description   Final    BLOOD RIGHT ARM Performed at [facility], [address], [place], [place] [address]    Special Requests   Final    BOTTLES DRAWN AEROBIC AND ANAEROBIC Blood Culture adequate volume Performed at [facility], [address], [place], [place] [address]    Culture   Final    NO GROWTH 1 DAY Performed at [facility], [address], [place], [place] [address]    Report Status PENDING  Incomplete  Blood culture (routine x 2)     Status: None (Preliminary result)   Collection Time: 05/02/23  8:39 PM   Specimen: BLOOD RIGHT ARM  Result Value Ref Range Status   Specimen Description   Final    BLOOD RIGHT ARM Performed at [facility], [address], [place], [place] [address]    Special Requests   Final    BOTTLES DRAWN AEROBIC ONLY Blood Culture adequate volume Performed at [facility], [address] [place], [place] [address]    Culture   Final    NO GROWTH 1 DAY Performed at [facility], [address], [place], [place] [address]    Report Status PENDING  Incomplete      Radiology Studies: DG Abd Portable 1V-Small Bowel Obstruction  Protocol-initial, 8 hr delay  Result Date: 05/03/2023 CLINICAL DATA:  Small-bowel obstruction EXAM: PORTABLE ABDOMEN - 1 VIEW COMPARISON:  Film from earlier in the same day. FINDINGS: Gastric catheter is again identified. Administered contrast now is distributed throughout multiple dilated loops of small bowel. No definitive colonic contrast is seen. IMPRESSION: Contrast scattered throughout multiple dilated loops of small bowel. No colonic contrast is seen. 24 hour follow-up film is recommended. Electronically Signed   By: Alcide Clever M.D.   On: 05/03/2023 19:44   DG Abd 1 View  Result Date: 05/03/2023 CLINICAL DATA:  Small bowel obstruction. EXAM: ABDOMEN - 1 VIEW COMPARISON:  CT scan from 1 day prior. FINDINGS: Contrast material opacifies the distal stomach and duodenum. Transverse duodenum is dilated up to 5.5 cm. Contrast material has not crossed the midline. Diffuse gaseous small bowel distention evident. IMPRESSION: 1. Contrast material opacifies the distal stomach and dilated duodenum. Duodenal contrast material has not yet passed the midline. 2. Diffuse gaseous small bowel distention. Electronically Signed   By: Kennith Center M.D.   On: 05/03/2023 11:30   CT ABDOMEN PELVIS W CONTRAST  Result Date: 05/02/2023 CLINICAL DATA:  Abdominal pain, vomiting, diarrhea  EXAM: CT ABDOMEN AND PELVIS WITH CONTRAST TECHNIQUE: Multidetector CT imaging of the abdomen and pelvis was performed using the standard protocol following bolus administration of intravenous contrast. RADIATION DOSE REDUCTION: This exam was performed according to the departmental dose-optimization program which includes automated exposure control, adjustment of the mA and/or kV according to patient size and/or use of iterative reconstruction technique. CONTRAST:  70mL OMNIPAQUE IOHEXOL 300 MG/ML  SOLN COMPARISON:  None Available. FINDINGS: Lower chest: Small right and trace left pleural effusions. Associated right basilar atelectasis.  Hepatobiliary: Subcentimeter hepatic cysts. Status post cholecystectomy. Mild central intrahepatic ductal dilatation. Common duct measures 8 mm, within normal limits. Pancreas: Within normal limits. Spleen: Calcified splenic granulomata. Adrenals/Urinary Tract: Adrenal glands are within normal limits. Bilateral renal cysts, including a dominant 6.2 cm simple left lower pole renal cyst (series 2/image 42), benign (Bosniak I). No follow-up is recommended. No hydronephrosis. Bladder is within normal limits. Stomach/Bowel: Stomach is within normal limits. Multiple dilated loops of small bowel the left mid abdomen, suggesting small bowel obstruction. Multiple decompressed loops in the right mid/lower abdomen (for example, series 6/image 62) in an area of mesenteric stranding/fluid (series 2/image 50), suggesting a possible internal hernia. While an adjacent loop of small bowel is mildly thick-walled (series 2/image 2), there is no pneumatosis. Enhancement/inflammation along the medial cecum (series 2/image 61), without discrete/visualized appendix. Colon is otherwise within normal limits. Vascular/Lymphatic: Saccular outpouching along the right lateral aspect of the infrarenal abdominal aorta (coronal image 55), suggesting a small saccular aneurysm, measuring up to 2.8 cm in maximal diameter. Atherosclerotic calcifications of the abdominal aorta and branch vessels. No suspicious abdominopelvic lymphadenopathy. Reproductive: Uterus is within normal limits. No adnexal masses. Other: Small to moderate abdominopelvic ascites. No free air. Musculoskeletal: Mild degenerative changes of the thoracic spine. IMPRESSION: Small bowel obstruction with transition in the right mid/lower abdomen. Possible underlying internal hernia, although equivocal. Surgical consultation is suggested. Enhancement/inflammation along the medial cecum, without discrete/visualized appendix. If this patient has had a prior appendectomy, this appearance  would be worrisome for cecal mass/cancer. However, in the appropriate clinical setting, acute appendicitis is also possible. Small to moderate abdominopelvic ascites. No pneumatosis or free air. Small right and trace left pleural effusions. 2.8 cm saccular infrarenal abdominal aortic aneurysm. Recommend referral to a vascular specialist.This recommendation follows ACR consensus guidelines: White Paper of the ACR Incidental Findings Committee II on Vascular Findings. J Am Coll Radiol 2013; 10:789-794. Aortic Atherosclerosis (ICD10-I70.0). Electronically Signed   By: Charline Bills M.D.   On: 05/02/2023 17:41      LOS: 2 days    Jacquelin Hawking, MD [facility] 05/04/2023, 1:20 PM   If 7PM-7AM, please contact night-coverage [email or web address]

## 2023-05-04 NOTE — Plan of Care (Signed)
Problem: Education: Goal: Knowledge of General Education information will improve Description: Including pain rating scale, medication(s)/side effects and non-pharmacologic comfort measures Outcome: Progressing   Problem: Coping: Goal: Level of anxiety will decrease Outcome: Progressing   Problem: Pain Managment: Goal: General experience of comfort will improve Outcome: Progressing

## 2023-05-04 NOTE — Plan of Care (Signed)
Problem: Clinical Measurements: Goal: Diagnostic test results will improve Outcome: Progressing   Problem: Activity: Goal: Risk for activity intolerance will decrease Outcome: Progressing

## 2023-05-05 ENCOUNTER — Inpatient Hospital Stay (HOSPITAL_COMMUNITY): Payer: Medicare PPO

## 2023-05-05 ENCOUNTER — Ambulatory Visit: Payer: Medicare PPO

## 2023-05-05 DIAGNOSIS — K56609 Unspecified intestinal obstruction, unspecified as to partial versus complete obstruction: Secondary | ICD-10-CM | POA: Diagnosis not present

## 2023-05-05 LAB — CBC
HCT: 36.9 % (ref 36.0–46.0)
Hemoglobin: 11.4 g/dL — ABNORMAL LOW (ref 12.0–15.0)
MCH: 29.9 pg (ref 26.0–34.0)
MCHC: 30.9 g/dL (ref 30.0–36.0)
MCV: 96.9 fL (ref 80.0–100.0)
Platelets: 91 10*3/uL — ABNORMAL LOW (ref 150–400)
RBC: 3.81 MIL/uL — ABNORMAL LOW (ref 3.87–5.11)
RDW: 14.5 % (ref 11.5–15.5)
WBC: 6.8 10*3/uL (ref 4.0–10.5)
nRBC: 0 % (ref 0.0–0.2)

## 2023-05-05 LAB — GLUCOSE, CAPILLARY
Glucose-Capillary: 100 mg/dL — ABNORMAL HIGH (ref 70–99)
Glucose-Capillary: 82 mg/dL (ref 70–99)
Glucose-Capillary: 84 mg/dL (ref 70–99)
Glucose-Capillary: 94 mg/dL (ref 70–99)
Glucose-Capillary: 96 mg/dL (ref 70–99)

## 2023-05-05 LAB — CEA: CEA: 1.5 ng/mL (ref 0.0–4.7)

## 2023-05-05 NOTE — Progress Notes (Signed)
PROGRESS NOTE    Patricia Peters  ZOX:[id number] DOB: 10-25-1935 DOA: 05/02/2023 PCP: Ollen Bowl, MD   Brief Narrative: DONNEISHA LUMMIS is a 87 y.o. female with a history of cholecystectomy.  Patient presented secondary to extreme fatigue, upper abdominal pain, nausea, vomiting and diarrhea. Patient was found to have evidence of influenza A infection in addition to SBO with transition point. General surgery consulted with recommendation for conservative management. NG tube placed. Patient started on oseltamivir for influenza A treatment.   Assessment and Plan:  Small bowel obstruction Noted on CT scan. Transition point located in the right mid/low abdomen. General surgery consulted with recommendation for conservative management. NG tube ordered to be placed. Zosyn IV started. -General surgery recommendations: NG tube with intermittent suction, small bowel protocol (9/15; minimal contrast in the colon) -IV fluids  Cecal inflammation/enhancement Concern for appendicitis versus cecal mass/cancer. General surgery recommends inpatient colonoscopy. [facility], Dr. Dulce Sellar, consulted for consideration of inpatient colonoscopy; called on 9/15 with agreement that inpatient colonoscopy is warranted but to re-consult once SBO resolved  Influenza A infection Mild symptoms. On room air. No evidence of pneumonia. Patient started on oseltamivir. -Continue oseltamivir and supportive care  Abnormal urinalysis Patient without urinary symptoms. Pyuria and bacteria noted on urinalysis. Urine culture ordered. Likely asymptomatic bacteruria. No treatment needed.  Abdominopelvic ascites Noted on CT scan and measured as small to moderate. Unclear etiology.  Thrombocytopenia Mild. Likely reactive secondary to acute illness. Still downtrending. -CBC in AMa  Hyperglycemia Noted. Patient's hemoglobin A1C places her in prediabetes range. -Will need to consider metformin vs dietary modification  depending on patient's goals  Dehydration Secondary to nausea/vomiting. IV fluids started. -Continue IV fluids  Bilateral pleural effusions Mild. Noted on CT scan incidentally. Asymptomatic.  Abdominal aortic aneurysm Incidental finding. Aneurysm is infrarenal and measures 2.8 cm. Recommendation for outpatient vascular surgery follow-up.   DVT prophylaxis: SCDs Code Status:   Code Status: Full Code Family Communication: None at bedside Disposition Plan: Discharge pending improvement in obstruction/general surgery recommendations   Consultants:  General surgery  Procedures:  None  Antimicrobials: Oseltamivir    Subjective: Feels good after ambulation. Still with some abdominal pain/tenderness. Mild non-productive cough. Afebrile.  Objective: BP (!) 165/84   Pulse 84   Temp 98.4 F (36.9 C) (Oral)   Resp 17   Ht 5\' 6"  (1.676 m)   Wt 68.9 kg   SpO2 94%   BMI 24.53 kg/m   Examination:  General exam: Appears calm and comfortable Respiratory system: Clear to auscultation. Respiratory effort normal. Cardiovascular system: S1 & S2 heard, RRR. Gastrointestinal system: Abdomen is distended, soft and mildly tender. Normal bowel sounds heard. Central nervous system: Alert and oriented. No focal neurological deficits. Musculoskeletal: No calf tenderness Skin: No cyanosis. No rashes Psychiatry: Judgement and insight appear normal. Mood & affect appropriate.    Data Reviewed: I have personally reviewed following labs and imaging studies  CBC Lab Results  Component Value Date   WBC 6.8 05/05/2023   RBC 3.81 (L) 05/05/2023   HGB 11.4 (L) 05/05/2023   HCT 36.9 05/05/2023   MCV 96.9 05/05/2023   MCH 29.9 05/05/2023   PLT 91 (L) 05/05/2023   MCHC 30.9 05/05/2023   RDW 14.5 05/05/2023     Last metabolic panel Lab Results  Component Value Date   NA 140 05/04/2023   K 3.7 05/04/2023   CL 104 05/04/2023   CO2 28 05/04/2023   BUN 41 (H) 05/04/2023   CREATININE  1.00 05/04/2023   GLUCOSE 133 (H) 05/04/2023   GFRNONAA 55 (L) 05/04/2023   CALCIUM 8.5 (L) 05/04/2023   PROT 6.6 05/02/2023   ALBUMIN 3.9 05/02/2023   BILITOT 0.7 05/02/2023   ALKPHOS 38 05/02/2023   AST 26 05/02/2023   ALT 17 05/02/2023   ANIONGAP 8 05/04/2023    GFR: Estimated Creatinine Clearance: 38.5 mL/min (by C-G formula based on SCr of 1 mg/dL).  Recent Results (from the past 240 hour(s))  Resp panel by RT-PCR (RSV, Flu A&B, Covid) Anterior Nasal Swab     Status: Abnormal   Collection Time: 05/02/23  3:48 PM   Specimen: Anterior Nasal Swab  Result Value Ref Range Status   SARS Coronavirus 2 by RT PCR NEGATIVE NEGATIVE Final    Comment: (NOTE) SARS-CoV-2 target nucleic acids are NOT DETECTED.  The SARS-CoV-2 RNA is generally detectable in upper respiratory specimens during the acute phase of infection. The lowest concentration of SARS-CoV-2 viral copies this assay can detect is 138 copies/mL. A negative result does not preclude SARS-Cov-2 infection and should not be used as the sole basis for treatment or other patient management decisions. A negative result may occur with  improper specimen collection/handling, submission of specimen other than nasopharyngeal swab, presence of viral mutation(s) within the areas targeted by this assay, and inadequate number of viral copies(<138 copies/mL). A negative result must be combined with clinical observations, patient history, and epidemiological information. The expected result is Negative.  Fact Sheet for Patients:  BloggerCourse.com  Fact Sheet for Healthcare Providers:  SeriousBroker.it  This test is no t yet approved or cleared by the [place] FDA and  has been authorized for detection and/or diagnosis of SARS-CoV-2 by FDA under an Emergency Use Authorization (EUA). This EUA will remain  in effect (meaning this test can be used) for the duration of the COVID-19  declaration under Section 564(b)(1) of the Act, 21 U.S.C.section 360bbb-3(b)(1), unless the authorization is terminated  or revoked sooner.       Influenza A by PCR POSITIVE (A) NEGATIVE Final   Influenza B by PCR NEGATIVE NEGATIVE Final    Comment: (NOTE) The Xpert Xpress SARS-CoV-2/FLU/RSV plus assay is intended as an aid in the diagnosis of influenza from Nasopharyngeal swab specimens and should not be used as a sole basis for treatment. Nasal washings and aspirates are unacceptable for Xpert Xpress SARS-CoV-2/FLU/RSV testing.  Fact Sheet for Patients: BloggerCourse.com  Fact Sheet for Healthcare Providers: SeriousBroker.it  This test is not yet approved or cleared by the [place] FDA and has been authorized for detection and/or diagnosis of SARS-CoV-2 by FDA under an Emergency Use Authorization (EUA). This EUA will remain in effect (meaning this test can be used) for the duration of the COVID-19 declaration under Section 564(b)(1) of the Act, 21 U.S.C. section 360bbb-3(b)(1), unless the authorization is terminated or revoked.     Resp Syncytial Virus by PCR NEGATIVE NEGATIVE Final    Comment: (NOTE) Fact Sheet for Patients: BloggerCourse.com  Fact Sheet for Healthcare Providers: SeriousBroker.it  This test is not yet approved or cleared by the [place] FDA and has been authorized for detection and/or diagnosis of SARS-CoV-2 by FDA under an Emergency Use Authorization (EUA). This EUA will remain in effect (meaning this test can be used) for the duration of the COVID-19 declaration under Section 564(b)(1) of the Act, 21 U.S.C. section 360bbb-3(b)(1), unless the authorization is terminated or revoked.  Performed at [facility], [address], [place], [place] [address]  Urine Culture     Status: Abnormal   Collection Time: 05/02/23  6:05  PM   Specimen: Urine, Clean Catch  Result Value Ref Range Status   Specimen Description   Final    URINE, CLEAN CATCH Performed at [facility], [address] [place], [place] [address]    Special Requests   Final    NONE Performed at [facility], [address], [place], [place] [address]    Culture MULTIPLE SPECIES PRESENT, SUGGEST RECOLLECTION (A)  Final   Report Status 05/03/2023 FINAL  Final  Blood culture (routine x 2)     Status: None (Preliminary result)   Collection Time: 05/02/23  8:30 PM   Specimen: BLOOD RIGHT ARM  Result Value Ref Range Status   Specimen Description   Final    BLOOD RIGHT ARM Performed at [facility], [address] [place], [place] [address]    Special Requests   Final    BOTTLES DRAWN AEROBIC AND ANAEROBIC Blood Culture adequate volume Performed at [facility], [address] [place], [place] [address]    Culture   Final    NO GROWTH 2 DAYS Performed at [facility], [address] [place], [place] [address]    Report Status PENDING  Incomplete  Blood culture (routine x 2)     Status: None (Preliminary result)   Collection Time: 05/02/23  8:39 PM   Specimen: BLOOD RIGHT ARM  Result Value Ref Range Status   Specimen Description   Final    BLOOD RIGHT ARM Performed at [facility], [address], [place], [place] [address]    Special Requests   Final    BOTTLES DRAWN AEROBIC ONLY Blood Culture adequate volume Performed at [facility], [address], [place], [place] [address]    Culture   Final    NO GROWTH 2 DAYS Performed at [facility], [address], [place], [place] [address]    Report Status PENDING  Incomplete      Radiology Studies: DG Abd Portable 1V  Result Date: 05/05/2023 CLINICAL DATA:  [id number] with small bowel obstruction.  Follow-up. EXAM: PORTABLE ABDOMEN - 1 VIEW COMPARISON:  Portable study yesterday at  3:23 p.m. FINDINGS: 5:06 a.m. NGT tip is in the right upper quadrant either in the most distal gastric antrum or in the duodenal bulb. It is probably in the distal antrum based on the CT of 05/02/2023. There is persistent dilatation of small bowel, maximum caliber in the left lower quadrant 4.5 cm, previously 5.1 cm, mildly improved. There is scattered gas and stool in the large bowel at least into the distal sigmoid segment. There is increased faint contrast opacification in the ascending and transverse colon. There is no supine evidence of free air. Right upper quadrant surgical clips. IMPRESSION: 1. NGT tip is in the right upper quadrant either in the most distal gastric antrum or in the duodenal bulb. 2. Persistent dilatation of small bowel, maximum caliber in the left lower quadrant 4.5 cm, previously 5.1 cm. 3. Increased faint contrast opacification in the ascending and transverse colon. 4. No other change. Electronically Signed   By: Almira Bar M.D.   On: 05/05/2023 06:41   DG Abd Portable 1V  Result Date: 05/04/2023 CLINICAL DATA:  Small-bowel obstruction EXAM: PORTABLE ABDOMEN - 1 VIEW COMPARISON:  05/03/2023, 05/02/2023 FINDINGS: Enteric tube remains in place. Persistently dilated loops of small bowel, predominantly within  the left hemiabdomen. Progressive transit of contrast through the small bowel. Contrast has become more dilute compared to the previous study. Contrast has not definitively reached the large bowel, based on position of the cecum in the previous CT. IMPRESSION: Persistently dilated loops of small bowel, predominantly within the left hemiabdomen. Progressive transit of contrast through the small bowel. Contrast has not definitively reached the large bowel. Electronically Signed   By: Duanne Guess D.O.   On: 05/04/2023 19:24   DG Abd Portable 1V-Small Bowel Obstruction Protocol-initial, 8 hr delay  Result Date: 05/03/2023 CLINICAL DATA:  Small-bowel obstruction EXAM: PORTABLE  ABDOMEN - 1 VIEW COMPARISON:  Film from earlier in the same day. FINDINGS: Gastric catheter is again identified. Administered contrast now is distributed throughout multiple dilated loops of small bowel. No definitive colonic contrast is seen. IMPRESSION: Contrast scattered throughout multiple dilated loops of small bowel. No colonic contrast is seen. 24 hour follow-up film is recommended. Electronically Signed   By: Alcide Clever M.D.   On: 05/03/2023 19:44   DG Abd 1 View  Result Date: 05/03/2023 CLINICAL DATA:  Small bowel obstruction. EXAM: ABDOMEN - 1 VIEW COMPARISON:  CT scan from 1 day prior. FINDINGS: Contrast material opacifies the distal stomach and duodenum. Transverse duodenum is dilated up to 5.5 cm. Contrast material has not crossed the midline. Diffuse gaseous small bowel distention evident. IMPRESSION: 1. Contrast material opacifies the distal stomach and dilated duodenum. Duodenal contrast material has not yet passed the midline. 2. Diffuse gaseous small bowel distention. Electronically Signed   By: Kennith Center M.D.   On: 05/03/2023 11:30      LOS: 3 days    Jacquelin Hawking, MD [facility] 05/05/2023, 10:33 AM   If 7PM-7AM, please contact night-coverage [email or web address]

## 2023-05-05 NOTE — Progress Notes (Addendum)
Subjective: CC: Her daughter is at bedside. Reports patient has hx of ex lap for cholecystectomy and untwisting of volvulus at the same time. No other abdominal surgeries. No hx of prior colonoscopy. Patient has a family hx of colon ca in her father. Patients mother has hx of UC and her daughter has Ulcerative Proctitis.   Some central abdominal pain today and still having nausea but thinks she feels a little better. No flatus. Very small hard bm last night per staff. Up walking in the halls this am.   Afebrile. No tachycardia or hypotension. WBC 6.8. NGT w/ 555cc/24 hours. Xray this am w/ faint contrast in the ascending and transverse colon but persistent dilated small bowel.   Objective: Vital signs in last 24 hours: Temp:  [98.3 F (36.8 C)-98.6 F (37 C)] 98.4 F (36.9 C) (09/17 0541) Pulse Rate:  [84-92] 84 (09/17 0658) Resp:  [16-18] 17 (09/17 0541) BP: (156-165)/(80-91) 165/84 (09/17 0658) SpO2:  [91 %-94 %] 94 % (09/17 0541) Last BM Date : 04/30/23  Intake/Output from previous day: 09/16 0701 - 09/17 0700 In: 1860.2 [P.O.:90; I.V.:1747.7; IV Piggyback:22.4] Out: 955 [Urine:400; Emesis/NG output:555] Intake/Output this shift: No intake/output data recorded.  PE: Gen:  Alert, NAD, pleasant Abd: Soft, mild to moderate distension, periumbilical ttp without rigidity or guarding. Otherwise NT. NGT with thin brown output in cannister. Prior midline scar well healed    Lab Results:  Recent Labs    05/04/23 0444 05/05/23 0457  WBC 8.8 6.8  HGB 12.9 11.4*  HCT 40.5 36.9  PLT 95* 91*   BMET Recent Labs    05/03/23 0430 05/04/23 0444  NA 135 140  K 4.2 3.7  CL 102 104  CO2 25 28  GLUCOSE 175* 133*  BUN 48* 41*  CREATININE 0.99 1.00  CALCIUM 8.5* 8.5*   PT/INR No results for input(s): "LABPROT", "INR" in the last 72 hours. CMP     Component Value Date/Time   NA 140 05/04/2023 0444   K 3.7 05/04/2023 0444   CL 104 05/04/2023 0444   CO2 28 05/04/2023  0444   GLUCOSE 133 (H) 05/04/2023 0444   BUN 41 (H) 05/04/2023 0444   CREATININE 1.00 05/04/2023 0444   CALCIUM 8.5 (L) 05/04/2023 0444   PROT 6.6 05/02/2023 1621   ALBUMIN 3.9 05/02/2023 1621   AST 26 05/02/2023 1621   ALT 17 05/02/2023 1621   ALKPHOS 38 05/02/2023 1621   BILITOT 0.7 05/02/2023 1621   GFRNONAA 55 (L) 05/04/2023 0444   Lipase     Component Value Date/Time   LIPASE <10 (L) 05/02/2023 1621    Studies/Results: DG Abd Portable 1V  Result Date: 05/05/2023 CLINICAL DATA:  [id number] with small bowel obstruction.  Follow-up. EXAM: PORTABLE ABDOMEN - 1 VIEW COMPARISON:  Portable study yesterday at 3:23 p.m. FINDINGS: 5:06 a.m. NGT tip is in the right upper quadrant either in the most distal gastric antrum or in the duodenal bulb. It is probably in the distal antrum based on the CT of 05/02/2023. There is persistent dilatation of small bowel, maximum caliber in the left lower quadrant 4.5 cm, previously 5.1 cm, mildly improved. There is scattered gas and stool in the large bowel at least into the distal sigmoid segment. There is increased faint contrast opacification in the ascending and transverse colon. There is no supine evidence of free air. Right upper quadrant surgical clips. IMPRESSION: 1. NGT tip is in the right upper quadrant either in the  most distal gastric antrum or in the duodenal bulb. 2. Persistent dilatation of small bowel, maximum caliber in the left lower quadrant 4.5 cm, previously 5.1 cm. 3. Increased faint contrast opacification in the ascending and transverse colon. 4. No other change. Electronically Signed   By: Almira Bar M.D.   On: 05/05/2023 06:41   DG Abd Portable 1V  Result Date: 05/04/2023 CLINICAL DATA:  Small-bowel obstruction EXAM: PORTABLE ABDOMEN - 1 VIEW COMPARISON:  05/03/2023, 05/02/2023 FINDINGS: Enteric tube remains in place. Persistently dilated loops of small bowel, predominantly within the left hemiabdomen. Progressive transit of contrast  through the small bowel. Contrast has become more dilute compared to the previous study. Contrast has not definitively reached the large bowel, based on position of the cecum in the previous CT. IMPRESSION: Persistently dilated loops of small bowel, predominantly within the left hemiabdomen. Progressive transit of contrast through the small bowel. Contrast has not definitively reached the large bowel. Electronically Signed   By: Duanne Guess D.O.   On: 05/04/2023 19:24   DG Abd Portable 1V-Small Bowel Obstruction Protocol-initial, 8 hr delay  Result Date: 05/03/2023 CLINICAL DATA:  Small-bowel obstruction EXAM: PORTABLE ABDOMEN - 1 VIEW COMPARISON:  Film from earlier in the same day. FINDINGS: Gastric catheter is again identified. Administered contrast now is distributed throughout multiple dilated loops of small bowel. No definitive colonic contrast is seen. IMPRESSION: Contrast scattered throughout multiple dilated loops of small bowel. No colonic contrast is seen. 24 hour follow-up film is recommended. Electronically Signed   By: Alcide Clever M.D.   On: 05/03/2023 19:44   DG Abd 1 View  Result Date: 05/03/2023 CLINICAL DATA:  Small bowel obstruction. EXAM: ABDOMEN - 1 VIEW COMPARISON:  CT scan from 1 day prior. FINDINGS: Contrast material opacifies the distal stomach and duodenum. Transverse duodenum is dilated up to 5.5 cm. Contrast material has not crossed the midline. Diffuse gaseous small bowel distention evident. IMPRESSION: 1. Contrast material opacifies the distal stomach and dilated duodenum. Duodenal contrast material has not yet passed the midline. 2. Diffuse gaseous small bowel distention. Electronically Signed   By: Kennith Center M.D.   On: 05/03/2023 11:30    Anti-infectives: Anti-infectives (From admission, onward)    Start     Dose/Rate Route Frequency Ordered Stop   05/03/23 1000  piperacillin-tazobactam (ZOSYN) IVPB 3.375 g  Status:  Discontinued        3.375 g 12.5 mL/hr over  240 Minutes Intravenous Every 8 hours 05/02/23 2213 05/04/23 1040   05/02/23 2200  oseltamivir (TAMIFLU) capsule 30 mg        30 mg Oral 2 times daily 05/02/23 2131 05/07/23 2159   05/02/23 1800  piperacillin-tazobactam (ZOSYN) IVPB 3.375 g        3.375 g 12.5 mL/hr over 240 Minutes Intravenous  Once 05/02/23 1759 05/03/23 0542        Assessment/Plan SBO - CT w/ small bowel obstruction with transition in the right mid/lower abdomen. There is also enhancement/inflammation along the medial cecum. Patient denies hx of appendectomy. As stated in Dr. Michaell Cowing note yesterday, there is concern about a mass in her cecum that may be causing partial obstruction versus this being a separate finding. [facility] has been consulted for Colonoscopy - they would be agreeable to colonoscopy if SBO resolves.  - HDS without fever, tachycardia or hypotension. No peritonitis on exam. WBC 6.8. No current indication for emergency surgery - Patient does have contrast in her colon on xray today but small  bowel is also still dilated and she is symptomatic with central abdominal pain and nausea. Will leave NPO, NGT to LIWS today and repeat Xray in the AM.  - Keep K > 4, Mg > 2 and mobilize as able for bowel function - If patient fails to improve with conservative management she may require exploratory surgery during admission. If she was to improve with conservative management would recommend colonoscopy by GI while inpatient.  - We will follow with you.  FEN - NPO, NGT to LIWS, IVF per primary  VTE - SCDs, okay for chemical ppx from our standpoint ID - None currently.   Influenza A AAA  I reviewed nursing notes, hospitalist notes, last 24 h vitals and pain scores, last 48 h intake and output, last 24 h labs and trends, and last 24 h imaging results. Discussed with TRH in person.    LOS: 3 days    Jacinto Halim , [facility] 05/05/2023, 10:29 AM Please see Amion for pager number during day  hours 7:00am-4:30pm

## 2023-05-05 NOTE — Evaluation (Signed)
Occupational Therapy Evaluation Patient Details Name: Patricia Peters MRN: [id number] DOB: 1935/11/02 Today's Date: 05/05/2023   History of Present Illness Patient is a 87 year old female admitted with SBO. Imaging: appendicitis vs cecal mass per general surgery. Flu (+). Hx of choecystectomy, AAA   Clinical Impression   Patient is a 87 year old female who was admitted for above. Patient was living at home with husband independently prior level. Currently, patient is easily fatigued with nausea with movement. Patient was able to participate in toileting tasks with min A with increased time. Patient plans to d/c home with husband and daughter support at time of d/c. Patient would continue to benefit from skilled OT services at this time while admitted and after d/c to address noted deficits in order to improve overall safety and independence in ADLs.        If plan is discharge home, recommend the following: A little help with walking and/or transfers;A lot of help with bathing/dressing/bathroom;Assistance with cooking/housework;Direct supervision/assist for medications management;Assist for transportation;Help with stairs or ramp for entrance;Direct supervision/assist for financial management    Functional Status Assessment  Patient has had a recent decline in their functional status and demonstrates the ability to make significant improvements in function in a reasonable and predictable amount of time.  Equipment Recommendations  None recommended by OT       Precautions / Restrictions Precautions Precautions: Fall Precaution Comments: NG tube; nausea with movement Restrictions Weight Bearing Restrictions: No      Mobility Bed Mobility Overal bed mobility: Needs Assistance Bed Mobility: Supine to Sit, Sit to Supine     Supine to sit: Supervision, HOB elevated Sit to supine: Supervision, HOB elevated   General bed mobility comments: Supv for lines            Balance  Overall balance assessment: Needs assistance         Standing balance support: During functional activity Standing balance-Leahy Scale: Fair           ADL either performed or assessed with clinical judgement   ADL Overall ADL's : Needs assistance/impaired Eating/Feeding: NPO   Grooming: Sitting;Minimal assistance   Upper Body Bathing: Minimal assistance;Sitting   Lower Body Bathing: Sitting/lateral leans;Sit to/from stand;Moderate assistance   Upper Body Dressing : Minimal assistance;Sitting   Lower Body Dressing: Moderate assistance;Sit to/from stand;Sitting/lateral leans   Toilet Transfer: Minimal assistance;Ambulation Toilet Transfer Details (indicate cue type and reason): using IV pole to transition into the bathroom with cues for obstacle navigation. Toileting- Clothing Manipulation and Hygiene: Maximal assistance;Sit to/from stand                            Pertinent Vitals/Pain Pain Assessment Pain Assessment: Faces Faces Pain Scale: Hurts a little bit Pain Location: generalized Pain Descriptors / Indicators: Discomfort, Grimacing Pain Intervention(s): Limited activity within patient's tolerance, Monitored during session     Extremity/Trunk Assessment Upper Extremity Assessment Upper Extremity Assessment: Overall WFL for tasks assessed   Lower Extremity Assessment Lower Extremity Assessment: Defer to PT evaluation   Cervical / Trunk Assessment Cervical / Trunk Assessment: Normal   Communication Communication Communication: No apparent difficulties   Cognition Arousal: Alert Behavior During Therapy: WFL for tasks assessed/performed Overall Cognitive Status: Within Functional Limits for tasks assessed         General Comments: reported feeling better today, daughter and husband present during session.  Home Living Family/patient expects to be discharged to:: Unsure Living Arrangements: Spouse/significant other   Type  of Home: House Home Access: Stairs to enter [employer] of Steps: 2 Entrance Stairs-Rails: None Home Layout: Two level Alternate Level Stairs-Number of Steps: 1 flight                        Prior Functioning/Environment Prior Level of Function : Independent/Modified Independent             Mobility Comments: uses "walking stick" outside of home          OT Problem List: Decreased activity tolerance;Impaired balance (sitting and/or standing);Decreased coordination;Decreased safety awareness;Decreased knowledge of precautions;Decreased knowledge of use of DME or AE      OT Treatment/Interventions: Self-care/ADL training;Therapeutic exercise;DME and/or AE instruction;Therapeutic activities;Patient/family education;Balance training    OT Goals(Current goals can be found in the care plan section) Acute Rehab OT Goals Patient Stated Goal: to get back in bed. OT Goal Formulation: With patient Time For Goal Achievement: 05/19/23 Potential to Achieve Goals: Fair  OT Frequency: Min 1X/week       AM-PAC OT "6 Clicks" Daily Activity     Outcome Measure Help from another person eating meals?: Total (NPO) Help from another person taking care of personal grooming?: A Little Help from another person toileting, which includes using toliet, bedpan, or urinal?: A Lot Help from another person bathing (including washing, rinsing, drying)?: A Lot Help from another person to put on and taking off regular upper body clothing?: A Little Help from another person to put on and taking off regular lower body clothing?: A Lot 6 Click Score: 13   End of Session Nurse Communication: Mobility status  Activity Tolerance: Patient limited by fatigue Patient left: in bed;with call bell/phone within reach;with family/visitor present  OT Visit Diagnosis: Unsteadiness on feet (R26.81);Other abnormalities of gait and mobility (R26.89)                Time: 5284-1324 OT Time  Calculation (min): 21 min Charges:  OT General Charges $OT Visit: 1 Visit OT Evaluation $OT Eval Low Complexity: 1 Low  Breniyah Romm OTR/L, MS Acute Rehabilitation Department Office# [phone]   Selinda Flavin 05/05/2023, 4:24 PM

## 2023-05-05 NOTE — Evaluation (Signed)
Physical Therapy Evaluation Patient Details Name: Patricia Peters MRN: [id number] DOB: Nov 28, 1935 Today's Date: 05/05/2023  History of Present Illness  87 yo female admitted with SBO. Imaging: appendicitis vs cecal mass per general surgery. Flu (+). Hx of choecystectomy, AAA  Clinical Impression  On eval, pt required Min A for mobility. She walked ~175 feet while using IV pole for steadying support. Pt tolerated activity fairly well-some nausea with movement. Encouraged pt to ambulate throughout the day as able. Will ask mobility team to assist with mobilizing pt as well. At this time, PT recommendation is for HHPT f/u but this will be dependent on pt progress and medical course.         If plan is discharge home, recommend the following: A little help with walking and/or transfers;A little help with bathing/dressing/bathroom;Help with stairs or ramp for entrance   Can travel by private vehicle        Equipment Recommendations None recommended by PT (continuing to assess)  Recommendations for Other Services       Functional Status Assessment Patient has had a recent decline in their functional status and demonstrates the ability to make significant improvements in function in a reasonable and predictable amount of time.     Precautions / Restrictions Precautions Precautions: Fall Precaution Comments: NG tube; nausea with movement Restrictions Weight Bearing Restrictions: No      Mobility  Bed Mobility Overal bed mobility: Needs Assistance Bed Mobility: Supine to Sit, Sit to Supine     Supine to sit: Supervision, HOB elevated Sit to supine: Supervision, HOB elevated   General bed mobility comments: Supv for lines    Transfers Overall transfer level: Needs assistance Equipment used: None Transfers: Sit to/from Stand Sit to Stand: Contact guard assist           General transfer comment: Increased time.    Ambulation/Gait Ambulation/Gait assistance: Min  assist Gait Distance (Feet): 175 Feet Assistive device: IV Pole Gait Pattern/deviations: Step-through pattern, Decreased stride length       General Gait Details: Intermittent assist to steady, especially with turns/changes in direction. Tolerated distance well. Nausea.  Stairs            Wheelchair Mobility     Tilt Bed    Modified Rankin (Stroke Patients Only)       Balance Overall balance assessment: Needs assistance         Standing balance support: During functional activity Standing balance-Leahy Scale: Fair                               Pertinent Vitals/Pain Pain Assessment Pain Assessment: Faces Faces Pain Scale: Hurts a little bit Pain Location: generalized    Home Living Family/patient expects to be discharged to:: Unsure Living Arrangements: Spouse/significant other (recent d/c from hospital)   Type of Home: House Home Access: Stairs to enter Entrance Stairs-Rails: None Entrance Stairs-Number of Steps: 2 Alternate Level Stairs-Number of Steps: 1 flight Home Layout: Two level Home Equipment:  ("walking stick")      Prior Function Prior Level of Function : Independent/Modified Independent             Mobility Comments: uses "walking stick" outside of home       Extremity/Trunk Assessment   Upper Extremity Assessment Upper Extremity Assessment: Defer to OT evaluation    Lower Extremity Assessment Lower Extremity Assessment: Generalized weakness    Cervical / Trunk Assessment Cervical / Trunk  Assessment: Normal  Communication   Communication Communication: No apparent difficulties  Cognition Arousal: Alert Behavior During Therapy: WFL for tasks assessed/performed Overall Cognitive Status: Within Functional Limits for tasks assessed                                 General Comments: a bit teaful initially but perked up a bit after walking        General Comments      Exercises      Assessment/Plan    PT Assessment Patient needs continued PT services  PT Problem List Decreased strength;Decreased activity tolerance;Decreased balance;Decreased mobility;Decreased knowledge of use of DME       PT Treatment Interventions DME instruction;Gait training;Functional mobility training;Therapeutic activities;Therapeutic exercise;Balance training;Patient/family education    PT Goals (Current goals can be found in the Care Plan section)  Acute Rehab PT Goals Patient Stated Goal: regain PLOF/independence; get better PT Goal Formulation: With patient/family Time For Goal Achievement: 05/19/23 Potential to Achieve Goals: Good    Frequency Min 1X/week     Co-evaluation               AM-PAC PT "6 Clicks" Mobility  Outcome Measure Help needed turning from your back to your side while in a flat bed without using bedrails?: A Little Help needed moving from lying on your back to sitting on the side of a flat bed without using bedrails?: A Little Help needed moving to and from a bed to a chair (including a wheelchair)?: A Little Help needed standing up from a chair using your arms (e.g., wheelchair or bedside chair)?: A Little Help needed to walk in hospital room?: A Little Help needed climbing 3-5 steps with a railing? : A Little 6 Click Score: 18    End of Session Equipment Utilized During Treatment: Gait belt Activity Tolerance: Patient tolerated treatment well Patient left: in bed;with call bell/phone within reach;with bed alarm set;with family/visitor present   PT Visit Diagnosis: Muscle weakness (generalized) (M62.81);Difficulty in walking, not elsewhere classified (R26.2)    Time: 1000-1017 PT Time Calculation (min) (ACUTE ONLY): 17 min   Charges:   PT Evaluation $PT Eval Low Complexity: 1 Low   PT General Charges $$ ACUTE PT VISIT: 1 Visit           Faye Ramsay, PT Acute Rehabilitation  Office: [phone]

## 2023-05-05 NOTE — Plan of Care (Signed)
Problem: Education: Goal: Knowledge of General Education information will improve Description Including pain rating scale, medication(s)/side effects and non-pharmacologic comfort measures Outcome: Progressing   Problem: Education: Goal: Knowledge of General Education information will improve Description Including pain rating scale, medication(s)/side effects and non-pharmacologic comfort measures Outcome: Progressing

## 2023-05-06 ENCOUNTER — Inpatient Hospital Stay (HOSPITAL_COMMUNITY): Payer: Medicare PPO

## 2023-05-06 DIAGNOSIS — K56609 Unspecified intestinal obstruction, unspecified as to partial versus complete obstruction: Secondary | ICD-10-CM | POA: Diagnosis not present

## 2023-05-06 LAB — GLUCOSE, CAPILLARY
Glucose-Capillary: 76 mg/dL (ref 70–99)
Glucose-Capillary: 79 mg/dL (ref 70–99)
Glucose-Capillary: 86 mg/dL (ref 70–99)
Glucose-Capillary: 89 mg/dL (ref 70–99)

## 2023-05-06 MED ORDER — GUAIFENESIN-DM 100-10 MG/5ML PO SYRP
5.0000 mL | ORAL_SOLUTION | ORAL | Status: DC | PRN
  Administered 2023-05-06 – 2023-05-15 (×5): 5 mL via ORAL
  Filled 2023-05-06 (×6): qty 10

## 2023-05-06 MED ORDER — ALBUTEROL SULFATE (2.5 MG/3ML) 0.083% IN NEBU: 2.5000 mg | INHALATION_SOLUTION | RESPIRATORY_TRACT | Status: DC | PRN

## 2023-05-06 MED ORDER — PANTOPRAZOLE SODIUM 40 MG PO TBEC
40.0000 mg | DELAYED_RELEASE_TABLET | Freq: Every day | ORAL | Status: DC
  Administered 2023-05-07 – 2023-05-08 (×2): 40 mg via ORAL
  Filled 2023-05-06 (×3): qty 1

## 2023-05-06 MED ORDER — IPRATROPIUM-ALBUTEROL 0.5-2.5 (3) MG/3ML IN SOLN: 3.0000 mL | Freq: Three times a day (TID) | RESPIRATORY_TRACT | Status: DC

## 2023-05-06 MED ORDER — POTASSIUM CHLORIDE 10 MEQ/100ML IV SOLN
10.0000 meq | INTRAVENOUS | Status: AC
  Administered 2023-05-06 (×4): 10 meq via INTRAVENOUS
  Filled 2023-05-06 (×4): qty 100

## 2023-05-06 NOTE — Progress Notes (Signed)
PROGRESS NOTE Patricia Peters  YQM:[id number] DOB: February 20, 1936 DOA: 05/02/2023 PCP: Ollen Bowl, MD  Brief Narrative/Hospital Course: Patricia Peters is a 87 y.o. female with a history of cholecystectomy.  Patient presented secondary to extreme fatigue, upper abdominal pain, nausea, vomiting and diarrhea. Patient was found to have evidence of influenza A infection in addition to SBO with transition point. General surgery consulted with recommendation for conservative management. NG tube placed. Patient started on oseltamivir for influenza A treatment.    Subjective: Patient seen and examined this morning Complains of cough and throat pain and feeling better after Chloraseptic spray Has not had a bowel movement or flatus Daughter at the bedside Overnight patient has been afebrile BP 160s to 170s systolic, on room air Labs with hypokalemia 3.1, thrombocytopenia  Assessment and Plan: Principal Problem:   SBO (small bowel obstruction) (HCC) Active Problems:   Anxiety   Influenza due to identified novel influenza A virus with other respiratory manifestations   Infrarenal abdominal aortic aneurysm (AAA) without rupture (HCC)   Hyperglycemia   Dehydration   Hypoxia   Thrombocytopenia (HCC)   CKD (chronic kidney disease) stage 2, GFR 60-89 ml/min   Memory changes  SBO: Patient had CT scan WITH transition point in right mid/lower abdomen, CCS following on conservative management with NG tube decompression, on IV antibiotics.  Continue IVF n.p.o. and small bowel protocol per surgery -does have contrast; on x-ray 9/17 but still with small bowel dilatation and symptomatic with pain and nausea.  Keep mag above 2K above 4, if fails to improve may require exploratory surgery, plus minus colonoscopy-DEFER plan to surgery   Enhancement/inflammation along the medial cecum without discrete/visualize appendix Small to moderate abdominopelvic ascites: If patient had a prior appendicectomy appearance it  would be super cecal mass/cancer, acute appendicitis is also possible. [facility], Dr. Dulce Sellar, consulted for consideration of inpatient colonoscopy; called on 9/15 with agreement that inpatient colonoscopy is warranted but to re-consult once SBO resolved   Influenza A infection: Mild symptoms, not hypoxic, no pneumonia.  She has cough does have wheezing, her symptoms started last Thursday continue oseltamivir to complete course.  Add bronchodilators/nebs.   Abnormal urinalysis Patient without urinary symptoms. Pyuria and bacteria noted on urinalysis. Urine culture 9/14 with multiple species. Likely asymptomatic bacteruria. No treatment needed.   Thrombocytopenia: Holding in 90s range likely in the setting of influenza infection/acute illness.Monitor Recent Labs  Lab 05/02/23 1621 05/03/23 0430 05/04/23 0444 05/05/23 0457 05/06/23 0500  PLT 122* 110* 95* 91* 96*     Hypokalemia: Replacing. Recent Labs  Lab 05/02/23 1621 05/03/23 0430 05/04/23 0444 05/06/23 0500  K 4.4 4.2 3.7 3.1*  CALCIUM 9.1 8.5* 8.5* 8.4*  MG  --   --   --  2.0   Hyperglycemia Prediabetes HbA1c: Will recommend dietary modification upon discharge. Recent Labs  Lab 05/03/23 0430 05/03/23 0540 05/05/23 0459 05/05/23 1137 05/05/23 1900 05/05/23 2347 05/06/23 0530  GLUCAP  --    < > 84 100* 82 94 79  HGBA1C 6.4*  --   --   --   --   --   --    < > = values in this interval not displayed.    Dehydration Secondary to nausea/vomiting. Cont ivf   Bilateral pleural effusions mild: Incidentally noted on CT scan, Monitor not hypoxic   Abdominal aortic aneurysm Incidental finding. Aneurysm is infrarenal and measures 2.8 cm. Recommendation for outpatient vascular surgery follow-up.   DVT prophylaxis: Place and maintain sequential  compression device Start: 05/02/23 2129 Code Status:   Code Status: Full Code Family Communication: plan of care discussed with patient/ her daughter at bedside. Patient  status is: Inpatient because of SBO Level of care: Med-Surg   Dispo: The patient is from: home            Anticipated disposition: TBD Objective: Vitals last 24 hrs: Vitals:   05/05/23 1721 05/05/23 2207 05/06/23 0534 05/06/23 0626  BP: (!) 176/91 (!) 169/87 (!) 172/88 (!) 161/82  Pulse: 88 88 86 79  Resp: 16 18 18    Temp: 98.5 F (36.9 C) 98.3 F (36.8 C) 98.4 F (36.9 C)   TempSrc: Oral Oral Oral   SpO2: 92% 91% 96%   Weight:      Height:       Weight change:   Physical Examination: General exam: alert awake, older than stated age, NGT+ HEENT:Oral mucosa moist, Ear/Nose WNL grossly Respiratory system: bilaterally air entry present with expiratory wheezing,no use of accessory muscle Cardiovascular system: S1 & S2 +, No JVD. Gastrointestinal system: Abdomen soft,NT,ND, BS+ Nervous System:Alert, awake, moving extremities. Extremities: LE edema neg,distal peripheral pulses palpable.  Skin: No rashes,no icterus. MSK: Normal muscle bulk,tone, power  Medications reviewed:  Scheduled Meds:  bisacodyl  10 mg Rectal Daily   oseltamivir  30 mg Oral BID   Continuous Infusions:  sodium chloride 75 mL/hr at 05/06/23 0029   methocarbamol (ROBAXIN) IV     ondansetron (ZOFRAN) IV     potassium chloride 10 mEq (05/06/23 1002)      Diet Order             Diet NPO time specified Except for: Ice Chips  Diet effective now                    Intake/Output Summary (Last 24 hours) at 05/06/2023 1113 Last data filed at 05/06/2023 1000 Gross per 24 hour  Intake 1477.64 ml  Output 1075 ml  Net 402.64 ml   Net IO Since Admission: 2,933.1 mL [05/06/23 1113]  Wt Readings from Last 3 Encounters:  05/02/23 68.9 kg  04/06/17 82.6 kg  03/28/17 82.1 kg     Unresulted Labs (From admission, onward)    None     Data Reviewed: I have personally reviewed following labs and imaging studies CBC: Recent Labs  Lab 05/02/23 1621 05/03/23 0430 05/04/23 0444 05/05/23 0457  05/06/23 0500  WBC 11.2* 8.8 8.8 6.8 6.1  HGB 14.4 12.7 12.9 11.4* 11.8*  HCT 43.6 39.0 40.5 36.9 38.3  MCV 90.5 92.6 93.3 96.9 95.5  PLT 122* 110* 95* 91* 96*   Basic Metabolic Panel: Recent Labs  Lab 05/02/23 1621 05/03/23 0430 05/04/23 0444 05/06/23 0500  NA 137 135 140 142  K 4.4 4.2 3.7 3.1*  CL 99 102 104 106  CO2 26 25 28 24   GLUCOSE 169* 175* 133* 85  BUN 45* 48* 41* 24*  CREATININE 1.17* 0.99 1.00 0.69  CALCIUM 9.1 8.5* 8.5* 8.4*  MG  --   --   --  2.0   GFR: Estimated Creatinine Clearance: 48.1 mL/min (by C-G formula based on SCr of 0.69 mg/dL). Liver Function Tests: Recent Labs  Lab 05/02/23 1621 05/06/23 0500  AST 26 30  ALT 17 42  ALKPHOS 38 36*  BILITOT 0.7 1.2  PROT 6.6 5.5*  ALBUMIN 3.9 3.0*   Recent Labs  Lab 05/02/23 1621  LIPASE <10*  CBG: Recent Labs  Lab 05/05/23 0459 05/05/23 1137  05/05/23 1900 05/05/23 2347 05/06/23 0530  GLUCAP 84 100* 82 94 79   Recent Labs  Lab 05/02/23 1742 05/02/23 2030  LATICACIDVEN 1.3 1.5    Recent Results (from the past 240 hour(s))  Resp panel by RT-PCR (RSV, Flu A&B, Covid) Anterior Nasal Swab     Status: Abnormal   Collection Time: 05/02/23  3:48 PM   Specimen: Anterior Nasal Swab  Result Value Ref Range Status   SARS Coronavirus 2 by RT PCR NEGATIVE NEGATIVE Final    Comment: (NOTE) SARS-CoV-2 target nucleic acids are NOT DETECTED.  The SARS-CoV-2 RNA is generally detectable in upper respiratory specimens during the acute phase of infection. The lowest concentration of SARS-CoV-2 viral copies this assay can detect is 138 copies/mL. A negative result does not preclude SARS-Cov-2 infection and should not be used as the sole basis for treatment or other patient management decisions. A negative result may occur with  improper specimen collection/handling, submission of specimen other than nasopharyngeal swab, presence of viral mutation(s) within the areas targeted by this assay, and  inadequate number of viral copies(<138 copies/mL). A negative result must be combined with clinical observations, patient history, and epidemiological information. The expected result is Negative.  Fact Sheet for Patients:  BloggerCourse.com  Fact Sheet for Healthcare Providers:  SeriousBroker.it  This test is no t yet approved or cleared by the [place] FDA and  has been authorized for detection and/or diagnosis of SARS-CoV-2 by FDA under an Emergency Use Authorization (EUA). This EUA will remain  in effect (meaning this test can be used) for the duration of the COVID-19 declaration under Section 564(b)(1) of the Act, 21 U.S.C.section 360bbb-3(b)(1), unless the authorization is terminated  or revoked sooner.       Influenza A by PCR POSITIVE (A) NEGATIVE Final   Influenza B by PCR NEGATIVE NEGATIVE Final    Comment: (NOTE) The Xpert Xpress SARS-CoV-2/FLU/RSV plus assay is intended as an aid in the diagnosis of influenza from Nasopharyngeal swab specimens and should not be used as a sole basis for treatment. Nasal washings and aspirates are unacceptable for Xpert Xpress SARS-CoV-2/FLU/RSV testing.  Fact Sheet for Patients: BloggerCourse.com  Fact Sheet for Healthcare Providers: SeriousBroker.it  This test is not yet approved or cleared by the [place] FDA and has been authorized for detection and/or diagnosis of SARS-CoV-2 by FDA under an Emergency Use Authorization (EUA). This EUA will remain in effect (meaning this test can be used) for the duration of the COVID-19 declaration under Section 564(b)(1) of the Act, 21 U.S.C. section 360bbb-3(b)(1), unless the authorization is terminated or revoked.     Resp Syncytial Virus by PCR NEGATIVE NEGATIVE Final    Comment: (NOTE) Fact Sheet for Patients: BloggerCourse.com  Fact Sheet for  Healthcare Providers: SeriousBroker.it  This test is not yet approved or cleared by the [place] FDA and has been authorized for detection and/or diagnosis of SARS-CoV-2 by FDA under an Emergency Use Authorization (EUA). This EUA will remain in effect (meaning this test can be used) for the duration of the COVID-19 declaration under Section 564(b)(1) of the Act, 21 U.S.C. section 360bbb-3(b)(1), unless the authorization is terminated or revoked.  Performed at [facility], [address], [place], [place] [address]   Urine Culture     Status: Abnormal   Collection Time: 05/02/23  6:05 PM   Specimen: Urine, Clean Catch  Result Value Ref Range Status   Specimen Description   Final    URINE, CLEAN CATCH  Performed at [facility], [address] [place], [place] [address]    Special Requests   Final    NONE Performed at [facility], [address], [place], [place] [address]    Culture MULTIPLE SPECIES PRESENT, SUGGEST RECOLLECTION (A)  Final   Report Status 05/03/2023 FINAL  Final  Blood culture (routine x 2)     Status: None (Preliminary result)   Collection Time: 05/02/23  8:30 PM   Specimen: BLOOD RIGHT ARM  Result Value Ref Range Status   Specimen Description   Final    BLOOD RIGHT ARM Performed at [facility], [address], [place], [place] [address]    Special Requests   Final    BOTTLES DRAWN AEROBIC AND ANAEROBIC Blood Culture adequate volume Performed at [facility], [address] [place], [place] [address]    Culture   Final    NO GROWTH 3 DAYS Performed at [facility], [address], [place], [place] [address]    Report Status PENDING  Incomplete  Blood culture (routine x 2)     Status: None (Preliminary result)   Collection Time: 05/02/23  8:39 PM   Specimen: BLOOD RIGHT ARM  Result Value Ref Range Status   Specimen Description    Final    BLOOD RIGHT ARM Performed at [facility], [address], [place], [place] [address]    Special Requests   Final    BOTTLES DRAWN AEROBIC ONLY Blood Culture adequate volume Performed at [facility], [address] [place], [place] [address]    Culture   Final    NO GROWTH 3 DAYS Performed at [facility], [address], [place], [place] [address]    Report Status PENDING  Incomplete    Antimicrobials: Anti-infectives (From admission, onward)    Start     Dose/Rate Route Frequency Ordered Stop   05/03/23 1000  piperacillin-tazobactam (ZOSYN) IVPB 3.375 g  Status:  Discontinued        3.375 g 12.5 mL/hr over 240 Minutes Intravenous Every 8 hours 05/02/23 2213 05/04/23 1040   05/02/23 2200  oseltamivir (TAMIFLU) capsule 30 mg        30 mg Oral 2 times daily 05/02/23 2131 05/07/23 2159   05/02/23 1800  piperacillin-tazobactam (ZOSYN) IVPB 3.375 g        3.375 g 12.5 mL/hr over 240 Minutes Intravenous  Once 05/02/23 1759 05/03/23 0542      Culture/Microbiology    Component Value Date/Time   SDES  05/02/2023 2039    BLOOD RIGHT ARM Performed at [facility], [address], [place], [place] [address]    SPECREQUEST  05/02/2023 2039    BOTTLES DRAWN AEROBIC ONLY Blood Culture adequate volume Performed at [facility], [address] [place], [place] [address]    CULT  05/02/2023 2039    NO GROWTH 3 DAYS Performed at [facility], [address] [place], [place] [address]    REPTSTATUS PENDING 05/02/2023 2039    Radiology Studies: DG Abd Portable 1V  Result Date: 05/06/2023 CLINICAL DATA:  Small-bowel obstruction EXAM: PORTABLE ABDOMEN - 1 VIEW COMPARISON:  05/05/2023 FINDINGS: Gastric tube remains in decompressed stomach, probably across the pylorus into the duodenal bulb. Gas dilated left abdominal small bowel loops up to 4.6 cm diameter, appearing similar in number and degree of dilatation.  Contrast material noted in decompressed colon. Cholecystectomy clips.  No definite abnormal abdominal calcifications. Regional bones unremarkable. IMPRESSION: 1. Stable appearance of presumed mid/distal small bowel obstruction Electronically Signed   By: Corlis Leak M.D.   On: 05/06/2023 09:33   DG Abd Portable 1V  Result Date: 05/05/2023 CLINICAL DATA:  [id number] with small bowel obstruction.  Follow-up. EXAM: PORTABLE ABDOMEN - 1 VIEW COMPARISON:  Portable study yesterday at 3:23 p.m. FINDINGS: 5:06 a.m. NGT tip is in the right upper quadrant either in the most distal gastric antrum or in the duodenal bulb. It is probably in the distal antrum based on the CT of 05/02/2023. There is persistent dilatation of small bowel, maximum caliber in the left lower quadrant 4.5 cm, previously 5.1 cm, mildly improved. There is scattered gas and stool in the large bowel at least into the distal sigmoid segment. There is increased faint contrast opacification in the ascending and transverse colon. There is no supine evidence of free air. Right upper quadrant surgical clips. IMPRESSION: 1. NGT tip is in the right upper quadrant either in the most distal gastric antrum or in the duodenal bulb. 2. Persistent dilatation of small bowel, maximum caliber in the left lower quadrant 4.5 cm, previously 5.1 cm. 3. Increased faint contrast opacification in the ascending and transverse colon. 4. No other change. Electronically Signed   By: Almira Bar M.D.   On: 05/05/2023 06:41   DG Abd Portable 1V  Result Date: 05/04/2023 CLINICAL DATA:  Small-bowel obstruction EXAM: PORTABLE ABDOMEN - 1 VIEW COMPARISON:  05/03/2023, 05/02/2023 FINDINGS: Enteric tube remains in place. Persistently dilated loops of small bowel, predominantly within the left hemiabdomen. Progressive transit of contrast through the small bowel. Contrast has become more dilute compared to the previous study. Contrast has not definitively reached the large bowel, based on  position of the cecum in the previous CT. IMPRESSION: Persistently dilated loops of small bowel, predominantly within the left hemiabdomen. Progressive transit of contrast through the small bowel. Contrast has not definitively reached the large bowel. Electronically Signed   By: Duanne Guess D.O.   On: 05/04/2023 19:24     LOS: 4 days   Lanae Boast, MD [facility]  05/06/2023, 11:13 AM

## 2023-05-06 NOTE — Progress Notes (Signed)
Subjective: CC: Her daughter is at bedside. Patient denies any abdominal pain currently. Still having nausea and feels bloated. No flatus or bm.   Xray this am with contrast in colon but continued dilated small bowel loops  Objective: Vital signs in last 24 hours: Temp:  [97.6 F (36.4 C)-98.5 F (36.9 C)] 98.4 F (36.9 C) (09/18 0534) Pulse Rate:  [79-90] 79 (09/18 0626) Resp:  [16-18] 18 (09/18 0534) BP: (161-179)/(81-91) 161/82 (09/18 0626) SpO2:  [91 %-96 %] 96 % (09/18 0534) Last BM Date : 04/30/23  Intake/Output from previous day: 09/17 0701 - 09/18 0700 In: 1777.6 [I.V.:1777.6] Out: 800 [Urine:600; Emesis/NG output:200] Intake/Output this shift: Total I/O In: -  Out: 300 [Emesis/NG output:300]  PE: Gen:  Alert, NAD, pleasant Abd: Soft, moderate distension, upper and left sided abdominal ttp without rigidity or guarding. NGT with bilious output in cannister. Prior midline scar well healed    Lab Results:  Recent Labs    05/05/23 0457 05/06/23 0500  WBC 6.8 6.1  HGB 11.4* 11.8*  HCT 36.9 38.3  PLT 91* 96*   BMET Recent Labs    05/04/23 0444 05/06/23 0500  NA 140 142  K 3.7 3.1*  CL 104 106  CO2 28 24  GLUCOSE 133* 85  BUN 41* 24*  CREATININE 1.00 0.69  CALCIUM 8.5* 8.4*   PT/INR No results for input(s): "LABPROT", "INR" in the last 72 hours. CMP     Component Value Date/Time   NA 142 05/06/2023 0500   K 3.1 (L) 05/06/2023 0500   CL 106 05/06/2023 0500   CO2 24 05/06/2023 0500   GLUCOSE 85 05/06/2023 0500   BUN 24 (H) 05/06/2023 0500   CREATININE 0.69 05/06/2023 0500   CALCIUM 8.4 (L) 05/06/2023 0500   PROT 5.5 (L) 05/06/2023 0500   ALBUMIN 3.0 (L) 05/06/2023 0500   AST 30 05/06/2023 0500   ALT 42 05/06/2023 0500   ALKPHOS 36 (L) 05/06/2023 0500   BILITOT 1.2 05/06/2023 0500   GFRNONAA >60 05/06/2023 0500   Lipase     Component Value Date/Time   LIPASE <10 (L) 05/02/2023 1621    Studies/Results: DG Abd Portable  1V  Result Date: 05/06/2023 CLINICAL DATA:  Small-bowel obstruction EXAM: PORTABLE ABDOMEN - 1 VIEW COMPARISON:  05/05/2023 FINDINGS: Gastric tube remains in decompressed stomach, probably across the pylorus into the duodenal bulb. Gas dilated left abdominal small bowel loops up to 4.6 cm diameter, appearing similar in number and degree of dilatation. Contrast material noted in decompressed colon. Cholecystectomy clips. No definite abnormal abdominal calcifications. Regional bones unremarkable. IMPRESSION: 1. Stable appearance of presumed mid/distal small bowel obstruction Electronically Signed   By: Corlis Leak M.D.   On: 05/06/2023 09:33   DG Abd Portable 1V  Result Date: 05/05/2023 CLINICAL DATA:  [id number] with small bowel obstruction.  Follow-up. EXAM: PORTABLE ABDOMEN - 1 VIEW COMPARISON:  Portable study yesterday at 3:23 p.m. FINDINGS: 5:06 a.m. NGT tip is in the right upper quadrant either in the most distal gastric antrum or in the duodenal bulb. It is probably in the distal antrum based on the CT of 05/02/2023. There is persistent dilatation of small bowel, maximum caliber in the left lower quadrant 4.5 cm, previously 5.1 cm, mildly improved. There is scattered gas and stool in the large bowel at least into the distal sigmoid segment. There is increased faint contrast opacification in the ascending and transverse colon. There is no supine evidence of free air.  Right upper quadrant surgical clips. IMPRESSION: 1. NGT tip is in the right upper quadrant either in the most distal gastric antrum or in the duodenal bulb. 2. Persistent dilatation of small bowel, maximum caliber in the left lower quadrant 4.5 cm, previously 5.1 cm. 3. Increased faint contrast opacification in the ascending and transverse colon. 4. No other change. Electronically Signed   By: Almira Bar M.D.   On: 05/05/2023 06:41   DG Abd Portable 1V  Result Date: 05/04/2023 CLINICAL DATA:  Small-bowel obstruction EXAM: PORTABLE ABDOMEN -  1 VIEW COMPARISON:  05/03/2023, 05/02/2023 FINDINGS: Enteric tube remains in place. Persistently dilated loops of small bowel, predominantly within the left hemiabdomen. Progressive transit of contrast through the small bowel. Contrast has become more dilute compared to the previous study. Contrast has not definitively reached the large bowel, based on position of the cecum in the previous CT. IMPRESSION: Persistently dilated loops of small bowel, predominantly within the left hemiabdomen. Progressive transit of contrast through the small bowel. Contrast has not definitively reached the large bowel. Electronically Signed   By: Duanne Guess D.O.   On: 05/04/2023 19:24    Anti-infectives: Anti-infectives (From admission, onward)    Start     Dose/Rate Route Frequency Ordered Stop   05/03/23 1000  piperacillin-tazobactam (ZOSYN) IVPB 3.375 g  Status:  Discontinued        3.375 g 12.5 mL/hr over 240 Minutes Intravenous Every 8 hours 05/02/23 2213 05/04/23 1040   05/02/23 2200  oseltamivir (TAMIFLU) capsule 30 mg        30 mg Oral 2 times daily 05/02/23 2131 05/07/23 2159   05/02/23 1800  piperacillin-tazobactam (ZOSYN) IVPB 3.375 g        3.375 g 12.5 mL/hr over 240 Minutes Intravenous  Once 05/02/23 1759 05/03/23 0542        Assessment/Plan SBO - CT w/ small bowel obstruction with transition in the right mid/lower abdomen. There is also enhancement/inflammation along the medial cecum. Patient denies hx of appendectomy. As stated in Dr. Michaell Cowing note yesterday, there is concern about a mass in her cecum that may be causing partial obstruction versus this being a separate finding. [facility] has been consulted for Colonoscopy - they would be agreeable to colonoscopy if SBO resolves. CEA wnl.  - HDS without fever, tachycardia or hypotension. No peritonitis on exam. WBC 6.1. No current indication for emergency surgery today - Patient does have contrast in her colon on xray today but small bowel is also  still dilated on xray + she is distended w/ ttp on exam, denies return of bowel function and is nauseated. Leave NPO, NGT to LIWS today and repeat Xray in the AM.  - Keep K > 4, Mg > 2 and mobilize as able for bowel function - Will observe for another 24 hours. If patient is improving tomorrow we can consider clamping NGT and allowing clears. If she was to improve with conservative management would recommend colonoscopy by GI while inpatient. If patient is not improving tomorrow would recommend exploratory laparotomy - discussed this plan with my attending who agrees.  - We will follow with you.  FEN - NPO, NGT to LIWS, IVF per primary  VTE - SCDs, okay for chemical ppx from our standpoint ID - None currently.   Influenza A AAA  I reviewed nursing notes, hospitalist notes, last 24 h vitals and pain scores, last 48 h intake and output, last 24 h labs and trends, and last 24 h imaging  results. Discussed with [facility] in person.    LOS: 4 days    Jacinto Halim , [facility] Surgery 05/06/2023, 11:10 AM Please see Amion for pager number during day hours 7:00am-4:30pm

## 2023-05-06 NOTE — Plan of Care (Signed)
  Problem: Education: Goal: Knowledge of General Education information will improve Description Including pain rating scale, medication(s)/side effects and non-pharmacologic comfort measures Outcome: Progressing   Problem: Health Behavior/Discharge Planning: Goal: Ability to manage health-related needs will improve Outcome: Progressing   

## 2023-05-06 NOTE — Progress Notes (Signed)
Physical Therapy Treatment Patient Details Name: Patricia Peters MRN: [id number] DOB: 1936/04/04 Today's Date: 05/06/2023   History of Present Illness Patient is a 87 year old female admitted with SBO. Imaging: appendicitis vs cecal mass per general surgery. Flu (+). Hx of choecystectomy, AAA    PT Comments  Pt agreeable to mobilize with therapy. Reported some fatigue on today after walk. Encouraged her to continue mobilizing as tolerated. Will likely need to maximize home health services (HHPT, HHOT, home health aide) at discharge, depending on continued clinical course.     If plan is discharge home, recommend the following: A little help with walking and/or transfers;A little help with bathing/dressing/bathroom;Help with stairs or ramp for entrance   Can travel by private vehicle        Equipment Recommendations  Rolling walker (2 wheels)    Recommendations for Other Services       Precautions / Restrictions Precautions Precautions: Fall Precaution Comments: NG tube Restrictions Weight Bearing Restrictions: No     Mobility  Bed Mobility Overal bed mobility: Needs Assistance Bed Mobility: Supine to Sit, Sit to Supine     Supine to sit: Supervision, HOB elevated Sit to supine: Supervision, HOB elevated   General bed mobility comments: Supv for lines    Transfers Overall transfer level: Needs assistance Equipment used: None Transfers: Sit to/from Stand Sit to Stand: Contact guard assist           General transfer comment: Increased time.    Ambulation/Gait Ambulation/Gait assistance: Contact guard assist, Min assist Gait Distance (Feet): 350 Feet Assistive device: IV Pole Gait Pattern/deviations: Step-through pattern, Decreased stride length       General Gait Details: Intermittent assist to steady. Tolerated distance well-reported some fatigue after.   Stairs             Wheelchair Mobility     Tilt Bed    Modified Rankin (Stroke Patients  Only)       Balance Overall balance assessment: Needs assistance         Standing balance support: During functional activity Standing balance-Leahy Scale: Fair                              Cognition Arousal: Alert Behavior During Therapy: WFL for tasks assessed/performed Overall Cognitive Status: Within Functional Limits for tasks assessed                                          Exercises      General Comments        Pertinent Vitals/Pain Pain Assessment Pain Assessment: Faces Faces Pain Scale: Hurts a little bit Pain Location: generalized Pain Descriptors / Indicators: Discomfort, Grimacing Pain Intervention(s): Monitored during session    Home Living                          Prior Function            PT Goals (current goals can now be found in the care plan section) Progress towards PT goals: Progressing toward goals    Frequency    Min 1X/week      PT Plan      Co-evaluation              AM-PAC PT "6 Clicks" Mobility   Outcome Measure  Help needed turning from your back to your side while in a flat bed without using bedrails?: None Help needed moving from lying on your back to sitting on the side of a flat bed without using bedrails?: None Help needed moving to and from a bed to a chair (including a wheelchair)?: None Help needed standing up from a chair using your arms (e.g., wheelchair or bedside chair)?: A Little Help needed to walk in hospital room?: A Little Help needed climbing 3-5 steps with a railing? : A Little 6 Click Score: 21    End of Session Equipment Utilized During Treatment: Gait belt Activity Tolerance: Patient tolerated treatment well Patient left: in bed;with call bell/phone within reach;with bed alarm set;with family/visitor present   PT Visit Diagnosis: Muscle weakness (generalized) (M62.81);Difficulty in walking, not elsewhere classified (R26.2)     Time:  7846-9629 PT Time Calculation (min) (ACUTE ONLY): 23 min  Charges:    $Gait Training: 23-37 mins PT General Charges $$ ACUTE PT VISIT: 1 Visit                         Faye Ramsay, PT Acute Rehabilitation  Office: [phone]

## 2023-05-06 NOTE — Progress Notes (Signed)
Mobility Specialist - Progress Note   05/06/23 1217  Mobility  Activity Ambulated with assistance in hallway  Level of Assistance Standby assist, set-up cues, supervision of patient - no hands on  Assistive Device None  Distance Ambulated (ft) 500 ft  Range of Motion/Exercises Active  Activity Response Tolerated well  Mobility Referral Yes  $Mobility charge 1 Mobility  Mobility Specialist Start Time (ACUTE ONLY) 1205  Mobility Specialist Stop Time (ACUTE ONLY) 1216  Mobility Specialist Time Calculation (min) (ACUTE ONLY) 11 min   Pt received in bed and agreed to mobility, had no issues an returned to bed with all needs met.  Marilynne Halsted Mobility Specialist

## 2023-05-07 ENCOUNTER — Inpatient Hospital Stay (HOSPITAL_COMMUNITY): Payer: Medicare PPO

## 2023-05-07 DIAGNOSIS — K56609 Unspecified intestinal obstruction, unspecified as to partial versus complete obstruction: Secondary | ICD-10-CM | POA: Diagnosis not present

## 2023-05-07 LAB — CBC
HCT: 36.5 % (ref 36.0–46.0)
Hemoglobin: 11.8 g/dL — ABNORMAL LOW (ref 12.0–15.0)
MCH: 30.2 pg (ref 26.0–34.0)
MCHC: 32.3 g/dL (ref 30.0–36.0)
MCV: 93.4 fL (ref 80.0–100.0)
Platelets: 100 10*3/uL — ABNORMAL LOW (ref 150–400)
RBC: 3.91 MIL/uL (ref 3.87–5.11)
RDW: 13.7 % (ref 11.5–15.5)
WBC: 7.3 10*3/uL (ref 4.0–10.5)
nRBC: 0 % (ref 0.0–0.2)

## 2023-05-07 LAB — PROCALCITONIN: Procalcitonin: <0.1 ng/mL

## 2023-05-07 LAB — BASIC METABOLIC PANEL
Anion gap: 14 (ref 5–15)
BUN: 17 mg/dL (ref 8–23)
CO2: 24 mmol/L (ref 22–32)
Calcium: 8.2 mg/dL — ABNORMAL LOW (ref 8.9–10.3)
Chloride: 101 mmol/L (ref 98–111)
Creatinine, Ser: 0.59 mg/dL (ref 0.44–1.00)
GFR, Estimated: >60 mL/min (ref >60)
Glucose, Bld: 75 mg/dL (ref 70–99)
Potassium: 3.1 mmol/L — ABNORMAL LOW (ref 3.5–5.1)
Sodium: 139 mmol/L (ref 135–145)

## 2023-05-07 LAB — SURGICAL PCR SCREEN
MRSA, PCR: NEGATIVE
Staphylococcus aureus: NEGATIVE

## 2023-05-07 LAB — GLUCOSE, CAPILLARY
Glucose-Capillary: 142 mg/dL — ABNORMAL HIGH (ref 70–99)
Glucose-Capillary: 147 mg/dL — ABNORMAL HIGH (ref 70–99)
Glucose-Capillary: 85 mg/dL (ref 70–99)
Glucose-Capillary: 99 mg/dL (ref 70–99)

## 2023-05-07 MED ORDER — POTASSIUM CHLORIDE 10 MEQ/100ML IV SOLN
10.0000 meq | INTRAVENOUS | Status: AC
  Administered 2023-05-07 (×4): 10 meq via INTRAVENOUS
  Filled 2023-05-07 (×3): qty 100

## 2023-05-07 MED ORDER — KCL IN DEXTROSE-NACL 20-5-0.9 MEQ/L-%-% IV SOLN
INTRAVENOUS | Status: DC
  Filled 2023-05-07 (×3): qty 1000

## 2023-05-07 MED ORDER — POTASSIUM CHLORIDE 10 MEQ/100ML IV SOLN
INTRAVENOUS | Status: AC
  Filled 2023-05-07: qty 100

## 2023-05-07 MED ORDER — LIDOCAINE 5 % EX PTCH
1.0000 | MEDICATED_PATCH | CUTANEOUS | Status: DC
  Administered 2023-05-07 – 2023-05-14 (×7): 1 via TRANSDERMAL
  Filled 2023-05-07 (×9): qty 1

## 2023-05-07 NOTE — Care Management Important Message (Signed)
Important Message  Patient Details IM Letter given. Name: Patricia Peters MRN: [id number] Date of Birth: 07-Oct-1935   Medicare Important Message Given:  Yes     [name] 05/07/2023, 9:52 AM

## 2023-05-07 NOTE — Progress Notes (Signed)
Subjective: CC: Her daughter is at bedside. Patient denies any abdominal pain currently. Still having intermittent nausea. No flatus or bm.   Xray pending this am.   Objective: Vital signs in last 24 hours: Temp:  [97.5 F (36.4 C)-98.5 F (36.9 C)] 97.5 F (36.4 C) (09/19 0642) Pulse Rate:  [85-92] 87 (09/19 0642) Resp:  [18] 18 (09/19 0642) BP: (157-167)/(74-87) 157/86 (09/19 0642) SpO2:  [92 %-94 %] 92 % (09/19 0642) Last BM Date : 04/30/23  Intake/Output from previous day: 09/18 0701 - 09/19 0700 In: 2105.9 [I.V.:1705.9; IV Piggyback:400] Out: 1950 [Urine:1000; Emesis/NG output:950] Intake/Output this shift: No intake/output data recorded.  PE: Gen:  Alert, NAD, pleasant Abd: Soft, mild to moderate distension, she denies ttp today but reports "pressure" with palpation to her upper abdomen. No rigidity or guarding. NGT with dark thick output in cannister. Prior midline scar well healed    Lab Results:  Recent Labs    05/06/23 0500 05/07/23 0512  WBC 6.1 7.3  HGB 11.8* 11.8*  HCT 38.3 36.5  PLT 96* 100*   BMET Recent Labs    05/06/23 0500 05/07/23 0512  NA 142 139  K 3.1* 3.1*  CL 106 101  CO2 24 24  GLUCOSE 85 75  BUN 24* 17  CREATININE 0.69 0.59  CALCIUM 8.4* 8.2*   PT/INR No results for input(s): "LABPROT", "INR" in the last 72 hours. CMP     Component Value Date/Time   NA 139 05/07/2023 0512   K 3.1 (L) 05/07/2023 0512   CL 101 05/07/2023 0512   CO2 24 05/07/2023 0512   GLUCOSE 75 05/07/2023 0512   BUN 17 05/07/2023 0512   CREATININE 0.59 05/07/2023 0512   CALCIUM 8.2 (L) 05/07/2023 0512   PROT 5.5 (L) 05/06/2023 0500   ALBUMIN 3.0 (L) 05/06/2023 0500   AST 30 05/06/2023 0500   ALT 42 05/06/2023 0500   ALKPHOS 36 (L) 05/06/2023 0500   BILITOT 1.2 05/06/2023 0500   GFRNONAA >60 05/07/2023 0512   Lipase     Component Value Date/Time   LIPASE <10 (L) 05/02/2023 1621    Studies/Results: DG Abd Portable 1V  Result Date:  05/06/2023 CLINICAL DATA:  Small-bowel obstruction EXAM: PORTABLE ABDOMEN - 1 VIEW COMPARISON:  05/05/2023 FINDINGS: Gastric tube remains in decompressed stomach, probably across the pylorus into the duodenal bulb. Gas dilated left abdominal small bowel loops up to 4.6 cm diameter, appearing similar in number and degree of dilatation. Contrast material noted in decompressed colon. Cholecystectomy clips. No definite abnormal abdominal calcifications. Regional bones unremarkable. IMPRESSION: 1. Stable appearance of presumed mid/distal small bowel obstruction Electronically Signed   By: Corlis Leak M.D.   On: 05/06/2023 09:33    Anti-infectives: Anti-infectives (From admission, onward)    Start     Dose/Rate Route Frequency Ordered Stop   05/03/23 1000  piperacillin-tazobactam (ZOSYN) IVPB 3.375 g  Status:  Discontinued        3.375 g 12.5 mL/hr over 240 Minutes Intravenous Every 8 hours 05/02/23 2213 05/04/23 1040   05/02/23 2200  oseltamivir (TAMIFLU) capsule 30 mg        30 mg Oral 2 times daily 05/02/23 2131 05/07/23 2159   05/02/23 1800  piperacillin-tazobactam (ZOSYN) IVPB 3.375 g        3.375 g 12.5 mL/hr over 240 Minutes Intravenous  Once 05/02/23 1759 05/03/23 0542        Assessment/Plan SBO - CT w/ small bowel obstruction with transition  in the right mid/lower abdomen. There is also enhancement/inflammation along the medial cecum. Patient denies hx of appendectomy. As stated in Dr. Michaell Cowing note yesterday, there is concern about a mass in her cecum that may be causing partial obstruction versus this being a separate finding. [facility] has been consulted for Colonoscopy - they would be agreeable to colonoscopy if SBO resolves. CEA wnl.  - Xray overall stable. She still does not have any reutrn of bowel function. Discussed with MD. Recommend OR in the AM for exploratory laparotomy, possible SBR, possible colectomy, possible creation of ostomy. The planned procedure and material risks were  discussed with the patient. Risks include but are not limited to anesthesia (MI, CVA, prolonged intubation, aspiration, death), pain, bleeding,  infection, scarring, hernia, damage to surrounding structures (blood vessels/nerves/viscus/organs/ureter), ileus, post operative abscess, leak from anastomosis, DVT/PE and possible need for stoma/ileostomy. We also discussed typical post-operative care including the possible need for rehab/snf if necessary. The patient and her daughters questions were answered to their satisfaction, they voiced understanding and elected to proceed with surgery. Discussed with TRH that planning OR in AM so they can optimize for surgery.   FEN - NPO, NGT to LIWS, IVF per primary  VTE - SCDs, okay for chemical ppx from our standpoint ID - None currently.   Influenza A AAA  I reviewed nursing notes, hospitalist notes, last 24 h vitals and pain scores, last 48 h intake and output, last 24 h labs and trends, and last 24 h imaging results. Discussed with TRH in person.    LOS: 5 days    Jacinto Halim , [facility] 05/07/2023, 8:38 AM Please see Amion for pager number during day hours 7:00am-4:30pm

## 2023-05-07 NOTE — H&P (View-Only) (Signed)
Subjective: CC: Her daughter is at bedside. Patient denies any abdominal pain currently. Still having intermittent nausea. No flatus or bm.   Xray pending this am.   Objective: Vital signs in last 24 hours: Temp:  [97.5 F (36.4 C)-98.5 F (36.9 C)] 97.5 F (36.4 C) (09/19 0642) Pulse Rate:  [85-92] 87 (09/19 0642) Resp:  [18] 18 (09/19 0642) BP: (157-167)/(74-87) 157/86 (09/19 0642) SpO2:  [92 %-94 %] 92 % (09/19 0642) Last BM Date : 04/30/23  Intake/Output from previous day: 09/18 0701 - 09/19 0700 In: 2105.9 [I.V.:1705.9; IV Piggyback:400] Out: 1950 [Urine:1000; Emesis/NG output:950] Intake/Output this shift: No intake/output data recorded.  PE: Gen:  Alert, NAD, pleasant Abd: Soft, mild to moderate distension, she denies ttp today but reports "pressure" with palpation to her upper abdomen. No rigidity or guarding. NGT with dark thick output in cannister. Prior midline scar well healed    Lab Results:  Recent Labs    05/06/23 0500 05/07/23 0512  WBC 6.1 7.3  HGB 11.8* 11.8*  HCT 38.3 36.5  PLT 96* 100*   BMET Recent Labs    05/06/23 0500 05/07/23 0512  NA 142 139  K 3.1* 3.1*  CL 106 101  CO2 24 24  GLUCOSE 85 75  BUN 24* 17  CREATININE 0.69 0.59  CALCIUM 8.4* 8.2*   PT/INR No results for input(s): "LABPROT", "INR" in the last 72 hours. CMP     Component Value Date/Time   NA 139 05/07/2023 0512   K 3.1 (L) 05/07/2023 0512   CL 101 05/07/2023 0512   CO2 24 05/07/2023 0512   GLUCOSE 75 05/07/2023 0512   BUN 17 05/07/2023 0512   CREATININE 0.59 05/07/2023 0512   CALCIUM 8.2 (L) 05/07/2023 0512   PROT 5.5 (L) 05/06/2023 0500   ALBUMIN 3.0 (L) 05/06/2023 0500   AST 30 05/06/2023 0500   ALT 42 05/06/2023 0500   ALKPHOS 36 (L) 05/06/2023 0500   BILITOT 1.2 05/06/2023 0500   GFRNONAA >60 05/07/2023 0512   Lipase     Component Value Date/Time   LIPASE <10 (L) 05/02/2023 1621    Studies/Results: DG Abd Portable 1V  Result Date:  05/06/2023 CLINICAL DATA:  Small-bowel obstruction EXAM: PORTABLE ABDOMEN - 1 VIEW COMPARISON:  05/05/2023 FINDINGS: Gastric tube remains in decompressed stomach, probably across the pylorus into the duodenal bulb. Gas dilated left abdominal small bowel loops up to 4.6 cm diameter, appearing similar in number and degree of dilatation. Contrast material noted in decompressed colon. Cholecystectomy clips. No definite abnormal abdominal calcifications. Regional bones unremarkable. IMPRESSION: 1. Stable appearance of presumed mid/distal small bowel obstruction Electronically Signed   By: Corlis Leak M.D.   On: 05/06/2023 09:33    Anti-infectives: Anti-infectives (From admission, onward)    Start     Dose/Rate Route Frequency Ordered Stop   05/03/23 1000  piperacillin-tazobactam (ZOSYN) IVPB 3.375 g  Status:  Discontinued        3.375 g 12.5 mL/hr over 240 Minutes Intravenous Every 8 hours 05/02/23 2213 05/04/23 1040   05/02/23 2200  oseltamivir (TAMIFLU) capsule 30 mg        30 mg Oral 2 times daily 05/02/23 2131 05/07/23 2159   05/02/23 1800  piperacillin-tazobactam (ZOSYN) IVPB 3.375 g        3.375 g 12.5 mL/hr over 240 Minutes Intravenous  Once 05/02/23 1759 05/03/23 0542        Assessment/Plan SBO - CT w/ small bowel obstruction with transition  in the right mid/lower abdomen. There is also enhancement/inflammation along the medial cecum. Patient denies hx of appendectomy. As stated in Dr. Michaell Cowing note yesterday, there is concern about a mass in her cecum that may be causing partial obstruction versus this being a separate finding. [facility] has been consulted for Colonoscopy - they would be agreeable to colonoscopy if SBO resolves. CEA wnl.  - Xray overall stable. She still does not have any reutrn of bowel function. Discussed with MD. Recommend OR in the AM for exploratory laparotomy, possible SBR, possible colectomy, possible creation of ostomy. The planned procedure and material risks were  discussed with the patient. Risks include but are not limited to anesthesia (MI, CVA, prolonged intubation, aspiration, death), pain, bleeding,  infection, scarring, hernia, damage to surrounding structures (blood vessels/nerves/viscus/organs/ureter), ileus, post operative abscess, leak from anastomosis, DVT/PE and possible need for stoma/ileostomy. We also discussed typical post-operative care including the possible need for rehab/snf if necessary. The patient and her daughters questions were answered to their satisfaction, they voiced understanding and elected to proceed with surgery. Discussed with TRH that planning OR in AM so they can optimize for surgery.   FEN - NPO, NGT to LIWS, IVF per primary  VTE - SCDs, okay for chemical ppx from our standpoint ID - None currently.   Influenza A AAA  I reviewed nursing notes, hospitalist notes, last 24 h vitals and pain scores, last 48 h intake and output, last 24 h labs and trends, and last 24 h imaging results. Discussed with TRH in person.    LOS: 5 days    Jacinto Halim , [facility] 05/07/2023, 8:38 AM Please see Amion for pager number during day hours 7:00am-4:30pm

## 2023-05-07 NOTE — Progress Notes (Signed)
PROGRESS NOTE DOMINICA MANCIA  YQM:[id number] DOB: 1936-07-18 DOA: 05/02/2023 PCP: Ollen Bowl, MD  Brief Narrative/Hospital Course: NORMANDIE BLANKS is a 87 y.o. female with a history of cholecystectomy.  Patient presented secondary to extreme fatigue, upper abdominal pain, nausea, vomiting and diarrhea. Patient was found to have evidence of influenza A infection in addition to SBO with transition point. General surgery consulted with recommendation for conservative management. NG tube placed. Patient started on oseltamivir for influenza A treatment. Patient followed by general surgery    Subjective: Patient seen and examined this morning Complains of dry cough about the same No fever chills, daughter at the bedside Overnight afebrile BP stable,  Labs shows potassium still low at 3.1 platelet slightly trending up 100 K  NG output> 950cc, last BM 9/12,Still BM or flatus X-ray abdomen pending this morning  Assessment and Plan: Principal Problem:   SBO (small bowel obstruction) (HCC) Active Problems:   Anxiety   Influenza due to identified novel influenza A virus with other respiratory manifestations   Infrarenal abdominal aortic aneurysm (AAA) without rupture (HCC)   Hyperglycemia   Dehydration   Hypoxia   Thrombocytopenia (HCC)   CKD (chronic kidney disease) stage 2, GFR 60-89 ml/min   Memory changes  SBO: Patient had CT scan w/transition point in right mid/lower abdomen, CCS following on conservative management with NG tube decompression, on IV antibiotics,IVF,NPO and SBO protocol per surgery- does have contrast on colon on x-ray 9/17 but still with small bowel dilatation and symptomatic with pain and nausea> x-ray abdomen pending this morning, if fails to improve may require exploratory laparotomy    Enhancement/inflammation along the medial cecum without discrete/visualize appendix Small to moderate abdominopelvic ascites: If patient had a prior appendicectomy appearance it  would be super cecal mass/cancer, acute appendicitis is also possible. [facility], Dr. Dulce Sellar, consulted for consideration of inpatient colonoscopy; called on 9/15 with agreement that inpatient colonoscopy is warranted but to re-consult once SBO resolved   Influenza A infection: Mild symptoms with cough currently no fever., not hypoxic.  Continue antitussives.  Completing oseltamivir today.  Due to persistent cough chest x-ray ordered. Her symptoms started last Thursday.Cont sch and prn bronchodilators/nebs.   Abnormal urinalysis-negative urine culture. Likely asymptomatic bacteruria.No treatment needed.   Thrombocytopenia: Slowly trending up- holding around 100k, likely in the setting of influenza infection/acute illness. Monitor Recent Labs  Lab 05/03/23 0430 05/04/23 0444 05/05/23 0457 05/06/23 0500 05/07/23 0512  PLT 110* 95* 91* 96* 100*     Hypokalemia: Replacin w/ iv kcl and changing to d5ns+ kcl ivf Recent Labs  Lab 05/02/23 1621 05/03/23 0430 05/04/23 0444 05/06/23 0500 05/07/23 0512  K 4.4 4.2 3.7 3.1* 3.1*  CALCIUM 9.1 8.5* 8.5* 8.4* 8.2*  MG  --   --   --  2.0  --    Hyperglycemia Prediabetes HbA1c 6.4: Stable cbg, need to see pcp on discharge  Dehydration Secondary to nausea/vomiting/SBO.Keep on ivf   Bilateral pleural effusions mild: Incidentally noted on CT scan, Monitor not hypoxic   Abdominal aortic aneurysm Incidental finding. Aneurysm is infrarenal and measures 2.8 cm. Recommendation for outpatient vascular surgery follow-up.   DVT prophylaxis: Place and maintain sequential compression device Start: 05/02/23 2129 Code Status:   Code Status: Full Code Family Communication: plan of care discussed with patient/ her daughter at bedside. Patient status is: Inpatient because of SBO Level of care: Med-Surg   Dispo: The patient is from: home  Anticipated disposition: TBD Objective: Vitals last 24 hrs: Vitals:   05/06/23 0626 05/06/23 1317  05/06/23 2001 05/07/23 0642  BP: (!) 161/82 (!) 167/74 (!) 158/87 (!) 157/86  Pulse: 79 85 92 87  Resp:  18 18 18   Temp:  98.5 F (36.9 C) 98.4 F (36.9 C) (!) 97.5 F (36.4 C)  TempSrc:  Oral Oral Oral  SpO2:  94% 93% 92%  Weight:      Height:       Weight change:   Physical Examination: General exam: alert awake, oriented, ill appearing,NTGT+ HEENT:Oral mucosa moist, Ear/Nose WNL grossly Respiratory system: Bilaterally air entry present breath sounds much clearer today mild wheezing on posterior base Cardiovascular system: S1 & S2 +, No JVD. Gastrointestinal system: Abdomen soft,NT some distention present bowel sounds absent Nervous System: Alert, awake, moving all extremities,and following commands. Extremities: LE edema neg,distal peripheral pulses palpable and warm.  Skin: No rashes,no icterus. MSK: Normal muscle bulk,tone, power   Medications reviewed:  Scheduled Meds:  bisacodyl  10 mg Rectal Daily   pantoprazole  40 mg Oral Daily  Continuous Infusions:  dextrose 5 % and 0.9 % NaCl with KCl 20 mEq/L 75 mL/hr at 05/07/23 0838   methocarbamol (ROBAXIN) IV     ondansetron (ZOFRAN) IV     potassium chloride 10 mEq (05/07/23 1025)    Diet Order             Diet NPO time specified Except for: Ice Chips  Diet effective now                    Intake/Output Summary (Last 24 hours) at 05/07/2023 1113 Last data filed at 05/07/2023 1000 Gross per 24 hour  Intake 1759.31 ml  Output 1750 ml  Net 9.31 ml   Net IO Since Admission: 3,288.97 mL [05/07/23 1113]  Wt Readings from Last 3 Encounters:  05/02/23 68.9 kg  04/06/17 82.6 kg  03/28/17 82.1 kg     Unresulted Labs (From admission, onward)     Start     Ordered   05/07/23 1035  Procalcitonin  Add-on,   AD       References:    Procalcitonin Lower Respiratory Tract Infection AND Sepsis Procalcitonin Algorithm  Question:  Specimen collection method  Answer:  Lab=Lab collect   05/07/23 1034   05/07/23 0500   Basic metabolic panel  Daily,   R     Question:  Specimen collection method  Answer:  Lab=Lab collect   05/06/23 1115   05/07/23 0500  CBC  Daily,   R     Question:  Specimen collection method  Answer:  Lab=Lab collect   05/06/23 1115          Data Reviewed: I have personally reviewed following labs and imaging studies CBC: Recent Labs  Lab 05/03/23 0430 05/04/23 0444 05/05/23 0457 05/06/23 0500 05/07/23 0512  WBC 8.8 8.8 6.8 6.1 7.3  HGB 12.7 12.9 11.4* 11.8* 11.8*  HCT 39.0 40.5 36.9 38.3 36.5  MCV 92.6 93.3 96.9 95.5 93.4  PLT 110* 95* 91* 96* 100*   Basic Metabolic Panel: Recent Labs  Lab 05/02/23 1621 05/03/23 0430 05/04/23 0444 05/06/23 0500 05/07/23 0512  NA 137 135 140 142 139  K 4.4 4.2 3.7 3.1* 3.1*  CL 99 102 104 106 101  CO2 26 25 28 24 24   GLUCOSE 169* 175* 133* 85 75  BUN 45* 48* 41* 24* 17  CREATININE 1.17* 0.99 1.00 0.69 0.59  CALCIUM 9.1 8.5* 8.5* 8.4* 8.2*  MG  --   --   --  2.0  --    GFR: Estimated Creatinine Clearance: 48.1 mL/min (by C-G formula based on SCr of 0.59 mg/dL). Liver Function Tests: Recent Labs  Lab 05/02/23 1621 05/06/23 0500  AST 26 30  ALT 17 42  ALKPHOS 38 36*  BILITOT 0.7 1.2  PROT 6.6 5.5*  ALBUMIN 3.9 3.0*   Recent Labs  Lab 05/02/23 1621  LIPASE <10*  CBG: Recent Labs  Lab 05/06/23 0530 05/06/23 1134 05/06/23 1753 05/06/23 2349 05/07/23 0639  GLUCAP 79 76 86 89 85   Recent Labs  Lab 05/02/23 1742 05/02/23 2030  LATICACIDVEN 1.3 1.5    Recent Results (from the past 240 hour(s))  Resp panel by RT-PCR (RSV, Flu A&B, Covid) Anterior Nasal Swab     Status: Abnormal   Collection Time: 05/02/23  3:48 PM   Specimen: Anterior Nasal Swab  Result Value Ref Range Status   SARS Coronavirus 2 by RT PCR NEGATIVE NEGATIVE Final    Comment: (NOTE) SARS-CoV-2 target nucleic acids are NOT DETECTED.  The SARS-CoV-2 RNA is generally detectable in upper respiratory specimens during the acute phase of  infection. The lowest concentration of SARS-CoV-2 viral copies this assay can detect is 138 copies/mL. A negative result does not preclude SARS-Cov-2 infection and should not be used as the sole basis for treatment or other patient management decisions. A negative result may occur with  improper specimen collection/handling, submission of specimen other than nasopharyngeal swab, presence of viral mutation(s) within the areas targeted by this assay, and inadequate number of viral copies(<138 copies/mL). A negative result must be combined with clinical observations, patient history, and epidemiological information. The expected result is Negative.  Fact Sheet for Patients:  BloggerCourse.com  Fact Sheet for Healthcare Providers:  SeriousBroker.it  This test is no t yet approved or cleared by the [place] FDA and  has been authorized for detection and/or diagnosis of SARS-CoV-2 by FDA under an Emergency Use Authorization (EUA). This EUA will remain  in effect (meaning this test can be used) for the duration of the COVID-19 declaration under Section 564(b)(1) of the Act, 21 U.S.C.section 360bbb-3(b)(1), unless the authorization is terminated  or revoked sooner.       Influenza A by PCR POSITIVE (A) NEGATIVE Final   Influenza B by PCR NEGATIVE NEGATIVE Final    Comment: (NOTE) The Xpert Xpress SARS-CoV-2/FLU/RSV plus assay is intended as an aid in the diagnosis of influenza from Nasopharyngeal swab specimens and should not be used as a sole basis for treatment. Nasal washings and aspirates are unacceptable for Xpert Xpress SARS-CoV-2/FLU/RSV testing.  Fact Sheet for Patients: BloggerCourse.com  Fact Sheet for Healthcare Providers: SeriousBroker.it  This test is not yet approved or cleared by the [place] FDA and has been authorized for detection and/or diagnosis of  SARS-CoV-2 by FDA under an Emergency Use Authorization (EUA). This EUA will remain in effect (meaning this test can be used) for the duration of the COVID-19 declaration under Section 564(b)(1) of the Act, 21 U.S.C. section 360bbb-3(b)(1), unless the authorization is terminated or revoked.     Resp Syncytial Virus by PCR NEGATIVE NEGATIVE Final    Comment: (NOTE) Fact Sheet for Patients: BloggerCourse.com  Fact Sheet for Healthcare Providers: SeriousBroker.it  This test is not yet approved or cleared by the [place] FDA and has been authorized for detection and/or diagnosis of SARS-CoV-2 by FDA under an Emergency Use  Authorization (EUA). This EUA will remain in effect (meaning this test can be used) for the duration of the COVID-19 declaration under Section 564(b)(1) of the Act, 21 U.S.C. section 360bbb-3(b)(1), unless the authorization is terminated or revoked.  Performed at [facility], [address], [place], [place] [address]   Urine Culture     Status: Abnormal   Collection Time: 05/02/23  6:05 PM   Specimen: Urine, Clean Catch  Result Value Ref Range Status   Specimen Description   Final    URINE, CLEAN CATCH Performed at [facility], [address], [place], [place] [address]    Special Requests   Final    NONE Performed at [facility], [address], [place], [place] [address]    Culture MULTIPLE SPECIES PRESENT, SUGGEST RECOLLECTION (A)  Final   Report Status 05/03/2023 FINAL  Final  Blood culture (routine x 2)     Status: None (Preliminary result)   Collection Time: 05/02/23  8:30 PM   Specimen: BLOOD RIGHT ARM  Result Value Ref Range Status   Specimen Description   Final    BLOOD RIGHT ARM Performed at [facility], [address] [place], [place] [address]    Special Requests   Final    BOTTLES DRAWN AEROBIC AND ANAEROBIC Blood  Culture adequate volume Performed at [facility], [address] [place], [place] [address]    Culture   Final    NO GROWTH 4 DAYS Performed at [facility], [address] [place], [place] [address]    Report Status PENDING  Incomplete  Blood culture (routine x 2)     Status: None (Preliminary result)   Collection Time: 05/02/23  8:39 PM   Specimen: BLOOD RIGHT ARM  Result Value Ref Range Status   Specimen Description   Final    BLOOD RIGHT ARM Performed at [facility], [address], [place], [place] [address]    Special Requests   Final    BOTTLES DRAWN AEROBIC ONLY Blood Culture adequate volume Performed at [facility], [address] [place], [place] [address]    Culture   Final    NO GROWTH 4 DAYS Performed at [facility], [address] [place], [place] [address]    Report Status PENDING  Incomplete    Antimicrobials: Anti-infectives (From admission, onward)    Start     Dose/Rate Route Frequency Ordered Stop   05/03/23 1000  piperacillin-tazobactam (ZOSYN) IVPB 3.375 g  Status:  Discontinued        3.375 g 12.5 mL/hr over 240 Minutes Intravenous Every 8 hours 05/02/23 2213 05/04/23 1040   05/02/23 2200  oseltamivir (TAMIFLU) capsule 30 mg        30 mg Oral 2 times daily 05/02/23 2131 05/07/23 0845   05/02/23 1800  piperacillin-tazobactam (ZOSYN) IVPB 3.375 g        3.375 g 12.5 mL/hr over 240 Minutes Intravenous  Once 05/02/23 1759 05/03/23 0542      Culture/Microbiology    Component Value Date/Time   SDES  05/02/2023 2039    BLOOD RIGHT ARM Performed at [facility], [address] [place], [place] [address]    SPECREQUEST  05/02/2023 2039    BOTTLES DRAWN AEROBIC ONLY Blood Culture adequate volume Performed at [facility], [address] [place], [place] [address]    CULT  05/02/2023 2039    NO  GROWTH 4 DAYS Performed at [facility], [address],  [place], [place] [address]    REPTSTATUS PENDING 05/02/2023 2039    Radiology Studies: DG Abd Portable 1V  Result Date: 05/06/2023 CLINICAL DATA:  Small-bowel obstruction EXAM: PORTABLE ABDOMEN - 1 VIEW COMPARISON:  05/05/2023 FINDINGS: Gastric tube remains in decompressed stomach, probably across the pylorus into the duodenal bulb. Gas dilated left abdominal small bowel loops up to 4.6 cm diameter, appearing similar in number and degree of dilatation. Contrast material noted in decompressed colon. Cholecystectomy clips. No definite abnormal abdominal calcifications. Regional bones unremarkable. IMPRESSION: 1. Stable appearance of presumed mid/distal small bowel obstruction Electronically Signed   By: Corlis Leak M.D.   On: 05/06/2023 09:33     LOS: 5 days   Lanae Boast, MD [facility]  05/07/2023, 11:13 AM

## 2023-05-07 NOTE — Progress Notes (Signed)
Mobility Specialist - Progress Note   05/07/23 0915  Mobility  Activity Ambulated independently in hallway  Level of Assistance Independent  Assistive Device None  Distance Ambulated (ft) 250 ft  Activity Response Tolerated well  Mobility Referral Yes  $Mobility charge 1 Mobility  Mobility Specialist Start Time (ACUTE ONLY) 0857  Mobility Specialist Stop Time (ACUTE ONLY) 0915  Mobility Specialist Time Calculation (min) (ACUTE ONLY) 18 min   Pt received in bed and agreeable to mobility. After ambulating ~120ft pt c/o nausea & dizziness. Despite complaints, pt still eager to ambulate. No other complaints during session. Pt to recliner after session with all needs met.     [facility]

## 2023-05-07 NOTE — Progress Notes (Signed)
Occupational Therapy Treatment Patient Details Name: Patricia Peters MRN: [id number] DOB: 03-12-1936 Today's Date: 05/07/2023   History of present illness Patient is a 87 year old female admitted with SBO. Imaging: appendicitis vs cecal mass per general surgery. Flu (+). Hx of choecystectomy, AAA   OT comments  The pt was motivated to participate in therapy. She performed tasks at supervision level, including upper body dressing, lower body dressing seated EOB, grooming standing at the sink, and performing a toilet transfer at bathroom level. She is making gradual functional progress. She stated she will be having surgery tomorrow. Continue OT plan of care.       If plan is discharge home, recommend the following:  Assistance with cooking/housework;Assist for transportation;Help with stairs or ramp for entrance   Equipment Recommendations  None recommended by OT    Recommendations for Other Services      Precautions / Restrictions Precautions Precaution Comments: NG tube Restrictions Weight Bearing Restrictions: No       Mobility Bed Mobility Overal bed mobility: Needs Assistance Bed Mobility: Supine to Sit     Supine to sit: Supervision, HOB elevated          Transfers Overall transfer level: Needs assistance Equipment used: None Transfers: Sit to/from Stand Sit to Stand: Supervision                     ADL either performed or assessed with clinical judgement   ADL Overall ADL's : Needs assistance/impaired Eating/Feeding: NPO   Grooming: Supervision/safety;Standing Grooming Details (indicate cue type and reason): She performed teeth brushing in standing at sink level.         Upper Body Dressing : Standing;Minimal assistance Upper Body Dressing Details (indicate cue type and reason): She donned a hospital gown around her back in standing Lower Body Dressing: Supervision/safety Lower Body Dressing Details (indicate cue type and reason): She doffed  then donned her socks seated EOB; she implemented the figure 4 technique to perform. Toilet Transfer: Supervision/safety;Ambulation Toilet Transfer Details (indicate cue type and reason): She ambulated to and from the bathroom in her room without an assistive device.                 Vision Baseline Vision/History: 1 Wears glasses            Cognition Arousal: Alert Behavior During Therapy: WFL for tasks assessed/performed        General Comments: cooperative, friendly, able to follow 1-2 step commands without difficulty                   Pertinent Vitals/ Pain       Pain Assessment Pain Location: discomfort at R UE IV site Pain Intervention(s): Monitored during session         Frequency  Min 1X/week        Progress Toward Goals  OT Goals(current goals can now be found in the care plan section)  Progress towards OT goals: Progressing toward goals  Acute Rehab OT Goals Patient Stated Goal: to get better OT Goal Formulation: With patient Time For Goal Achievement: 05/19/23 Potential to Achieve Goals: Good  Plan         AM-PAC OT "6 Clicks" Daily Activity     Outcome Measure   Help from another person eating meals?: Total (Pt is NPO) Help from another person taking care of personal grooming?: A Little Help from another person toileting, which includes using toliet, bedpan, or urinal?: A Little Help  from another person bathing (including washing, rinsing, drying)?: A Little Help from another person to put on and taking off regular upper body clothing?: A Little Help from another person to put on and taking off regular lower body clothing?: A Little 6 Click Score: 16    End of Session Equipment Utilized During Treatment: Other (comment) (N/A)  OT Visit Diagnosis: Unsteadiness on feet (R26.81);Muscle weakness (generalized) (M62.81)   Activity Tolerance Patient tolerated treatment well   Patient Left in chair;with call bell/phone within reach;with  family/visitor present   Nurse Communication Other (comment) (Nurse cleared pt for therapy participation)        Time: 7829-5621 OT Time Calculation (min): 20 min  Charges: OT General Charges $OT Visit: 1 Visit OT Treatments $Self Care/Home Management : 8-22 mins     Reuben Likes, OTR/L 05/07/2023, 2:11 PM

## 2023-05-08 ENCOUNTER — Inpatient Hospital Stay (HOSPITAL_COMMUNITY): Payer: Medicare PPO | Admitting: Certified Registered Nurse Anesthetist

## 2023-05-08 ENCOUNTER — Encounter (HOSPITAL_COMMUNITY): Payer: Self-pay

## 2023-05-08 ENCOUNTER — Other Ambulatory Visit: Payer: Self-pay

## 2023-05-08 ENCOUNTER — Inpatient Hospital Stay (HOSPITAL_COMMUNITY): Payer: Medicare PPO

## 2023-05-08 ENCOUNTER — Encounter (HOSPITAL_COMMUNITY)
Admission: EM | Disposition: A | Discharge status: Home Health | Payer: Self-pay | Source: Home / Self Care | Attending: Internal Medicine

## 2023-05-08 DIAGNOSIS — K56609 Unspecified intestinal obstruction, unspecified as to partial versus complete obstruction: Secondary | ICD-10-CM

## 2023-05-08 HISTORY — PX: LAPAROTOMY: SHX154

## 2023-05-08 LAB — CBC
HCT: 35 % — ABNORMAL LOW (ref 36.0–46.0)
Hemoglobin: 11.4 g/dL — ABNORMAL LOW (ref 12.0–15.0)
MCH: 29.8 pg (ref 26.0–34.0)
MCHC: 32.6 g/dL (ref 30.0–36.0)
MCV: 91.4 fL (ref 80.0–100.0)
Platelets: 114 10*3/uL — ABNORMAL LOW (ref 150–400)
RBC: 3.83 MIL/uL — ABNORMAL LOW (ref 3.87–5.11)
RDW: 13.6 % (ref 11.5–15.5)
WBC: 7.3 10*3/uL (ref 4.0–10.5)
nRBC: 0 % (ref 0.0–0.2)

## 2023-05-08 LAB — ABO/RH: ABO/RH(D): O POS

## 2023-05-08 LAB — TYPE AND SCREEN
ABO/RH(D): O POS
Antibody Screen: NEGATIVE

## 2023-05-08 LAB — GLUCOSE, CAPILLARY
Glucose-Capillary: 143 mg/dL — ABNORMAL HIGH (ref 70–99)
Glucose-Capillary: 152 mg/dL — ABNORMAL HIGH (ref 70–99)
Glucose-Capillary: 162 mg/dL — ABNORMAL HIGH (ref 70–99)
Glucose-Capillary: 197 mg/dL — ABNORMAL HIGH (ref 70–99)

## 2023-05-08 LAB — BASIC METABOLIC PANEL
Anion gap: 9 (ref 5–15)
BUN: 11 mg/dL (ref 8–23)
CO2: 28 mmol/L (ref 22–32)
Calcium: 8.1 mg/dL — ABNORMAL LOW (ref 8.9–10.3)
Chloride: 98 mmol/L (ref 98–111)
Creatinine, Ser: 0.56 mg/dL (ref 0.44–1.00)
GFR, Estimated: >60 mL/min (ref >60)
Glucose, Bld: 152 mg/dL — ABNORMAL HIGH (ref 70–99)
Potassium: 3.2 mmol/L — ABNORMAL LOW (ref 3.5–5.1)
Sodium: 135 mmol/L (ref 135–145)

## 2023-05-08 LAB — CULTURE, BLOOD (ROUTINE X 2)
Culture: NO GROWTH
Culture: NO GROWTH
Special Requests: ADEQUATE
Special Requests: ADEQUATE

## 2023-05-08 SURGERY — LAPAROTOMY, EXPLORATORY
Anesthesia: General

## 2023-05-08 MED ORDER — SODIUM CHLORIDE 0.9 % IV SOLN
2.0000 g | INTRAVENOUS | Status: AC
  Filled 2023-05-08: qty 2

## 2023-05-08 MED ORDER — FENTANYL CITRATE (PF) 100 MCG/2ML IJ SOLN
INTRAMUSCULAR | Status: AC
  Filled 2023-05-08: qty 2

## 2023-05-08 MED ORDER — ROCURONIUM BROMIDE 10 MG/ML (PF) SYRINGE
PREFILLED_SYRINGE | INTRAVENOUS | Status: AC
  Filled 2023-05-08: qty 10

## 2023-05-08 MED ORDER — SUCCINYLCHOLINE CHLORIDE 200 MG/10ML IV SOSY
PREFILLED_SYRINGE | INTRAVENOUS | Status: DC | PRN
  Administered 2023-05-08: 100 mg via INTRAVENOUS

## 2023-05-08 MED ORDER — CHLORHEXIDINE GLUCONATE 0.12 % MT SOLN
15.0000 mL | Freq: Once | OROMUCOSAL | Status: AC
  Administered 2023-05-08: 15 mL via OROMUCOSAL

## 2023-05-08 MED ORDER — HYDROMORPHONE HCL 1 MG/ML IJ SOLN
INTRAMUSCULAR | Status: AC
  Administered 2023-05-08: 0.25 mg via INTRAVENOUS
  Filled 2023-05-08: qty 1

## 2023-05-08 MED ORDER — LIDOCAINE 2% (20 MG/ML) 5 ML SYRINGE
INTRAMUSCULAR | Status: DC | PRN
  Administered 2023-05-08: 40 mg via INTRAVENOUS

## 2023-05-08 MED ORDER — DEXAMETHASONE SODIUM PHOSPHATE 10 MG/ML IJ SOLN
INTRAMUSCULAR | Status: AC
  Filled 2023-05-08: qty 1

## 2023-05-08 MED ORDER — SUGAMMADEX SODIUM 200 MG/2ML IV SOLN
INTRAVENOUS | Status: DC | PRN
  Administered 2023-05-08: 200 mg via INTRAVENOUS

## 2023-05-08 MED ORDER — DROPERIDOL 2.5 MG/ML IJ SOLN
0.6250 mg | Freq: Once | INTRAMUSCULAR | Status: AC | PRN
  Administered 2023-05-08: 0.625 mg via INTRAVENOUS

## 2023-05-08 MED ORDER — DEXAMETHASONE SODIUM PHOSPHATE 10 MG/ML IJ SOLN
INTRAMUSCULAR | Status: DC | PRN
  Administered 2023-05-08: 5 mg via INTRAVENOUS

## 2023-05-08 MED ORDER — PHENYLEPHRINE 80 MCG/ML (10ML) SYRINGE FOR IV PUSH (FOR BLOOD PRESSURE SUPPORT)
PREFILLED_SYRINGE | INTRAVENOUS | Status: DC | PRN
  Administered 2023-05-08 (×3): 80 ug via INTRAVENOUS

## 2023-05-08 MED ORDER — HYDROMORPHONE HCL 1 MG/ML IJ SOLN
0.2500 mg | INTRAMUSCULAR | Status: DC | PRN
  Administered 2023-05-08 (×3): 0.25 mg via INTRAVENOUS

## 2023-05-08 MED ORDER — ROCURONIUM BROMIDE 10 MG/ML (PF) SYRINGE
PREFILLED_SYRINGE | INTRAVENOUS | Status: DC | PRN
  Administered 2023-05-08: 10 mg via INTRAVENOUS
  Administered 2023-05-08: 40 mg via INTRAVENOUS

## 2023-05-08 MED ORDER — ONDANSETRON HCL 4 MG/2ML IJ SOLN
INTRAMUSCULAR | Status: AC
  Filled 2023-05-08: qty 2

## 2023-05-08 MED ORDER — FENTANYL CITRATE (PF) 100 MCG/2ML IJ SOLN
INTRAMUSCULAR | Status: DC | PRN
  Administered 2023-05-08: 25 ug via INTRAVENOUS
  Administered 2023-05-08 (×2): 50 ug via INTRAVENOUS
  Administered 2023-05-08: 25 ug via INTRAVENOUS
  Administered 2023-05-08: 50 ug via INTRAVENOUS

## 2023-05-08 MED ORDER — SUCCINYLCHOLINE CHLORIDE 200 MG/10ML IV SOSY
PREFILLED_SYRINGE | INTRAVENOUS | Status: AC
  Filled 2023-05-08: qty 10

## 2023-05-08 MED ORDER — ACETAMINOPHEN 10 MG/ML IV SOLN
INTRAVENOUS | Status: DC | PRN
Start: 2023-05-08 — End: 2023-05-08
  Administered 2023-05-08: 1000 mg via INTRAVENOUS

## 2023-05-08 MED ORDER — PROPOFOL 10 MG/ML IV BOLUS
INTRAVENOUS | Status: DC | PRN
  Administered 2023-05-08: 120 mg via INTRAVENOUS

## 2023-05-08 MED ORDER — FENTANYL CITRATE PF 50 MCG/ML IJ SOSY
25.0000 ug | PREFILLED_SYRINGE | INTRAMUSCULAR | Status: DC | PRN
  Administered 2023-05-08 (×2): 50 ug via INTRAVENOUS

## 2023-05-08 MED ORDER — LIDOCAINE HCL (PF) 2 % IJ SOLN
INTRAMUSCULAR | Status: AC
  Filled 2023-05-08: qty 5

## 2023-05-08 MED ORDER — ONDANSETRON HCL 4 MG/2ML IJ SOLN
4.0000 mg | Freq: Once | INTRAMUSCULAR | Status: AC
  Administered 2023-05-08: 4 mg via INTRAVENOUS

## 2023-05-08 MED ORDER — PHENYLEPHRINE HCL-NACL 20-0.9 MG/250ML-% IV SOLN
INTRAVENOUS | Status: DC | PRN
Start: 2023-05-08 — End: 2023-05-08
  Administered 2023-05-08: 80 ug via INTRAVENOUS
  Administered 2023-05-08 (×2): 160 ug via INTRAVENOUS

## 2023-05-08 MED ORDER — ACETAMINOPHEN 10 MG/ML IV SOLN
INTRAVENOUS | Status: AC
  Filled 2023-05-08: qty 100

## 2023-05-08 MED ORDER — LACTATED RINGERS IV SOLN: INTRAVENOUS | Status: DC

## 2023-05-08 MED ORDER — DEXTROSE 5 % IV SOLN
INTRAVENOUS | Status: DC | PRN
  Administered 2023-05-08: 2 g via INTRAVENOUS

## 2023-05-08 MED ORDER — POTASSIUM CHLORIDE 10 MEQ/100ML IV SOLN
10.0000 meq | INTRAVENOUS | Status: AC
  Administered 2023-05-08 (×3): 10 meq via INTRAVENOUS

## 2023-05-08 MED ORDER — PROPOFOL 10 MG/ML IV BOLUS
INTRAVENOUS | Status: AC
  Filled 2023-05-08: qty 20

## 2023-05-08 MED ORDER — DROPERIDOL 2.5 MG/ML IJ SOLN
INTRAMUSCULAR | Status: AC
  Filled 2023-05-08: qty 2

## 2023-05-08 MED ORDER — LACTATED RINGERS IV SOLN
INTRAVENOUS | Status: DC | PRN
Start: 2023-05-08 — End: 2023-05-08

## 2023-05-08 MED ORDER — FENTANYL CITRATE PF 50 MCG/ML IJ SOSY
PREFILLED_SYRINGE | INTRAMUSCULAR | Status: AC
  Administered 2023-05-08: 50 ug via INTRAVENOUS
  Filled 2023-05-08: qty 3

## 2023-05-08 MED ORDER — ONDANSETRON HCL 4 MG/2ML IJ SOLN
INTRAMUSCULAR | Status: DC | PRN
  Administered 2023-05-08: 4 mg via INTRAVENOUS

## 2023-05-08 MED ORDER — SODIUM CHLORIDE 0.9 % IR SOLN
Status: DC | PRN
  Administered 2023-05-08: 1000 mL
  Administered 2023-05-08: 2000 mL

## 2023-05-08 SURGICAL SUPPLY — 55 items
APL PRP STRL LF DISP 70% ISPRP (MISCELLANEOUS) ×1
APL SWBSTK 6 STRL LF DISP (MISCELLANEOUS)
APPLICATOR COTTON TIP 6 STRL (MISCELLANEOUS) ×2 IMPLANT
APPLICATOR COTTON TIP 6IN STRL (MISCELLANEOUS) IMPLANT
BAG COUNTER SPONGE SURGICOUNT (BAG) IMPLANT
BAG SPNG CNTER NS LX DISP (BAG)
BLADE EXTENDED COATED 6.5IN (ELECTRODE) IMPLANT
BLADE HEX COATED 2.75 (ELECTRODE) ×1 IMPLANT
BLADE SURG SZ10 CARB STEEL (BLADE) IMPLANT
CHLORAPREP W/TINT 26 (MISCELLANEOUS) ×1 IMPLANT
CLIP TI LARGE 6 (CLIP) IMPLANT
COVER MAYO STAND STRL (DRAPES) ×1 IMPLANT
COVER SURGICAL LIGHT HANDLE (MISCELLANEOUS) ×1 IMPLANT
DRAPE LAPAROSCOPIC ABDOMINAL (DRAPES) ×1 IMPLANT
DRAPE SHEET LG 3/4 BI-LAMINATE (DRAPES) IMPLANT
DRAPE WARM FLUID 44X44 (DRAPES) ×1 IMPLANT
ELECT REM PT RETURN 15FT ADLT (MISCELLANEOUS) ×1 IMPLANT
GAUZE PAD ABD 8X10 STRL (GAUZE/BANDAGES/DRESSINGS) IMPLANT
GAUZE SPONGE 4X4 12PLY STRL (GAUZE/BANDAGES/DRESSINGS) ×1 IMPLANT
GLOVE BIO SURGEON STRL SZ7.5 (GLOVE) ×2 IMPLANT
GLOVE BIOGEL PI IND STRL 7.0 (GLOVE) ×1 IMPLANT
GOWN STRL REUS W/ TWL LRG LVL3 (GOWN DISPOSABLE) ×1 IMPLANT
GOWN STRL REUS W/ TWL XL LVL3 (GOWN DISPOSABLE) ×1 IMPLANT
GOWN STRL REUS W/TWL LRG LVL3 (GOWN DISPOSABLE) ×1
GOWN STRL REUS W/TWL XL LVL3 (GOWN DISPOSABLE) ×1
HANDLE SUCTION POOLE (INSTRUMENTS) ×1 IMPLANT
KIT BASIN OR (CUSTOM PROCEDURE TRAY) ×1 IMPLANT
KIT TURNOVER KIT A (KITS) IMPLANT
LEGGING LITHOTOMY PAIR STRL (DRAPES) IMPLANT
LIGASURE IMPACT 36 18CM CVD LR (INSTRUMENTS) IMPLANT
NS IRRIG 1000ML POUR BTL (IV SOLUTION) ×2 IMPLANT
PACK GENERAL/GYN (CUSTOM PROCEDURE TRAY) ×1 IMPLANT
RELOAD PROXIMATE 75MM BLUE (ENDOMECHANICALS) ×2 IMPLANT
RELOAD STAPLE 75 3.8 BLU REG (ENDOMECHANICALS) IMPLANT
SHEARS HARMONIC 36 ACE (MISCELLANEOUS) IMPLANT
SPONGE T-LAP 18X18 ~~LOC~~+RFID (SPONGE) IMPLANT
STAPLER PROXIMATE 75MM BLUE (STAPLE) ×1 IMPLANT
STAPLER VISISTAT 35W (STAPLE) ×1 IMPLANT
SUCTION POOLE HANDLE (INSTRUMENTS) ×1 IMPLANT
SUT NOV 1 T60/GS (SUTURE) IMPLANT
SUT NOVA NAB DX-16 0-1 5-0 T12 (SUTURE) IMPLANT
SUT NOVA T20/GS 25 (SUTURE) IMPLANT
SUT PDS AB 1 TP1 96 (SUTURE) ×2 IMPLANT
SUT SILK 2 0 (SUTURE) ×1
SUT SILK 2 0 SH CR/8 (SUTURE) ×1 IMPLANT
SUT SILK 2 0SH CR/8 30 (SUTURE) IMPLANT
SUT SILK 2-0 18XBRD TIE 12 (SUTURE) ×1 IMPLANT
SUT SILK 2-0 30XBRD TIE 12 (SUTURE) IMPLANT
SUT SILK 3 0 (SUTURE) ×2
SUT SILK 3 0 SH CR/8 (SUTURE) ×1 IMPLANT
SUT SILK 3-0 18XBRD TIE 12 (SUTURE) ×2 IMPLANT
TOWEL OR 17X26 10 PK STRL BLUE (TOWEL DISPOSABLE) ×2 IMPLANT
TRAY FOLEY MTR SLVR 16FR STAT (SET/KITS/TRAYS/PACK) ×1 IMPLANT
WATER STERILE IRR 1000ML POUR (IV SOLUTION) ×1 IMPLANT
YANKAUER SUCT BULB TIP NO VENT (SUCTIONS) ×1 IMPLANT

## 2023-05-08 NOTE — Progress Notes (Signed)
PT Cancellation Note  Patient Details Name: Patricia Peters MRN: [id number] DOB: 1936/02/28   Cancelled Treatment:    Reason Eval/Treat Not Completed: Medical issues which prohibited therapy including nausea and fatigue. Pt is scheduled for procedure this afternoon.   Johnny Bridge, PT Acute Rehab  Jacqualyn Posey 05/08/2023, 12:11 PM

## 2023-05-08 NOTE — Op Note (Addendum)
05/08/2023  4:44 PM  PATIENT:  Patricia Peters  87 y.o. female  PRE-OPERATIVE DIAGNOSIS:  SMALL BOWEL OBSTRUCTION  POST-OPERATIVE DIAGNOSIS:  SMALL BOWEL OBSTRUCTION   PROCEDURE:  Procedure(s): EXPLORATORY LAPAROTOMY WITH EXTENSIVE LYSIS OF ADHESSIONS (N/A)  SURGEON:  Surgeons and Role:    * Griselda Miner, MD - Primary    * White, Stephanie Coup, MD - Assisting  PHYSICIAN ASSISTANT:   ASSISTANTS: Dr. Cliffton Asters   ANESTHESIA:   general  EBL:  100 mL   BLOOD ADMINISTERED:none  DRAINS: none   LOCAL MEDICATIONS USED:  NONE  SPECIMEN:  No Specimen  DISPOSITION OF SPECIMEN:  N/A  COUNTS:  YES  TOURNIQUET:  * No tourniquets in log *  DICTATION: .Dragon Dictation  After informed consent was obtained the patient was brought to the operating room and placed in the supine position on the operating table.  After adequate induction of general anesthesia the patient's abdomen was prepped with ChloraPrep, allowed to dry, and draped in usual sterile manner.  An appropriate timeout was performed.  A midline incision was made with a 10 blade knife.  The incision was carried through the skin and subcutaneous tissue sharply with the electrocautery until the linea alba was identified.  The linea alba was incised with the electrocautery.  Several Prolene stitches were identified and removed.  There was loops of small bowel adherent to the anterior abdominal wall.  These were taken down sharply with Metzenbaum scissors.  Once we were able to find some free space into the abdominal cavity we were then able to begin running the bowel from the ligament of Treitz to the ileocecal valve.  There were many adhesions between loops of bowel that were taken down sharply with the Metzenbaum scissors.  This was very tedious dissection that took about an hour.  We were able to identify 1 dense band of scar tissue between loops of small bowel that was the source of the obstruction.  This dense band of adhesion was  divided with a single firing of a GIA 75 stapler.  1 small serosal tear was repaired with interrupted 3-0 silk stitches.  Once the obstruction was relieved the bowel appeared to pink up and looked viable.  I was able to palpate the right colon and the colon had a very normal appearance and a normal feel.  There did not appear to be any identifiable mass in the right colon.  The appendix looked normal.  No other abnormalities were noted on general inspection of the abdominal cavity.  The NG tube was confirmed in good position in the stomach.  At this point the abdominal cavity was irrigated with copious amounts of saline.  The fascia of the anterior abdominal wall was closed with 2 running #1 double-stranded looped PDS sutures.  The skin was closed with Cavey.  Sterile dressings were applied.  The patient tolerated the procedure well.  At the end of the case all needle sponge and instrument counts were correct.  The patient was then awakened and taken to recovery in stable condition.  The assistant was instrumental in visualization and completion of the case.  PLAN OF CARE: Admit to inpatient   PATIENT DISPOSITION:  PACU - hemodynamically stable.   Delay start of Pharmacological VTE agent (>24hrs) due to surgical blood loss or risk of bleeding: no

## 2023-05-08 NOTE — Progress Notes (Signed)
PROGRESS NOTE Patricia Peters  ZOX:[id number] DOB: 1935-08-28 DOA: 05/02/2023 PCP: Ollen Bowl, MD  Brief Narrative/Hospital Course: Patricia Peters is a 87 y.o. female with a history of cholecystectomy.  Patient presented secondary to extreme fatigue, upper abdominal pain, nausea, vomiting and diarrhea. Patient was found to have evidence of influenza A infection in addition to SBO with transition point. General surgery consulted with recommendation for conservative management. NG tube placed. Patient started on oseltamivir for influenza A treatment. Patient followed by general surgery    Subjective: Patient seen and examined this morning no shortness of breath chest pain fever chills.  Has cough, NG tube in place, no flatus or BM yet.   Reports for surgery later today Labs shows low potassium 3.2 and replacement started earlier NG output 200 cc  Assessment and Plan: Principal Problem:   SBO (small bowel obstruction) (HCC) Active Problems:   Anxiety   Influenza due to identified novel influenza A virus with other respiratory manifestations   Infrarenal abdominal aortic aneurysm (AAA) without rupture (HCC)   Hyperglycemia   Dehydration   Hypoxia   Thrombocytopenia (HCC)   CKD (chronic kidney disease) stage 2, GFR 60-89 ml/min   Memory changes  SBO: Patient had CT scan w/transition point in right mid/lower abdomen CCS following on NGT decompression IVF ,NPO without improvement, x-ray abdomen this morning shows persistent gaseous small bowel distention up to 5 cm progressive enteric contrast in the colon.  Surgery planning for OR today. Continue current IV fluids pain management   Enhancement/inflammation along the medial cecum without discrete/visualize appendix Small to moderate abdominopelvic ascites: As per radiology and surgery concerned about a mass in her cecum that may be causing partial obstruction versus being a separate finding\ [facility] was consulted for colonoscopy and  agreeable to do if SBO resolves. CEA within normal limit.   Influenza A infection, onset 9/12 Thursday: Mild symptoms with cough currently no fever., not hypoxic.  Procalcitonin negative and chest x-ray unremarkable, continue antitussive, bronchodilators scheduled/as needed.  Not needing oxygen.  Completed Tamiflu.   Abnormal urinalysis-negative urine culture. Likely asymptomatic bacteruria.No treatment needed.   Thrombocytopenia: Likely from acute illness and viral infection, trending up.  Monitor. Recent Labs  Lab 05/04/23 0444 05/05/23 0457 05/06/23 0500 05/07/23 0512 05/08/23 0449  PLT 95* 91* 96* 100* 114*     Hypokalemia: Replacing again iv run and with ivf+ kcl. Monitor Recent Labs  Lab 05/03/23 0430 05/04/23 0444 05/06/23 0500 05/07/23 0512 05/08/23 0449  K 4.2 3.7 3.1* 3.1* 3.2*  CALCIUM 8.5* 8.5* 8.4* 8.2* 8.1*  MG  --   --  2.0  --   --    Hyperglycemia Prediabetes HbA1c 6.4: Stable cbg, need to see pcp on discharge.  Monitor blood sugar  Dehydration Secondary to nausea/vomiting/SBO.Keep on ivf   Bilateral pleural effusions mild: Incidentally noted on CT scan, Monitor not hypoxic   Abdominal aortic aneurysm Incidental finding noted in the CT abdomen on admission-Aneurysm is infrarenal and measures 2.8 cm. Recommendation for outpatient vascular surgery follow-up-this was inforemed to the patient and family.  DVT prophylaxis: Place and maintain sequential compression device Start: 05/02/23 2129 Code Status:   Code Status: Full Code Family Communication: plan of care discussed with patient/ her daughter at bedside. Patient status is: Inpatient because of SBO Level of care: Med-Surg   Dispo: The patient is from: home            Anticipated disposition: TBD Objective: Vitals last 24 hrs: Vitals:  05/07/23 0642 05/07/23 1423 05/07/23 2041 05/08/23 0534  BP: (!) 157/86 123/67 (!) 143/74 (!) 152/79  Pulse: 87 100 87 96  Resp: 18 18 18 18   Temp: (!)  97.5 F (36.4 C) 98.4 F (36.9 C) 98.5 F (36.9 C)   TempSrc: Oral Oral Oral   SpO2: 92% 93% 90% 93%  Weight:      Height:       Weight change:   Physical Examination: General exam: alert awake, oriented at baseline NGT+ HEENT:Oral mucosa moist, Ear/Nose WNL grossly Respiratory system: Bilaterally clear BS,no use of accessory muscle Cardiovascular system: S1 & S2 +, No JVD. Gastrointestinal system: Abdomen soft, full , non tender Nervous System: Alert, awake, moving all extremities,and following commands. Extremities: LE edema neg,distal peripheral pulses palpable and warm.  Skin: No rashes,no icterus. MSK: Normal muscle bulk,tone, power General exam: alert awake, oriented, ill appearing,NTGT+  medications reviewed:  Scheduled Meds:  bisacodyl  10 mg Rectal Daily   lidocaine  1 patch Transdermal Q24H   pantoprazole  40 mg Oral Daily  Continuous Infusions:  dextrose 5 % and 0.9 % NaCl with KCl 20 mEq/L 75 mL/hr at 05/07/23 1449   methocarbamol (ROBAXIN) IV     ondansetron (ZOFRAN) IV     potassium chloride 10 mEq (05/08/23 1023)    Diet Order             Diet NPO time specified Except for: Ice Chips  Diet effective now                    Intake/Output Summary (Last 24 hours) at 05/08/2023 1044 Last data filed at 05/08/2023 1000 Gross per 24 hour  Intake 1858.13 ml  Output 1525 ml  Net 333.13 ml   Net IO Since Admission: 3,622.1 mL [05/08/23 1044]  Wt Readings from Last 3 Encounters:  05/02/23 68.9 kg  04/06/17 82.6 kg  03/28/17 82.1 kg     Unresulted Labs (From admission, onward)     Start     Ordered   05/07/23 0500  Basic metabolic panel  Daily,   R     Question:  Specimen collection method  Answer:  Lab=Lab collect   05/06/23 1115   05/07/23 0500  CBC  Daily,   R     Question:  Specimen collection method  Answer:  Lab=Lab collect   05/06/23 1115          Data Reviewed: I have personally reviewed following labs and imaging studies CBC: Recent  Labs  Lab 05/04/23 0444 05/05/23 0457 05/06/23 0500 05/07/23 0512 05/08/23 0449  WBC 8.8 6.8 6.1 7.3 7.3  HGB 12.9 11.4* 11.8* 11.8* 11.4*  HCT 40.5 36.9 38.3 36.5 35.0*  MCV 93.3 96.9 95.5 93.4 91.4  PLT 95* 91* 96* 100* 114*   Basic Metabolic Panel: Recent Labs  Lab 05/03/23 0430 05/04/23 0444 05/06/23 0500 05/07/23 0512 05/08/23 0449  NA 135 140 142 139 135  K 4.2 3.7 3.1* 3.1* 3.2*  CL 102 104 106 101 98  CO2 25 28 24 24 28   GLUCOSE 175* 133* 85 75 152*  BUN 48* 41* 24* 17 11  CREATININE 0.99 1.00 0.69 0.59 0.56  CALCIUM 8.5* 8.5* 8.4* 8.2* 8.1*  MG  --   --  2.0  --   --    GFR: Estimated Creatinine Clearance: 48.1 mL/min (by C-G formula based on SCr of 0.56 mg/dL). Liver Function Tests: Recent Labs  Lab 05/02/23 1621 05/06/23 0500  AST  26 30  ALT 17 42  ALKPHOS 38 36*  BILITOT 0.7 1.2  PROT 6.6 5.5*  ALBUMIN 3.9 3.0*   Recent Labs  Lab 05/02/23 1621  LIPASE <10*  CBG: Recent Labs  Lab 05/07/23 0639 05/07/23 1150 05/07/23 1740 05/07/23 2355 05/08/23 0536  GLUCAP 85 99 142* 147* 152*   Recent Labs  Lab 05/02/23 1742 05/02/23 2030 05/07/23 0512  PROCALCITON  --   --  <0.10  LATICACIDVEN 1.3 1.5  --     Recent Results (from the past 240 hour(s))  Resp panel by RT-PCR (RSV, Flu A&B, Covid) Anterior Nasal Swab     Status: Abnormal   Collection Time: 05/02/23  3:48 PM   Specimen: Anterior Nasal Swab  Result Value Ref Range Status   SARS Coronavirus 2 by RT PCR NEGATIVE NEGATIVE Final    Comment: (NOTE) SARS-CoV-2 target nucleic acids are NOT DETECTED.  The SARS-CoV-2 RNA is generally detectable in upper respiratory specimens during the acute phase of infection. The lowest concentration of SARS-CoV-2 viral copies this assay can detect is 138 copies/mL. A negative result does not preclude SARS-Cov-2 infection and should not be used as the sole basis for treatment or other patient management decisions. A negative result may occur with   improper specimen collection/handling, submission of specimen other than nasopharyngeal swab, presence of viral mutation(s) within the areas targeted by this assay, and inadequate number of viral copies(<138 copies/mL). A negative result must be combined with clinical observations, patient history, and epidemiological information. The expected result is Negative.  Fact Sheet for Patients:  BloggerCourse.com  Fact Sheet for Healthcare Providers:  SeriousBroker.it  This test is no t yet approved or cleared by the [place] FDA and  has been authorized for detection and/or diagnosis of SARS-CoV-2 by FDA under an Emergency Use Authorization (EUA). This EUA will remain  in effect (meaning this test can be used) for the duration of the COVID-19 declaration under Section 564(b)(1) of the Act, 21 U.S.C.section 360bbb-3(b)(1), unless the authorization is terminated  or revoked sooner.       Influenza A by PCR POSITIVE (A) NEGATIVE Final   Influenza B by PCR NEGATIVE NEGATIVE Final    Comment: (NOTE) The Xpert Xpress SARS-CoV-2/FLU/RSV plus assay is intended as an aid in the diagnosis of influenza from Nasopharyngeal swab specimens and should not be used as a sole basis for treatment. Nasal washings and aspirates are unacceptable for Xpert Xpress SARS-CoV-2/FLU/RSV testing.  Fact Sheet for Patients: BloggerCourse.com  Fact Sheet for Healthcare Providers: SeriousBroker.it  This test is not yet approved or cleared by the [place] FDA and has been authorized for detection and/or diagnosis of SARS-CoV-2 by FDA under an Emergency Use Authorization (EUA). This EUA will remain in effect (meaning this test can be used) for the duration of the COVID-19 declaration under Section 564(b)(1) of the Act, 21 U.S.C. section 360bbb-3(b)(1), unless the authorization is terminated  or revoked.     Resp Syncytial Virus by PCR NEGATIVE NEGATIVE Final    Comment: (NOTE) Fact Sheet for Patients: BloggerCourse.com  Fact Sheet for Healthcare Providers: SeriousBroker.it  This test is not yet approved or cleared by the [place] FDA and has been authorized for detection and/or diagnosis of SARS-CoV-2 by FDA under an Emergency Use Authorization (EUA). This EUA will remain in effect (meaning this test can be used) for the duration of the COVID-19 declaration under Section 564(b)(1) of the Act, 21 U.S.C. section 360bbb-3(b)(1), unless the authorization is terminated or  revoked.  Performed at [facility], [address] [place], [place] [address]   Urine Culture     Status: Abnormal   Collection Time: 05/02/23  6:05 PM   Specimen: Urine, Clean Catch  Result Value Ref Range Status   Specimen Description   Final    URINE, CLEAN CATCH Performed at [facility], [address] [place], [place] [address]    Special Requests   Final    NONE Performed at [facility], [address], [place], [place] [address]    Culture MULTIPLE SPECIES PRESENT, SUGGEST RECOLLECTION (A)  Final   Report Status 05/03/2023 FINAL  Final  Blood culture (routine x 2)     Status: None   Collection Time: 05/02/23  8:30 PM   Specimen: BLOOD RIGHT ARM  Result Value Ref Range Status   Specimen Description   Final    BLOOD RIGHT ARM Performed at [facility], [address] [place], [place] [address]    Special Requests   Final    BOTTLES DRAWN AEROBIC AND ANAEROBIC Blood Culture adequate volume Performed at [facility], [address], [place], [place] [address]    Culture   Final    NO GROWTH 5 DAYS Performed at [facility], [address], [place], [place] [address]    Report Status 05/08/2023 FINAL  Final  Blood culture (routine x 2)      Status: None   Collection Time: 05/02/23  8:39 PM   Specimen: BLOOD RIGHT ARM  Result Value Ref Range Status   Specimen Description   Final    BLOOD RIGHT ARM Performed at [facility], [address] [place], [place] [address]    Special Requests   Final    BOTTLES DRAWN AEROBIC ONLY Blood Culture adequate volume Performed at [facility], [address], [place], [place] [address]    Culture   Final    NO GROWTH 5 DAYS Performed at [facility], [address], [place], [place] [address]    Report Status 05/08/2023 FINAL  Final  Surgical pcr screen     Status: None   Collection Time: 05/07/23  8:23 PM   Specimen: Nasal Mucosa; Nasal Swab  Result Value Ref Range Status   MRSA, PCR NEGATIVE NEGATIVE Final   Staphylococcus aureus NEGATIVE NEGATIVE Final    Comment: (NOTE) The Xpert SA Assay (FDA approved for NASAL specimens in patients 38 years of age and older), is one component of a comprehensive surveillance program. It is not intended to diagnose infection nor to guide or monitor treatment. Performed at [facility], [address] [place], [place] [address]     Antimicrobials: Anti-infectives (From admission, onward)    Start     Dose/Rate Route Frequency Ordered Stop   05/03/23 1000  piperacillin-tazobactam (ZOSYN) IVPB 3.375 g  Status:  Discontinued        3.375 g 12.5 mL/hr over 240 Minutes Intravenous Every 8 hours 05/02/23 2213 05/04/23 1040   05/02/23 2200  oseltamivir (TAMIFLU) capsule 30 mg        30 mg Oral 2 times daily 05/02/23 2131 05/07/23 0845   05/02/23 1800  piperacillin-tazobactam (ZOSYN) IVPB 3.375 g        3.375 g 12.5 mL/hr over 240 Minutes Intravenous  Once 05/02/23 1759 05/03/23 0542      Culture/Microbiology    Component Value Date/Time   SDES  05/02/2023 2039    BLOOD RIGHT ARM Performed at [facility], [address] [place], [place] [address]    SPECREQUEST  05/02/2023  2039    BOTTLES DRAWN AEROBIC ONLY Blood Culture adequate volume Performed at [facility], [address] [place], [place] [address]    CULT  05/02/2023 2039    NO GROWTH 5 DAYS Performed at [facility], [address], [place], [place] [address]    REPTSTATUS 05/08/2023 FINAL 05/02/2023 2039    Radiology Studies: DG Abd Portable 1V  Result Date: 05/08/2023 CLINICAL DATA:  Small bowel obstruction. EXAM: PORTABLE ABDOMEN - 1 VIEW COMPARISON:  Radiograph yesterday. FINDINGS: Progressive enteric contrast in the colon. There is persistent gaseous small bowel in the central and right abdomen, small bowel distention up to 5 cm. Tip of the enteric tube below the diaphragm in the stomach. Right upper quadrant surgical clips. IMPRESSION: Persistent gaseous small bowel distention up to 5 cm. Progressive enteric contrast in the colon. Electronically Signed   By: Narda Rutherford M.D.   On: 05/08/2023 08:53   DG Chest Port 1 View  Result Date: 05/07/2023 CLINICAL DATA:  Cough EXAM: PORTABLE CHEST 1 VIEW COMPARISON:  None Available. FINDINGS: The enteric catheter side hole is at the level of the GE junction. The heart is enlarged.  The upper mediastinal contours are normal. There is no focal consolidation or pulmonary edema. There is no pleural effusion or pneumothorax There is no acute osseous abnormality. IMPRESSION: 1. Enteric catheter sidehole at the level of the GE junction. Consider advancement by approximately 3 cm. 2. Cardiomegaly. 3. No focal consolidation or pleural effusion. Electronically Signed   By: Lesia Hausen M.D.   On: 05/07/2023 13:43   DG Abd Portable 1V  Result Date: 05/07/2023 CLINICAL DATA:  Follow-up small bowel obstruction EXAM: PORTABLE ABDOMEN - 1 VIEW COMPARISON:  KUB 1 day prior FINDINGS: The enteric catheter tip and sidehole projects over the stomach. There is no definite free intraperitoneal air. Gaseous distention of the small bowel in the  midabdomen is similar to the prior study. Contrast is seen in the right colon extending to the transverse colon, similar to the prior study. Abdominal surgical clips are again noted. IMPRESSION: No significant change in gas distended small bowel in the midabdomen with contrast again seen in the ascending and transverse colon. Electronically Signed   By: Lesia Hausen M.D.   On: 05/07/2023 13:01     LOS: 6 days   Lanae Boast, MD [facility]  05/08/2023, 10:44 AM

## 2023-05-08 NOTE — Transfer of Care (Signed)
Immediate Anesthesia Transfer of Care Note  Patient: Patricia Peters  Procedure(s) Performed: EXPLORATORY LAPAROTOMY WITH EXTENSIVE LYSIS OF ADHESSIONS  Patient Location: PACU  Anesthesia Type:General  Level of Consciousness: drowsy and patient cooperative  Airway & Oxygen Therapy: Patient Spontanous Breathing and Patient connected to face mask oxygen  Post-op Assessment: Report given to RN and Post -op Vital signs reviewed and stable  Post vital signs: Reviewed and stable  Last Vitals:  Vitals Value Taken Time  BP 163/99 05/08/23 1656  Temp    Pulse 91 05/08/23 1700  Resp 19 05/08/23 1700  SpO2 99 % 05/08/23 1700  Vitals shown include unfiled device data.  Last Pain:  Vitals:   05/08/23 1342  TempSrc: Oral  PainSc: 0-No pain         Complications: No notable events documented.

## 2023-05-08 NOTE — Anesthesia Preprocedure Evaluation (Addendum)
Anesthesia Evaluation    Reviewed: Allergy & Precautions, Patient's Chart, lab work & pertinent test results  Airway Mallampati: II  TM Distance: >3 FB Neck ROM: Full    Dental no notable dental hx.    Pulmonary neg pulmonary ROS Flu positive 6 days ago; mild symptoms with cough currently no fever; not hypoxic   Pulmonary exam normal breath sounds clear to auscultation       Cardiovascular negative cardio ROS Normal cardiovascular exam Rhythm:Regular Rate:Normal     Neuro/Psych  PSYCHIATRIC DISORDERS Anxiety     negative neurological ROS     GI/Hepatic Neg liver ROS,GERD  ,,  Endo/Other  negative endocrine ROS    Renal/GU Renal InsufficiencyRenal disease  negative genitourinary   Musculoskeletal negative musculoskeletal ROS (+)    Abdominal   Peds  Hematology  (+) Blood dyscrasia, anemia Lab Results      Component                Value               Date                      WBC                      7.3                 05/08/2023                HGB                      11.4 (L)            05/08/2023                HCT                      35.0 (L)            05/08/2023                MCV                      91.4                05/08/2023                PLT                      114 (L)             05/08/2023              Anesthesia Other Findings SBO NG tube to suction  Reproductive/Obstetrics                             Anesthesia Physical Anesthesia Plan  ASA: 2  Anesthesia Plan: General   Post-op Pain Management: Ofirmev IV (intra-op)*   Induction: Intravenous and Rapid sequence  PONV Risk Score and Plan: 3 and Dexamethasone, Ondansetron and Treatment may vary due to age or medical condition  Airway Management Planned: Oral ETT  Additional Equipment:   Intra-op Plan:   Post-operative Plan: Extubation in OR  Informed Consent: I have reviewed the patients History and  Physical, chart, labs and discussed the procedure including the risks, benefits and alternatives for the proposed anesthesia with  the patient or authorized representative who has indicated his/her understanding and acceptance.     Dental advisory given  Plan Discussed with: CRNA  Anesthesia Plan Comments:        Anesthesia Quick Evaluation

## 2023-05-08 NOTE — Anesthesia Procedure Notes (Signed)
Procedure Name: Intubation Date/Time: 05/08/2023 3:21 PM  Performed by: Epimenio Sarin, CRNAPre-anesthesia Checklist: Patient identified, Emergency Drugs available, Suction available, Patient being monitored and Timeout performed Patient Re-evaluated:Patient Re-evaluated prior to induction Oxygen Delivery Method: Circle system utilized Preoxygenation: Pre-oxygenation with 100% oxygen Induction Type: IV induction, Rapid sequence and Cricoid Pressure applied Laryngoscope Size: Mac and 3 Grade View: Grade II Tube type: Oral Tube size: 7.0 mm Number of attempts: 1 Airway Equipment and Method: Stylet Placement Confirmation: ETT inserted through vocal cords under direct vision, positive ETCO2 and breath sounds checked- equal and bilateral Secured at: 22 cm Tube secured with: Tape Dental Injury: Teeth and Oropharynx as per pre-operative assessment

## 2023-05-08 NOTE — Anesthesia Postprocedure Evaluation (Addendum)
Anesthesia Post Note  Patient: Patricia Peters  Procedure(s) Performed: EXPLORATORY LAPAROTOMY WITH EXTENSIVE LYSIS OF ADHESSIONS     Patient location during evaluation: PACU Anesthesia Type: General Level of consciousness: awake and alert Pain management: pain level controlled Vital Signs Assessment: post-procedure vital signs reviewed and stable Respiratory status: spontaneous breathing, nonlabored ventilation, respiratory function stable and patient connected to nasal cannula oxygen Cardiovascular status: blood pressure returned to baseline and stable Postop Assessment: no apparent nausea or vomiting Anesthetic complications: no   No notable events documented.   Last Pain:  Vitals:   05/08/23 1342  TempSrc: Oral  PainSc: 0-No pain                 Sharon Nation

## 2023-05-08 NOTE — Interval H&P Note (Signed)
History and Physical Interval Note:  05/08/2023 2:36 PM  [name]  has presented today for surgery, with the diagnosis of POSSIBLE BOWEL RESECTION.  The various methods of treatment have been discussed with the patient and family. After consideration of risks, benefits and other options for treatment, the patient has consented to  Procedure(s): EXPLORATORY LAPAROTOMY WITH POSSIBLE RIGHT COLON RESECTION (N/A) PARTIAL COLECTOMY (N/A) as a surgical intervention.  The patient's history has been reviewed, patient examined, no change in status, stable for surgery.  I have reviewed the patient's chart and labs.  Questions were answered to the patient's satisfaction.     Patricia Peters

## 2023-05-09 ENCOUNTER — Encounter (HOSPITAL_COMMUNITY): Payer: Self-pay | Admitting: General Surgery

## 2023-05-09 DIAGNOSIS — K56609 Unspecified intestinal obstruction, unspecified as to partial versus complete obstruction: Secondary | ICD-10-CM | POA: Diagnosis not present

## 2023-05-09 LAB — CBC
HCT: 35.3 % — ABNORMAL LOW (ref 36.0–46.0)
Hemoglobin: 11.5 g/dL — ABNORMAL LOW (ref 12.0–15.0)
MCH: 30.3 pg (ref 26.0–34.0)
MCHC: 32.6 g/dL (ref 30.0–36.0)
MCV: 92.9 fL (ref 80.0–100.0)
Platelets: 141 10*3/uL — ABNORMAL LOW (ref 150–400)
RBC: 3.8 MIL/uL — ABNORMAL LOW (ref 3.87–5.11)
RDW: 13.5 % (ref 11.5–15.5)
WBC: 8.8 10*3/uL (ref 4.0–10.5)
nRBC: 0 % (ref 0.0–0.2)

## 2023-05-09 LAB — BASIC METABOLIC PANEL
Anion gap: 6 (ref 5–15)
BUN: 12 mg/dL (ref 8–23)
CO2: 27 mmol/L (ref 22–32)
Calcium: 8 mg/dL — ABNORMAL LOW (ref 8.9–10.3)
Chloride: 104 mmol/L (ref 98–111)
Creatinine, Ser: 0.63 mg/dL (ref 0.44–1.00)
GFR, Estimated: >60 mL/min (ref >60)
Glucose, Bld: 201 mg/dL — ABNORMAL HIGH (ref 70–99)
Potassium: 3.7 mmol/L (ref 3.5–5.1)
Sodium: 137 mmol/L (ref 135–145)

## 2023-05-09 LAB — GLUCOSE, CAPILLARY
Glucose-Capillary: 179 mg/dL — ABNORMAL HIGH (ref 70–99)
Glucose-Capillary: 185 mg/dL — ABNORMAL HIGH (ref 70–99)
Glucose-Capillary: 198 mg/dL — ABNORMAL HIGH (ref 70–99)

## 2023-05-09 MED ORDER — PANTOPRAZOLE SODIUM 40 MG IV SOLR
40.0000 mg | INTRAVENOUS | Status: DC
  Administered 2023-05-09 – 2023-05-12 (×4): 40 mg via INTRAVENOUS
  Filled 2023-05-09 (×4): qty 10

## 2023-05-09 MED ORDER — KCL IN DEXTROSE-NACL 20-5-0.9 MEQ/L-%-% IV SOLN
INTRAVENOUS | Status: DC
  Filled 2023-05-09 (×6): qty 1000

## 2023-05-09 MED ORDER — CHLORHEXIDINE GLUCONATE CLOTH 2 % EX PADS
6.0000 | MEDICATED_PAD | Freq: Every day | CUTANEOUS | Status: DC
  Administered 2023-05-09 – 2023-05-13 (×4): 6 via TOPICAL

## 2023-05-09 MED ORDER — HYDROCODONE BIT-HOMATROP MBR 5-1.5 MG/5ML PO SOLN
5.0000 mL | Freq: Four times a day (QID) | ORAL | Status: DC | PRN
  Administered 2023-05-09 – 2023-05-15 (×8): 5 mL via ORAL
  Filled 2023-05-09 (×8): qty 5

## 2023-05-09 MED ORDER — FENTANYL CITRATE PF 50 MCG/ML IJ SOSY
12.5000 ug | PREFILLED_SYRINGE | INTRAMUSCULAR | Status: DC | PRN
  Administered 2023-05-09 (×2): 25 ug via INTRAVENOUS
  Filled 2023-05-09 (×2): qty 1

## 2023-05-09 MED ORDER — METHOCARBAMOL 1000 MG/10ML IJ SOLN
500.0000 mg | Freq: Three times a day (TID) | INTRAVENOUS | Status: DC | PRN
  Filled 2023-05-09: qty 5

## 2023-05-09 NOTE — Progress Notes (Signed)
1 Day Post-Op   Subjective/Chief Complaint: Patient complains of pain.  Patient complains of congestion.   Objective: Vital signs in last 24 hours: Temp:  [97.4 F (36.3 C)-98.9 F (37.2 C)] 98.9 F (37.2 C) (09/21 0814) Pulse Rate:  [91-117] 102 (09/21 0814) Resp:  [15-20] 16 (09/21 0814) BP: (111-164)/(57-109) 143/85 (09/21 0814) SpO2:  [78 %-99 %] 99 % (09/21 0814) Weight:  [68.9 kg] 68.9 kg (09/20 1342) Last BM Date : 05/09/23  Intake/Output from previous day: 09/20 0701 - 09/21 0700 In: 2956.3 [P.O.:30; I.V.:2776.3; IV Piggyback:150] Out: 1700 [Urine:900; Blood:100] Intake/Output this shift: Total I/O In: -  Out: 100 [Urine:100]  Incision/Wound: Midline incision clean dry intact with honeycomb dressing in place.  No distention.  Appropriately tender  Lab Results:  Recent Labs    05/08/23 0449 05/09/23 0458  WBC 7.3 8.8  HGB 11.4* 11.5*  HCT 35.0* 35.3*  PLT 114* 141*   BMET Recent Labs    05/08/23 0449 05/09/23 0458  NA 135 137  K 3.2* 3.7  CL 98 104  CO2 28 27  GLUCOSE 152* 201*  BUN 11 12  CREATININE 0.56 0.63  CALCIUM 8.1* 8.0*   PT/INR No results for input(s): "LABPROT", "INR" in the last 72 hours. ABG No results for input(s): "PHART", "HCO3" in the last 72 hours.  Invalid input(s): "PCO2", "PO2"  Studies/Results: DG Abd Portable 1V  Result Date: 05/08/2023 CLINICAL DATA:  Small bowel obstruction. EXAM: PORTABLE ABDOMEN - 1 VIEW COMPARISON:  Radiograph yesterday. FINDINGS: Progressive enteric contrast in the colon. There is persistent gaseous small bowel in the central and right abdomen, small bowel distention up to 5 cm. Tip of the enteric tube below the diaphragm in the stomach. Right upper quadrant surgical clips. IMPRESSION: Persistent gaseous small bowel distention up to 5 cm. Progressive enteric contrast in the colon. Electronically Signed   By: Narda Rutherford M.D.   On: 05/08/2023 08:53   DG Chest Port 1 View  Result Date:  05/07/2023 CLINICAL DATA:  Cough EXAM: PORTABLE CHEST 1 VIEW COMPARISON:  None Available. FINDINGS: The enteric catheter side hole is at the level of the GE junction. The heart is enlarged.  The upper mediastinal contours are normal. There is no focal consolidation or pulmonary edema. There is no pleural effusion or pneumothorax There is no acute osseous abnormality. IMPRESSION: 1. Enteric catheter sidehole at the level of the GE junction. Consider advancement by approximately 3 cm. 2. Cardiomegaly. 3. No focal consolidation or pleural effusion. Electronically Signed   By: Lesia Hausen M.D.   On: 05/07/2023 13:43    Anti-infectives: Anti-infectives (From admission, onward)    Start     Dose/Rate Route Frequency Ordered Stop   05/08/23 1530  cefOXitin (MEFOXIN) 2 g in sodium chloride 0.9 % 100 mL IVPB        2 g 200 mL/hr over 30 Minutes Intravenous STAT 05/08/23 1519 05/09/23 1530   05/03/23 1000  piperacillin-tazobactam (ZOSYN) IVPB 3.375 g  Status:  Discontinued        3.375 g 12.5 mL/hr over 240 Minutes Intravenous Every 8 hours 05/02/23 2213 05/04/23 1040   05/02/23 2200  oseltamivir (TAMIFLU) capsule 30 mg        30 mg Oral 2 times daily 05/02/23 2131 05/07/23 0845   05/02/23 1800  piperacillin-tazobactam (ZOSYN) IVPB 3.375 g        3.375 g 12.5 mL/hr over 240 Minutes Intravenous  Once 05/02/23 1759 05/03/23 0542  Assessment/Plan: s/p Procedure(s): EXPLORATORY LAPAROTOMY WITH EXTENSIVE LYSIS OF ADHESSIONS (N/A)  Overall looks well  Respiratory status stable   NG tube in place-continue NG tube for today.  She has had a bowel movement so hopefully we can pull her NG tube tomorrow she has a good day  Out of bed  Recommend ambulation as tolerated      LOS: 7 days    Maisie Fus A Gibran Veselka 05/09/2023

## 2023-05-09 NOTE — Progress Notes (Signed)
PROGRESS NOTE Patricia Peters  BJY:[id number] DOB: [birth year] DOA: 05/02/2023 PCP: Ollen Bowl, MD  Brief Narrative/Hospital Course: Patricia Peters is a 87 y.o. female with a history of cholecystectomy.  Patient presented secondary to extreme fatigue, upper abdominal pain, nausea, vomiting and diarrhea. Patient was found to have evidence of influenza A infection in addition to SBO with transition point. General surgery consulted with recommendation for conservative management. NG tube placed. Patient started on oseltamivir for influenza A treatment.Patient followed by general surgery.  Patient overall has failed to improve with ongoing abdominal distention. Respiratory status has remained stable although cough, chest x-ray has been clear no pneumonia, completed Tamiflu. 9/20: Underwent ex laparotomy with extensive lysis of adhesion    Subjective: Patient seen and examined this morning On the bedside chair. Earlier refused to walk with nursing staff but after discussion with family she wants to walk now Complains of ongoing cough-changed to Hycodan Overnight patient has been afebrile temp around 97.5 BP 130s to 150s systolic, on 2 L [facility] overall stable platelet count improving, hemoglobin stable NGO none  Assessment and Plan: Principal Problem:   SBO (small bowel obstruction) (HCC) Active Problems:   Anxiety   Influenza due to identified novel influenza A virus with other respiratory manifestations   Infrarenal abdominal aortic aneurysm (AAA) without rupture (HCC)   Hyperglycemia   Dehydration   Hypoxia   Thrombocytopenia (HCC)   CKD (chronic kidney disease) stage 2, GFR 60-89 ml/min   Memory changes  SBO: Patient had CT scan w/transition point in right mid/lower abdomen [facility]CS following on NGT decompression IVF ,NPO without improvement, x-ray abdomen this morning shows persistent gaseous small bowel distention 9/20: Underwent ex laparotomy with extensive lysis of  adhesion Reports passing flatus , had BM hopefully can pull her NG tube out 9/22, continue plan of care per CCS, continue to mobilize.     Enhancement/inflammation along the medial cecum without discrete/visualize appendix Small to moderate abdominopelvic ascites: As per radiology and surgery concerned about a mass in her cecum that may be causing partial obstruction versus being a separate finding\ [facility] was consulted for colonoscopy and agreeable to do if SBO resolves.CEA within normal limit.   Influenza A infection, onset 9/12 Thursday: No Pneumonia.procal Negative, having cough,no fever.not hypoxic.ompleted Tamiflu. Cont nebs prn/scheduled.   Abnormal urinalysis-negative urine culture. Likely asymptomatic bacteruria.No treatment needed.   Thrombocytopenia: Likely from acute illness and viral infection, trending up as below Recent Labs  Lab 05/05/23 0457 05/06/23 0500 05/07/23 0512 05/08/23 0449 05/09/23 0458  PLT 91* 96* 100* 114* 141*     Hypokalemia: Resolved. On ivf w/ kcl Recent Labs  Lab 05/04/23 0444 05/06/23 0500 05/07/23 0512 05/08/23 0449 05/09/23 0458  K 3.7 3.1* 3.1* 3.2* 3.7  CALCIUM 8.5* 8.4* 8.2* 8.1* 8.0*  MG  --  2.0  --   --   --    Hyperglycemia Prediabetes HbA1c 6.4: Stable cbg, need to see pcp on discharge.  Monitor blood sugar  Dehydration Secondary to nausea/vomiting/SBO.Keep on ivf   Bilateral pleural effusions mild: Incidentally noted on CT scan, Monitor not hypoxic   Abdominal aortic aneurysm Incidental finding noted in the CT abdomen on admission-Aneurysm is infrarenal and measures 2.8 cm. Recommendation for outpatient vascular surgery follow-up-this was inforemed to the patient and family.  DVT prophylaxis: Place and maintain sequential compression device Start: 05/02/23 2129 Code Status:   Code Status: Full Code Family Communication: plan of care discussed with patient/ her daughter at bedside. Patient  status is: Inpatient  because of SBO Level of care: Med-Surg   Dispo: The patient is from: home            Anticipated disposition: TBD Objective: Vitals last 24 hrs: Vitals:   05/08/23 2225 05/09/23 0119 05/09/23 0520 05/09/23 0814  BP: (!) 141/81 (!) 143/84 (!) 156/79 (!) 143/85  Pulse: (!) 106 99 (!) 103 (!) 102  Resp: 18 17 15 16   Temp: 97.9 F (36.6 C) 98 F (36.7 C) (!) 97.5 F (36.4 C) 98.9 F (37.2 C)  TempSrc: Oral Oral Oral Oral  SpO2: 95% 95% 95% 99%  Weight:      Height:       Weight change:   Physical Examination: General exam: alert awake, oriented at baseline, older than stated age HEENT:Oral mucosa moist, Ear/Nose WNL grossly Respiratory system: Bilaterally clear BS,no use of accessory muscle Cardiovascular system: S1 & S2 +, No JVD. Gastrointestinal system: Abdomen soft, midline incision clean dry with a honeycomb dressing in place, bowel sounds present, tenderness at the surgical site as expected  Nervous System: Alert, awake, moving all extremities,and following commands. Extremities: LE edema neg,distal peripheral pulses palpable and warm.  Skin: No rashes,no icterus. MSK: Normal muscle bulk,tone, power   medications reviewed:  Scheduled Meds:  bisacodyl  10 mg Rectal Daily   Chlorhexidine Gluconate Cloth  6 each Topical Daily   lidocaine  1 patch Transdermal Q24H   pantoprazole (PROTONIX) IV  40 mg Intravenous Q24H  Continuous Infusions:  cefOXitin     dextrose 5 % and 0.9 % NaCl with KCl 20 mEq/L 100 mL/hr at 05/09/23 0517   methocarbamol (ROBAXIN) IV     ondansetron (ZOFRAN) IV      Diet Order             Diet NPO time specified Except for: Ice Chips  Diet effective now                    Intake/Output Summary (Last 24 hours) at 05/09/2023 1132 Last data filed at 05/09/2023 0816 Gross per 24 hour  Intake 2956.31 ml  Output 1800 ml  Net 1156.31 ml   Net IO Since Admission: 4,778.41 mL [05/09/23 1132]  Wt Readings from Last 3 Encounters:  05/08/23 68.9  kg  04/06/17 82.6 kg  03/28/17 82.1 kg     Unresulted Labs (From admission, onward)     Start     Ordered   05/07/23 0500  Basic metabolic panel  Daily,   R     Question:  Specimen collection method  Answer:  Lab=Lab collect   05/06/23 1115   05/07/23 0500  CBC  Daily,   R     Question:  Specimen collection method  Answer:  Lab=Lab collect   05/06/23 1115          Data Reviewed: I have personally reviewed following labs and imaging studies CBC: Recent Labs  Lab 05/05/23 0457 05/06/23 0500 05/07/23 0512 05/08/23 0449 05/09/23 0458  WBC 6.8 6.1 7.3 7.3 8.8  HGB 11.4* 11.8* 11.8* 11.4* 11.5*  HCT 36.9 38.3 36.5 35.0* 35.3*  MCV 96.9 95.5 93.4 91.4 92.9  PLT 91* 96* 100* 114* 141*   Basic Metabolic Panel: Recent Labs  Lab 05/04/23 0444 05/06/23 0500 05/07/23 0512 05/08/23 0449 05/09/23 0458  NA 140 142 139 135 137  K 3.7 3.1* 3.1* 3.2* 3.7  CL 104 106 101 98 104  CO2 28 24 24 28 27   GLUCOSE 133*  85 75 152* 201*  BUN 41* 24* 17 11 12   CREATININE 1.00 0.69 0.59 0.56 0.63  CALCIUM 8.5* 8.4* 8.2* 8.1* 8.0*  MG  --  2.0  --   --   --    GFR: Estimated Creatinine Clearance: 48.1 mL/min (by C-G formula based on SCr of 0.63 mg/dL). Liver Function Tests: Recent Labs  Lab 05/02/23 1621 05/06/23 0500  AST 26 30  ALT 17 42  ALKPHOS 38 36*  BILITOT 0.7 1.2  PROT 6.6 5.5*  ALBUMIN 3.9 3.0*   Recent Labs  Lab 05/02/23 1621  LIPASE <10*  CBG: Recent Labs  Lab 05/08/23 1136 05/08/23 1719 05/08/23 2340 05/09/23 0554 05/09/23 1129  GLUCAP 162* 143* 197* 198* 179*   Recent Labs  Lab 05/02/23 1742 05/02/23 2030 05/07/23 0512  PROCALCITON  --   --  <0.10  LATICACIDVEN 1.3 1.5  --     Recent Results (from the past 240 hour(s))  Resp panel by RT-PCR (RSV, Flu A&B, Covid) Anterior Nasal Swab     Status: Abnormal   Collection Time: 05/02/23  3:48 PM   Specimen: Anterior Nasal Swab  Result Value Ref Range Status   SARS Coronavirus 2 by RT PCR NEGATIVE  NEGATIVE Final    Comment: (NOTE) SARS-CoV-2 target nucleic acids are NOT DETECTED.  The SARS-CoV-2 RNA is generally detectable in upper respiratory specimens during the acute phase of infection. The lowest concentration of SARS-CoV-2 viral copies this assay can detect is 138 copies/mL. A negative result does not preclude SARS-Cov-2 infection and should not be used as the sole basis for treatment or other patient management decisions. A negative result may occur with  improper specimen collection/handling, submission of specimen other than nasopharyngeal swab, presence of viral mutation(s) within the areas targeted by this assay, and inadequate number of viral copies(<138 copies/mL). A negative result must be combined with clinical observations, patient history, and epidemiological information. The expected result is Negative.  Fact Sheet for Patients:  BloggerCourse.com  Fact Sheet for Healthcare Providers:  SeriousBroker.it  This test is no t yet approved or cleared by the [place] FDA and  has been authorized for detection and/or diagnosis of SARS-CoV-2 by FDA under an Emergency Use Authorization (EUA). This EUA will remain  in effect (meaning this test can be used) for the duration of the COVID-19 declaration under Section 564(b)(1) of the Act, 21 U.S.C.section 360bbb-3(b)(1), unless the authorization is terminated  or revoked sooner.       Influenza A by PCR POSITIVE (A) NEGATIVE Final   Influenza B by PCR NEGATIVE NEGATIVE Final    Comment: (NOTE) The Xpert Xpress SARS-CoV-2/FLU/RSV plus assay is intended as an aid in the diagnosis of influenza from Nasopharyngeal swab specimens and should not be used as a sole basis for treatment. Nasal washings and aspirates are unacceptable for Xpert Xpress SARS-CoV-2/FLU/RSV testing.  Fact Sheet for Patients: BloggerCourse.com  Fact Sheet for  Healthcare Providers: SeriousBroker.it  This test is not yet approved or cleared by the [place] FDA and has been authorized for detection and/or diagnosis of SARS-CoV-2 by FDA under an Emergency Use Authorization (EUA). This EUA will remain in effect (meaning this test can be used) for the duration of the COVID-19 declaration under Section 564(b)(1) of the Act, 21 U.S.C. section 360bbb-3(b)(1), unless the authorization is terminated or revoked.     Resp Syncytial Virus by PCR NEGATIVE NEGATIVE Final    Comment: (NOTE) Fact Sheet for Patients: BloggerCourse.com  Fact Sheet for  Healthcare Providers: SeriousBroker.it  This test is not yet approved or cleared by the [place] and has been authorized for detection and/or diagnosis of SARS-CoV-2 by FDA under an Emergency Use Authorization (EUA). This EUA will remain in effect (meaning this test can be used) for the duration of the COVID-19 declaration under Section 564(b)(1) of the Act, 21 U.S.C. section 360bbb-3(b)(1), unless the authorization is terminated or revoked.  Performed at [facility], [address], [place], [place] [address]   Urine Culture     Status: Abnormal   Collection Time: 05/02/23  6:05 PM   Specimen: Urine, Clean Catch  Result Value Ref Range Status   Specimen Description   Final    URINE, CLEAN CATCH Performed at [facility], [address], [place], [place] [address]    Special Requests   Final    NONE Performed at [facility], [address], [place], [place] [address]    Culture MULTIPLE SPECIES PRESENT, SUGGEST RECOLLECTION (A)  Final   Report Status 05/03/2023 FINAL  Final  Blood culture (routine x 2)     Status: None   Collection Time: 05/02/23  8:30 PM   Specimen: BLOOD RIGHT ARM  Result Value Ref Range Status   Specimen Description   Final     BLOOD RIGHT ARM Performed at [facility], [address], [place], [place] [address]    Special Requests   Final    BOTTLES DRAWN AEROBIC AND ANAEROBIC Blood Culture adequate volume Performed at [facility], [address], [place], [place] [address]    Culture   Final    NO GROWTH 5 DAYS Performed at [facility], [address] [place], [place] [address]    Report Status 05/08/2023 FINAL  Final  Blood culture (routine x 2)     Status: None   Collection Time: 05/02/23  8:39 PM   Specimen: BLOOD RIGHT ARM  Result Value Ref Range Status   Specimen Description   Final    BLOOD RIGHT ARM Performed at [facility], [address] [place], [place] [address]    Special Requests   Final    BOTTLES DRAWN AEROBIC ONLY Blood Culture adequate volume Performed at [facility], [address] [place], [place] [address]    Culture   Final    NO GROWTH 5 DAYS Performed at [facility], [address], [place], [place] [address]    Report Status 05/08/2023 FINAL  Final  Surgical pcr screen     Status: None   Collection Time: 05/07/23  8:23 PM   Specimen: Nasal Mucosa; Nasal Swab  Result Value Ref Range Status   MRSA, PCR NEGATIVE NEGATIVE Final   Staphylococcus aureus NEGATIVE NEGATIVE Final    Comment: (NOTE) The Xpert SA Assay (FDA approved for NASAL specimens in patients 21 years of age and older), is one component of a comprehensive surveillance program. It is not intended to diagnose infection nor to guide or monitor treatment. Performed at [facility], [address] [place], [place] [address]     Antimicrobials: Anti-infectives (From admission, onward)    Start     Dose/Rate Route Frequency Ordered Stop   05/08/23 1530  cefOXitin (MEFOXIN) 2 g in sodium chloride 0.9 % 100 mL IVPB        2 g 200 mL/hr over 30 Minutes Intravenous STAT 05/08/23 1519 05/09/23 1530   05/03/23  1000   piperacillin-tazobactam (ZOSYN) IVPB 3.375 g  Status:  Discontinued        3.375 g 12.5 mL/hr over 240 Minutes Intravenous Every 8 hours 05/02/23 2213 05/04/23 1040   05/02/23 2200  oseltamivir (TAMIFLU) capsule 30 mg        30 mg Oral 2 times daily 05/02/23 2131 05/07/23 0845   05/02/23 1800  piperacillin-tazobactam (ZOSYN) IVPB 3.375 g        3.375 g 12.5 mL/hr over 240 Minutes Intravenous  Once 05/02/23 1759 05/03/23 0542      Culture/Microbiology    Component Value Date/Time   SDES  05/02/2023 2039    BLOOD RIGHT ARM Performed at [facility], [address], [place], [place] [address]    SPECREQUEST  05/02/2023 2039    BOTTLES DRAWN AEROBIC ONLY Blood Culture adequate volume Performed at [facility], [address] [place], [place] [address]    CULT  05/02/2023 2039    NO GROWTH 5 DAYS Performed at [facility], [address] [place], [place] [address]    REPTSTATUS 05/08/2023 FINAL 05/02/2023 2039    Radiology Studies: DG Abd Portable 1V  Result Date: 05/08/2023 CLINICAL DATA:  Small bowel obstruction. EXAM: PORTABLE ABDOMEN - 1 VIEW COMPARISON:  Radiograph yesterday. FINDINGS: Progressive enteric contrast in the colon. There is persistent gaseous small bowel in the central and right abdomen, small bowel distention up to 5 cm. Tip of the enteric tube below the diaphragm in the stomach. Right upper quadrant surgical clips. IMPRESSION: Persistent gaseous small bowel distention up to 5 cm. Progressive enteric contrast in the colon. Electronically Signed   By: Narda Rutherford M.D.   On: 05/08/2023 08:53     LOS: 7 days   Lanae Boast, MD [facility]  05/09/2023, 11:32 AM

## 2023-05-09 NOTE — Progress Notes (Signed)
Mobility Specialist - Progress Note   05/09/23 0910  Mobility  Activity Ambulated with assistance to bathroom  Level of Assistance Standby assist, set-up cues, supervision of patient - no hands on  Assistive Device Front wheel walker  Distance Ambulated (ft) 20 ft  Activity Response Tolerated well  Mobility Referral Yes  $Mobility charge 1 Mobility  Mobility Specialist Start Time (ACUTE ONLY) 0844  Mobility Specialist Stop Time (ACUTE ONLY) 0910  Mobility Specialist Time Calculation (min) (ACUTE ONLY) 26 min   Pt received in bed and agreeable to mobility. Prior to ambulating, pt requested assistance to bathroom for BM. Once standing pt c/o feeling dizzy. Suggested ambulating this afternoon. Pt to recliner after session with all needs met.    [facility]

## 2023-05-10 DIAGNOSIS — K56609 Unspecified intestinal obstruction, unspecified as to partial versus complete obstruction: Secondary | ICD-10-CM | POA: Diagnosis not present

## 2023-05-10 LAB — BASIC METABOLIC PANEL
Anion gap: 3 — ABNORMAL LOW (ref 5–15)
BUN: 13 mg/dL (ref 8–23)
CO2: 27 mmol/L (ref 22–32)
Calcium: 7.9 mg/dL — ABNORMAL LOW (ref 8.9–10.3)
Chloride: 110 mmol/L (ref 98–111)
Creatinine, Ser: 0.61 mg/dL (ref 0.44–1.00)
GFR, Estimated: >60 mL/min (ref >60)
Glucose, Bld: 146 mg/dL — ABNORMAL HIGH (ref 70–99)
Potassium: 3.8 mmol/L (ref 3.5–5.1)
Sodium: 140 mmol/L (ref 135–145)

## 2023-05-10 LAB — CBC
HCT: 32 % — ABNORMAL LOW (ref 36.0–46.0)
Hemoglobin: 10.1 g/dL — ABNORMAL LOW (ref 12.0–15.0)
MCH: 29.5 pg (ref 26.0–34.0)
MCHC: 31.6 g/dL (ref 30.0–36.0)
MCV: 93.6 fL (ref 80.0–100.0)
Platelets: 145 10*3/uL — ABNORMAL LOW (ref 150–400)
RBC: 3.42 MIL/uL — ABNORMAL LOW (ref 3.87–5.11)
RDW: 14.2 % (ref 11.5–15.5)
WBC: 8 10*3/uL (ref 4.0–10.5)
nRBC: 0 % (ref 0.0–0.2)

## 2023-05-10 LAB — GLUCOSE, CAPILLARY
Glucose-Capillary: 131 mg/dL — ABNORMAL HIGH (ref 70–99)
Glucose-Capillary: 136 mg/dL — ABNORMAL HIGH (ref 70–99)
Glucose-Capillary: 157 mg/dL — ABNORMAL HIGH (ref 70–99)
Glucose-Capillary: 167 mg/dL — ABNORMAL HIGH (ref 70–99)

## 2023-05-10 NOTE — Progress Notes (Signed)
2 Days Post-Op   Subjective/Chief Complaint: Pt feels ok No flatus or BM Minimal NGT output    Objective: Vital signs in last 24 hours: Temp:  [97.6 F (36.4 C)-99.4 F (37.4 C)] 98.1 F (36.7 C) (09/22 0902) Pulse Rate:  [101-107] 107 (09/22 0902) Resp:  [15-18] 18 (09/22 0902) BP: (109-157)/(60-87) 140/74 (09/22 0902) SpO2:  [91 %-92 %] 91 % (09/22 0902) Last BM Date : 05/09/23  Intake/Output from previous day: 09/21 0701 - 09/22 0700 In: 2501.6 [P.O.:120; I.V.:2351.6; NG/GT:30] Out: 950 [Urine:900; Emesis/NG output:50] Intake/Output this shift: No intake/output data recorded.  Incision CDI honeycomb in place  ND sore around the incision   Lab Results:  Recent Labs    05/09/23 0458 05/10/23 0527  WBC 8.8 8.0  HGB 11.5* 10.1*  HCT 35.3* 32.0*  PLT 141* 145*   BMET Recent Labs    05/09/23 0458 05/10/23 0527  NA 137 140  K 3.7 3.8  CL 104 110  CO2 27 27  GLUCOSE 201* 146*  BUN 12 13  CREATININE 0.63 0.61  CALCIUM 8.0* 7.9*   PT/INR No results for input(s): "LABPROT", "INR" in the last 72 hours. ABG No results for input(s): "PHART", "HCO3" in the last 72 hours.  Invalid input(s): "PCO2", "PO2"  Studies/Results: No results found.  Anti-infectives: Anti-infectives (From admission, onward)    Start     Dose/Rate Route Frequency Ordered Stop   05/08/23 1530  cefOXitin (MEFOXIN) 2 g in sodium chloride 0.9 % 100 mL IVPB        2 g 200 mL/hr over 30 Minutes Intravenous STAT 05/08/23 1519 05/09/23 1530   05/03/23 1000  piperacillin-tazobactam (ZOSYN) IVPB 3.375 g  Status:  Discontinued        3.375 g 12.5 mL/hr over 240 Minutes Intravenous Every 8 hours 05/02/23 2213 05/04/23 1040   05/02/23 2200  oseltamivir (TAMIFLU) capsule 30 mg        30 mg Oral 2 times daily 05/02/23 2131 05/07/23 0845   05/02/23 1800  piperacillin-tazobactam (ZOSYN) IVPB 3.375 g        3.375 g 12.5 mL/hr over 240 Minutes Intravenous  Once 05/02/23 1759 05/03/23 0542        Assessment/Plan: s/p Procedure(s): EXPLORATORY LAPAROTOMY WITH EXTENSIVE LYSIS OF ADHESSIONS (N/A) D/C NGT D/C FOLEY CLEAR LIQUID DIET  OOB PT/OT   LOS: 8 days    Patricia Peters Fus A Patricia Peters 05/10/2023

## 2023-05-10 NOTE — Progress Notes (Signed)
Physical Therapy Treatment Patient Details Name: Patricia Peters MRN: [id number] DOB: August 04, 1936 Today's Date: 05/10/2023   History of Present Illness Patient is a 87 year old female admitted with SBO. Imaging: appendicitis vs cecal mass per general surgery. Flu (+). S/P ex lap 05/08/23. Hx of choecystectomy, AAA, mild memory impairment    PT Comments  Pt agreeable to ambulating again. CGA-Min A for mobility. Abdominal pain with activity and coughing. Encouraged pt to practice spirometer throughout the day-she still require cues for how to properly use it. Will continue to progress activity as pt is able to tolerate. Will need to maximize home health services (HHPT, HHOT, Home Health Aide).     If plan is discharge home, recommend the following: A little help with walking and/or transfers;A little help with bathing/dressing/bathroom;Help with stairs or ramp for entrance   Can travel by private vehicle        Equipment Recommendations  Rolling walker (2 wheels)    Recommendations for Other Services       Precautions / Restrictions Precautions Precautions: Fall Precaution Comments: NG tube Restrictions Weight Bearing Restrictions: No     Mobility  Bed Mobility Overal bed mobility: Needs Assistance Bed Mobility: Sit to Supine       Sit to supine: Contact guard assist   General bed mobility comments: oob in recliner    Transfers Overall transfer level: Needs assistance Equipment used: Rolling walker (2 wheels) Transfers: Sit to/from Stand Sit to Stand: Contact guard assist           General transfer comment: CGA for safety. Cues for safety, technique, hand placement. Increased time.    Ambulation/Gait Ambulation/Gait assistance: Min assist Gait Distance (Feet): 150 Feet Assistive device: Rolling walker (2 wheels) Gait Pattern/deviations: Step-through pattern, Decreased stride length       General Gait Details: Intermittent assist to steady. Cues for safety,  proper operation of RW. Tolerated distance well.   Stairs             Wheelchair Mobility     Tilt Bed    Modified Rankin (Stroke Patients Only)       Balance Overall balance assessment: Needs assistance         Standing balance support: Bilateral upper extremity supported, Reliant on assistive device for balance, During functional activity Standing balance-Leahy Scale: Fair                              Cognition Arousal: Alert Behavior During Therapy: WFL for tasks assessed/performed Overall Cognitive Status: History of cognitive impairments - at baseline Area of Impairment: Problem solving, Memory                     Memory: Decreased short-term memory       Problem Solving: Requires verbal cues General Comments: seemed a bit drowsy on today. had trouble following commands for using spirometer        Exercises      General Comments        Pertinent Vitals/Pain Pain Assessment Pain Assessment: Faces Faces Pain Scale: Hurts even more Pain Location: abdomen Pain Descriptors / Indicators: Discomfort, Grimacing Pain Intervention(s): Limited activity within patient's tolerance, Monitored during session, Repositioned    Home Living                          Prior Function  PT Goals (current goals can now be found in the care plan section) Progress towards PT goals: Progressing toward goals    Frequency    Min 1X/week      PT Plan      Co-evaluation              AM-PAC PT "6 Clicks" Mobility   Outcome Measure  Help needed turning from your back to your side while in a flat bed without using bedrails?: A Little Help needed moving from lying on your back to sitting on the side of a flat bed without using bedrails?: A Little Help needed moving to and from a bed to a chair (including a wheelchair)?: A Little Help needed standing up from a chair using your arms (e.g., wheelchair or bedside  chair)?: A Little Help needed to walk in hospital room?: A Little Help needed climbing 3-5 steps with a railing? : A Lot 6 Click Score: 17    End of Session Equipment Utilized During Treatment: Gait belt Activity Tolerance: Patient tolerated treatment well;Patient limited by pain Patient left: in bed;with call bell/phone within reach;with bed alarm set;with family/visitor present   PT Visit Diagnosis: Muscle weakness (generalized) (M62.81);Difficulty in walking, not elsewhere classified (R26.2)     Time: 8413-2440 PT Time Calculation (min) (ACUTE ONLY): 23 min  Charges:    $Gait Training: 23-37 mins PT General Charges $$ ACUTE PT VISIT: 1 Visit                         Faye Ramsay, PT Acute Rehabilitation  Office: [phone]

## 2023-05-10 NOTE — Progress Notes (Signed)
PROGRESS NOTE Patricia Peters  ZOX:[id number] DOB: [birth year] DOA: 05/02/2023 PCP: Ollen Bowl, MD  Brief Narrative/Hospital Course: Patricia Peters is a 87 y.o. female with a history of cholecystectomy.  Patient presented secondary to extreme fatigue, upper abdominal pain, nausea, vomiting and diarrhea. Patient was found to have evidence of influenza A infection in addition to SBO with transition point. General surgery consulted with recommendation for conservative management. NG tube placed. Patient started on oseltamivir for influenza A treatment.Patient followed by general surgery.  Patient overall has failed to improve with ongoing abdominal distention. Respiratory status has remained stable although cough, chest x-ray has been clear no pneumonia, completed Tamiflu. 9/20: Underwent ex laparotomy with extensive lysis of adhesion    Subjective: Patient seen and examined this morning No flatus or bowel movement per nursing staff, Intermittently confused Overnight patient has been afebrile BP stable labs with improving platelet count hemoglobin with some drop at 10.1 g  Last BM 9/21  Assessment and Plan: Principal Problem:   SBO (small bowel obstruction) (HCC) Active Problems:   Anxiety   Influenza due to identified novel influenza A virus with other respiratory manifestations   Infrarenal abdominal aortic aneurysm (AAA) without rupture (HCC)   Hyperglycemia   Dehydration   Hypoxia   Thrombocytopenia (HCC)   CKD (chronic kidney disease) stage 2, GFR 60-89 ml/min   Memory changes  SBO: Did not improve on conservative management  so underwent ex laparotomy with extensive LOA 9/21 Cc following, NGT in place planning for NG clamp and removal Foley removal clear liquid diet, encourage OOB  Continue to mobilize, continue IV fluids pain Control, IS   Enhancement/inflammation along the medial cecum without discrete/visualize appendix Small to moderate abdominopelvic ascites: As per  radiology and surgery concerned about a mass in her cecum that may be causing partial obstruction versus being a separate finding.[facility] was consulted for colonoscopy and agreeable to do if SBO resolves.CEA within normal limit.   Influenza A infection, onset 9/12 Thursday: No Pneumonia.procal Negative,  some cough only s/p Tamiflu. Cont nebs prn/scheduled.   Abnormal urinalysis-negative urine culture. Likely asymptomatic bacteruria.No treatment needed.   Thrombocytopenia: From acute viral illness, improving    Hypokalemia: Resolved. On ivf w/ kcl Recent Labs  Lab 05/06/23 0500 05/07/23 0512 05/08/23 0449 05/09/23 0458 05/10/23 0527  K 3.1* 3.1* 3.2* 3.7 3.8  CALCIUM 8.4* 8.2* 8.1* 8.0* 7.9*  MG 2.0  --   --   --   --    Hyperglycemia Prediabetes HbA1c 6.4: Stable c blood sugar, will need to follow-up with PCP and work on diet modification   Dehydration Resolved with IV fluids.     Bilateral pleural effusions mild: Incidentally noted on CT scan, Monitor not hypoxic   Abdominal aortic aneurysm Incidental finding noted in the CT abdomen on admission-Aneurysm is infrarenal and measures 2.8 cm. Recommendation for outpatient vascular surgery follow-up-this was inforemed to the patient and family.  DVT prophylaxis: Place and maintain sequential compression device Start: 05/02/23 2129 Code Status:   Code Status: Full Code Family Communication: plan of care discussed with patient/ her daughter at bedside. Patient status is: Inpatient because of SBO Level of care: Med-Surg   Dispo: The patient is from: home            Anticipated disposition: TBD Objective: Vitals last 24 hrs: Vitals:   05/09/23 1606 05/09/23 2117 05/10/23 0555 05/10/23 0902  BP: 109/62 119/74 (!) 157/87 (!) 140/74  Pulse: (!) 101 (!) 103 (!) 105 Marland Kitchen)  107  Resp: 18 15 18 18   Temp: 98.4 F (36.9 C) 99.4 F (37.4 C) 98.6 F (37 C) 98.1 F (36.7 C)  TempSrc: Oral Oral Oral Oral  SpO2:  92% 92% 91%   Weight:      Height:       Weight change:   Physical Examination: General exam: alert awake, oriented  HEENT:Oral mucosa moist, Ear/Nose WNL grossly Respiratory system: Bilaterally clear BS,no use of accessory muscle Cardiovascular system: S1 & S2 +, No JVD. Gastrointestinal system: Abdomen soft, surgical site with honeycomb dressing in place mildly tender BS present Nervous System: Alert, awake, moving all extremities,and following commands. Extremities: LE edema neg,distal peripheral pulses palpable and warm.  Skin: No rashes,no icterus. MSK: Normal muscle bulk,tone, power   medications reviewed:  Scheduled Meds:  bisacodyl  10 mg Rectal Daily   Chlorhexidine Gluconate Cloth  6 each Topical Daily   lidocaine  1 patch Transdermal Q24H   pantoprazole (PROTONIX) IV  40 mg Intravenous Q24H  Continuous Infusions:  dextrose 5 % and 0.9 % NaCl with KCl 20 mEq/L 50 mL/hr at 05/10/23 0815   methocarbamol (ROBAXIN) IV     ondansetron (ZOFRAN) IV      Diet Order             Diet clear liquid Fluid consistency: Thin  Diet effective now                    Intake/Output Summary (Last 24 hours) at 05/10/2023 1155 Last data filed at 05/10/2023 1129 Gross per 24 hour  Intake 2023.24 ml  Output 1450 ml  Net 573.24 ml   Net IO Since Admission: 5,829.99 mL [05/10/23 1155]  Wt Readings from Last 3 Encounters:  05/08/23 68.9 kg  04/06/17 82.6 kg  03/28/17 82.1 kg     Unresulted Labs (From admission, onward)     Start     Ordered   05/07/23 0500  Basic metabolic panel  Daily,   R     Question:  Specimen collection method  Answer:  Lab=Lab collect   05/06/23 1115   05/07/23 0500  CBC  Daily,   R     Question:  Specimen collection method  Answer:  Lab=Lab collect   05/06/23 1115          Data Reviewed: I have personally reviewed following labs and imaging studies CBC: Recent Labs  Lab 05/06/23 0500 05/07/23 0512 05/08/23 0449 05/09/23 0458 05/10/23 0527  WBC 6.1  7.3 7.3 8.8 8.0  HGB 11.8* 11.8* 11.4* 11.5* 10.1*  HCT 38.3 36.5 35.0* 35.3* 32.0*  MCV 95.5 93.4 91.4 92.9 93.6  PLT 96* 100* 114* 141* 145*   Basic Metabolic Panel: Recent Labs  Lab 05/06/23 0500 05/07/23 0512 05/08/23 0449 05/09/23 0458 05/10/23 0527  NA 142 139 135 137 140  K 3.1* 3.1* 3.2* 3.7 3.8  CL 106 101 98 104 110  CO2 24 24 28 27 27   GLUCOSE 85 75 152* 201* 146*  BUN 24* 17 11 12 13   CREATININE 0.69 0.59 0.56 0.63 0.61  CALCIUM 8.4* 8.2* 8.1* 8.0* 7.9*  MG 2.0  --   --   --   --    GFR: Estimated Creatinine Clearance: 48.1 mL/min (by C-G formula based on SCr of 0.61 mg/dL). Liver Function Tests: Recent Labs  Lab 05/06/23 0500  AST 30  ALT 42  ALKPHOS 36*  BILITOT 1.2  PROT 5.5*  ALBUMIN 3.0*   No results for  input(s): "LIPASE", "AMYLASE" in the last 168 hours. CBG: Recent Labs  Lab 05/09/23 0554 05/09/23 1129 05/09/23 2338 05/10/23 0559 05/10/23 1128  GLUCAP 198* 179* 185* 157* 167*   Recent Labs  Lab 05/07/23 0512  PROCALCITON <0.10    Recent Results (from the past 240 hour(s))  Resp panel by RT-PCR (RSV, Flu A&B, Covid) Anterior Nasal Swab     Status: Abnormal   Collection Time: 05/02/23  3:48 PM   Specimen: Anterior Nasal Swab  Result Value Ref Range Status   SARS Coronavirus 2 by RT PCR NEGATIVE NEGATIVE Final    Comment: (NOTE) SARS-CoV-2 target nucleic acids are NOT DETECTED.  The SARS-CoV-2 RNA is generally detectable in upper respiratory specimens during the acute phase of infection. The lowest concentration of SARS-CoV-2 viral copies this assay can detect is 138 copies/mL. A negative result does not preclude SARS-Cov-2 infection and should not be used as the sole basis for treatment or other patient management decisions. A negative result may occur with  improper specimen collection/handling, submission of specimen other than nasopharyngeal swab, presence of viral mutation(s) within the areas targeted by this assay, and  inadequate number of viral copies(<138 copies/mL). A negative result must be combined with clinical observations, patient history, and epidemiological information. The expected result is Negative.  Fact Sheet for Patients:  BloggerCourse.com  Fact Sheet for Healthcare Providers:  SeriousBroker.it  This test is no t yet approved or cleared by the [place] FDA and  has been authorized for detection and/or diagnosis of SARS-CoV-2 by FDA under an Emergency Use Authorization (EUA). This EUA will remain  in effect (meaning this test can be used) for the duration of the COVID-19 declaration under Section 564(b)(1) of the Act, 21 U.S.C.section 360bbb-3(b)(1), unless the authorization is terminated  or revoked sooner.       Influenza A by PCR POSITIVE (A) NEGATIVE Final   Influenza B by PCR NEGATIVE NEGATIVE Final    Comment: (NOTE) The Xpert Xpress SARS-CoV-2/FLU/RSV plus assay is intended as an aid in the diagnosis of influenza from Nasopharyngeal swab specimens and should not be used as a sole basis for treatment. Nasal washings and aspirates are unacceptable for Xpert Xpress SARS-CoV-2/FLU/RSV testing.  Fact Sheet for Patients: BloggerCourse.com  Fact Sheet for Healthcare Providers: SeriousBroker.it  This test is not yet approved or cleared by the [place] FDA and has been authorized for detection and/or diagnosis of SARS-CoV-2 by FDA under an Emergency Use Authorization (EUA). This EUA will remain in effect (meaning this test can be used) for the duration of the COVID-19 declaration under Section 564(b)(1) of the Act, 21 U.S.C. section 360bbb-3(b)(1), unless the authorization is terminated or revoked.     Resp Syncytial Virus by PCR NEGATIVE NEGATIVE Final    Comment: (NOTE) Fact Sheet for Patients: BloggerCourse.com  Fact Sheet for  Healthcare Providers: SeriousBroker.it  This test is not yet approved or cleared by the [place] FDA and has been authorized for detection and/or diagnosis of SARS-CoV-2 by FDA under an Emergency Use Authorization (EUA). This EUA will remain in effect (meaning this test can be used) for the duration of the COVID-19 declaration under Section 564(b)(1) of the Act, 21 U.S.C. section 360bbb-3(b)(1), unless the authorization is terminated or revoked.  Performed at [facility], [address], [place], [place] [address]   Urine Culture     Status: Abnormal   Collection Time: 05/02/23  6:05 PM   Specimen: Urine, Clean Catch  Result Value Ref Range  Status   Specimen Description   Final    URINE, CLEAN CATCH Performed at [facility], [address], [place], [place] [address]    Special Requests   Final    NONE Performed at [facility], [address] [place], [place] [address]    Culture MULTIPLE SPECIES PRESENT, SUGGEST RECOLLECTION (A)  Final   Report Status 05/03/2023 FINAL  Final  Blood culture (routine x 2)     Status: None   Collection Time: 05/02/23  8:30 PM   Specimen: BLOOD RIGHT ARM  Result Value Ref Range Status   Specimen Description   Final    BLOOD RIGHT ARM Performed at [facility], [address] [place], [place] [address]    Special Requests   Final    BOTTLES DRAWN AEROBIC AND ANAEROBIC Blood Culture adequate volume Performed at [facility], [address], [place], [place] [address]    Culture   Final    NO GROWTH 5 DAYS Performed at [facility], [address] [place], [place] [address]    Report Status 05/08/2023 FINAL  Final  Blood culture (routine x 2)     Status: None   Collection Time: 05/02/23  8:39 PM   Specimen: BLOOD RIGHT ARM  Result Value Ref Range Status   Specimen Description   Final    BLOOD RIGHT ARM Performed  at [facility], [address] [place], [place] [address]    Special Requests   Final    BOTTLES DRAWN AEROBIC ONLY Blood Culture adequate volume Performed at [facility], [address], [place], [place] [address]    Culture   Final    NO GROWTH 5 DAYS Performed at [facility], [address] [place], [place] [address]    Report Status 05/08/2023 FINAL  Final  Surgical pcr screen     Status: None   Collection Time: 05/07/23  8:23 PM   Specimen: Nasal Mucosa; Nasal Swab  Result Value Ref Range Status   MRSA, PCR NEGATIVE NEGATIVE Final   Staphylococcus aureus NEGATIVE NEGATIVE Final    Comment: (NOTE) The Xpert SA Assay (FDA approved for NASAL specimens in patients 70 years of age and older), is one component of a comprehensive surveillance program. It is not intended to diagnose infection nor to guide or monitor treatment. Performed at [facility], [address] [place], [place] [address]     Antimicrobials: Anti-infectives (From admission, onward)    Start     Dose/Rate Route Frequency Ordered Stop   05/08/23 1530  cefOXitin (MEFOXIN) 2 g in sodium chloride 0.9 % 100 mL IVPB        2 g 200 mL/hr over 30 Minutes Intravenous STAT 05/08/23 1519 05/09/23 1530   05/03/23 1000  piperacillin-tazobactam (ZOSYN) IVPB 3.375 g  Status:  Discontinued        3.375 g 12.5 mL/hr over 240 Minutes Intravenous Every 8 hours 05/02/23 2213 05/04/23 1040   05/02/23 2200  oseltamivir (TAMIFLU) capsule 30 mg        30 mg Oral 2 times daily 05/02/23 2131 05/07/23 0845   05/02/23 1800  piperacillin-tazobactam (ZOSYN) IVPB 3.375 g        3.375 g 12.5 mL/hr over 240 Minutes Intravenous  Once 05/02/23 1759 05/03/23 0542      Culture/Microbiology    Component Value Date/Time   SDES  05/02/2023 2039    BLOOD  RIGHT ARM Performed at [facility], [address] [place], [place] [address]    SPECREQUEST  05/02/2023 2039    BOTTLES  DRAWN AEROBIC ONLY Blood Culture adequate volume Performed at [facility], [address] [place], [place] [address]    CULT  05/02/2023 2039    NO GROWTH 5 DAYS Performed at [facility], [address] [place], [place] [address]    REPTSTATUS 05/08/2023 FINAL 05/02/2023 2039    Radiology Studies: No results found.   LOS: 8 days   Lanae Boast, MD [facility]  05/10/2023, 11:55 AM

## 2023-05-10 NOTE — Progress Notes (Signed)
Pt has taken sm amts of clear liqs today and tol well. However pt states she wants to leave NG tube in for now (clamped) "just to make sure don't get sick again." Also noted that pt cont w very congested cough and that pt coughs esp when taking po's - ? Aspiration. Enc pt to only take po's when 90 degrees upright and to take sm, slow sips. O2 sats remain >90% on RA

## 2023-05-10 NOTE — Progress Notes (Signed)
Physical Therapy Re-Evaluation Patient Details Name: Patricia Peters MRN: [id number] DOB: 07-30-1936 Today's Date: 05/10/2023   History of Present Illness Patient is a 87 year old female admitted with SBO. Imaging: appendicitis vs cecal mass per general surgery. Flu (+). S/P ex lap 05/08/23. Hx of choecystectomy, AAA, mild memory impairment    PT Comments  Re-eval s/p ex lap 05/08/2023. Pt agreeable to working with therapy. She appeared a bit drowsy. O2 90% on RA. Some abdominal pain with activity. She was able to walk a short distance in the hallway with use of a RW. She seems to have some confusion-she had difficulty using spirometer during session, even with cueing. Pt reports her daughter is no longer in town. Recommend maximized home health services at this time (continuing to assess). If pt does not improve, may need to consider ST SNF.     If plan is discharge home, recommend the following: A little help with walking and/or transfers;A little help with bathing/dressing/bathroom;Help with stairs or ramp for entrance   Can travel by private vehicle        Equipment Recommendations  Rolling walker (2 wheels)    Recommendations for Other Services       Precautions / Restrictions Precautions Precautions: Fall Precaution Comments: NG tube Restrictions Weight Bearing Restrictions: No     Mobility  Bed Mobility               General bed mobility comments: oob in recliner    Transfers Overall transfer level: Needs assistance Equipment used: Rolling walker (2 wheels) Transfers: Sit to/from Stand Sit to Stand: Contact guard assist           General transfer comment: CGA for safety. Cues for safety, technique, hand placement. Increased time.    Ambulation/Gait Ambulation/Gait assistance: Min assist Gait Distance (Feet): 65 Feet Assistive device: Rolling walker (2 wheels) Gait Pattern/deviations: Step-through pattern, Decreased stride length, Trunk flexed        General Gait Details: Intermittent assist to steady. Cues for safety, proper operation of RW. Reports some mild dizziness.   Stairs             Wheelchair Mobility     Tilt Bed    Modified Rankin (Stroke Patients Only)       Balance Overall balance assessment: Needs assistance         Standing balance support: Bilateral upper extremity supported, Reliant on assistive device for balance, During functional activity Standing balance-Leahy Scale: Fair                              Cognition Arousal: Alert Behavior During Therapy: WFL for tasks assessed/performed   Area of Impairment: Problem solving                             Problem Solving: Requires verbal cues General Comments: seemed a bit drowsy on today. had trouble following commands for using spirometer        Exercises      General Comments        Pertinent Vitals/Pain Pain Assessment Pain Assessment: Faces Faces Pain Scale: Hurts even more Pain Location: abdomen Pain Descriptors / Indicators: Discomfort, Grimacing Pain Intervention(s): Limited activity within patient's tolerance, Monitored during session, Repositioned    Home Living  Prior Function            PT Goals (current goals can now be found in the care plan section) Progress towards PT goals: Progressing toward goals    Frequency    Min 1X/week      PT Plan      Co-evaluation              AM-PAC PT "6 Clicks" Mobility   Outcome Measure  Help needed turning from your back to your side while in a flat bed without using bedrails?: A Little Help needed moving from lying on your back to sitting on the side of a flat bed without using bedrails?: A Little Help needed moving to and from a bed to a chair (including a wheelchair)?: A Little Help needed standing up from a chair using your arms (e.g., wheelchair or bedside chair)?: A Little Help needed to walk in  hospital room?: A Little Help needed climbing 3-5 steps with a railing? : A Lot 6 Click Score: 17    End of Session Equipment Utilized During Treatment: Gait belt Activity Tolerance: Patient tolerated treatment well;Patient limited by pain Patient left: in chair;with call bell/phone within reach;with family/visitor present   PT Visit Diagnosis: Muscle weakness (generalized) (M62.81);Difficulty in walking, not elsewhere classified (R26.2)     Time: 6213-0865 PT Time Calculation (min) (ACUTE ONLY): 16 min  Charges:      PT General Charges $$ ACUTE PT VISIT: 1 Visit                         Faye Ramsay, PT Acute Rehabilitation  Office: [phone]

## 2023-05-10 NOTE — Plan of Care (Signed)
Problem: Education: Goal: Knowledge of General Education information will improve Description: Including pain rating scale, medication(s)/side effects and non-pharmacologic comfort measures Outcome: Progressing   Problem: Clinical Measurements: Goal: Will remain free from infection Outcome: Progressing

## 2023-05-11 DIAGNOSIS — K56609 Unspecified intestinal obstruction, unspecified as to partial versus complete obstruction: Secondary | ICD-10-CM | POA: Diagnosis not present

## 2023-05-11 LAB — BASIC METABOLIC PANEL WITH GFR
Anion gap: 7 (ref 5–15)
BUN: 11 mg/dL (ref 8–23)
CO2: 28 mmol/L (ref 22–32)
Calcium: 8.3 mg/dL — ABNORMAL LOW (ref 8.9–10.3)
Chloride: 105 mmol/L (ref 98–111)
Creatinine, Ser: 0.59 mg/dL (ref 0.44–1.00)
GFR, Estimated: >60 mL/min
Glucose, Bld: 140 mg/dL — ABNORMAL HIGH (ref 70–99)
Potassium: 3.7 mmol/L (ref 3.5–5.1)
Sodium: 140 mmol/L (ref 135–145)

## 2023-05-11 LAB — CBC
HCT: 31.8 % — ABNORMAL LOW (ref 36.0–46.0)
Hemoglobin: 10 g/dL — ABNORMAL LOW (ref 12.0–15.0)
MCH: 29.5 pg (ref 26.0–34.0)
MCHC: 31.4 g/dL (ref 30.0–36.0)
MCV: 93.8 fL (ref 80.0–100.0)
Platelets: 144 10*3/uL — ABNORMAL LOW (ref 150–400)
RBC: 3.39 MIL/uL — ABNORMAL LOW (ref 3.87–5.11)
RDW: 14.1 % (ref 11.5–15.5)
WBC: 8.6 10*3/uL (ref 4.0–10.5)
nRBC: 0 % (ref 0.0–0.2)

## 2023-05-11 LAB — GLUCOSE, CAPILLARY
Glucose-Capillary: 156 mg/dL — ABNORMAL HIGH (ref 70–99)
Glucose-Capillary: 124 mg/dL — ABNORMAL HIGH (ref 70–99)
Glucose-Capillary: 114 mg/dL — ABNORMAL HIGH (ref 70–99)

## 2023-05-11 MED ORDER — ENSURE ENLIVE PO LIQD
237.0000 mL | Freq: Two times a day (BID) | ORAL | Status: DC
  Administered 2023-05-11: 237 mL via ORAL

## 2023-05-11 MED ORDER — ACETAMINOPHEN 325 MG PO TABS: 650.0000 mg | ORAL_TABLET | Freq: Four times a day (QID) | ORAL | Status: DC | PRN

## 2023-05-11 MED ORDER — ENOXAPARIN SODIUM 40 MG/0.4ML IJ SOSY
40.0000 mg | PREFILLED_SYRINGE | INTRAMUSCULAR | Status: DC
  Administered 2023-05-11 – 2023-05-14 (×4): 40 mg via SUBCUTANEOUS
  Filled 2023-05-11 (×4): qty 0.4

## 2023-05-11 NOTE — Progress Notes (Signed)
PROGRESS NOTE Patricia Peters  IHK:[id number] DOB: 1936/02/08 DOA: 05/02/2023 PCP: Ollen Bowl, MD  Brief Narrative/Hospital Course: Patricia Peters is a 87 y.o. female with a history of cholecystectomy.  Patient presented secondary to extreme fatigue, upper abdominal pain, nausea, vomiting and diarrhea. Patient was found to have evidence of influenza A infection in addition to SBO with transition point. General surgery consulted with recommendation for conservative management. NG tube placed. Patient started on oseltamivir for influenza A treatment.Patient followed by general surgery.  Patient overall has failed to improve with ongoing abdominal distention. Respiratory status has remained stable although cough, chest x-ray has been clear no pneumonia, completed Tamiflu. 9/20: Underwent ex laparotomy with extensive lysis of adhesion    Subjective: Seen and examined Overnight afebrile , BP 140s to 150s, on room air  Labs reviewed stable renal function, CBC  NGT clamp in place  Assessment and Plan: Principal Problem:   SBO (small bowel obstruction) (HCC) Active Problems:   Anxiety   Influenza due to identified novel influenza A virus with other respiratory manifestations   Infrarenal abdominal aortic aneurysm (AAA) without rupture (HCC)   Hyperglycemia   Dehydration   Hypoxia   Thrombocytopenia (HCC)   CKD (chronic kidney disease) stage 2, GFR 60-89 ml/min   Memory changes  SBO s/p ex laparotomy with extensive LOA 9/21 : Did not improve on conservative management  sop underwent surgery. CCS following, NGT in clamp-surgical planning for removal today.  Continue OOB, full liquid diet and advance as tolerated per surgery continue PT OT incentive spirometry pain management, weaning of IV fluids   Enhancement/inflammation along the medial cecum without discrete/visualize appendix Small to moderate abdominopelvic ascites: As per radiology and surgery concerned about a mass in her cecum  that may be causing partial obstruction versus being a separate finding.[facility] was consulted for colonoscopy and agreeable to do if SBO resolves.CEA within normal limit.  Defer to surgery/GI.  Influenza A infection, onset 9/12 Thursday: No Pneumonia.procal Negative,  some cough only s/p Tamiflu. Cont nebs prn/scheduled.   Abnormal urinalysis-negative urine culture. Likely asymptomatic bacteruria.No treatment needed.   Thrombocytopenia: From acute viral illness, platelet counts improving.   Hypokalemia: Resolved on gentle potassium on IVF.    Hyperglycemia Prediabetes HbA1c 6.4: Stable c blood sugar, will need to follow-up with PCP and work on diet modification   Dehydration Resolved with IV fluids.     Bilateral pleural effusions mild: Incidentally noted on CT scan, Monitor not hypoxic   Abdominal aortic aneurysm Incidental finding noted in the CT abdomen on admission-Aneurysm is infrarenal and measures 2.8 cm. Recommendation for outpatient vascular surgery follow-up-this was inforemed to the patient and family.  DVT prophylaxis: Place and maintain sequential compression device Start: 05/02/23 2129 Code Status:   Code Status: Full Code Family Communication: plan of care discussed with patient/ no family at bedside.    Patient status is: Inpatient because of SBO Level of care: Med-Surg  Dispo: The patient is from: home            Anticipated disposition: PT OT recommending home health once cleared by surgery Objective: Vitals last 24 hrs: Vitals:   05/10/23 1253 05/10/23 1721 05/10/23 2051 05/11/23 0530  BP: 127/67 (!) 154/71 (!) 149/80 (!) 154/74  Pulse: 98 100 (!) 109 92  Resp: 16 18 19 17   Temp: 98 F (36.7 C) (!) 97.5 F (36.4 C) 98.7 F (37.1 C) 98 F (36.7 C)  TempSrc: Oral Oral  Oral  SpO2: 91%  90% 91% 90%  Weight:      Height:       Weight change:   Physical Examination: General exam: alert awake, oriented, NGT+  HEENT:Oral mucosa moist, Ear/Nose WNL  grossly Respiratory system: Bilaterally clear BS,no use of accessory muscle Cardiovascular system: S1 & S2 +, No JVD. Gastrointestinal system: Abdomen soft, honeycomb dressing in place  with tenderness as expected bowel sounds sluggish  Nervous System: Alert, awake, moving all extremities,and following commands. Extremities: LE edema neg,distal peripheral pulses palpable and warm.  Skin: No rashes,no icterus. MSK: Normal muscle bulk,tone, power   medications reviewed:  Scheduled Meds:  bisacodyl  10 mg Rectal Daily   Chlorhexidine Gluconate Cloth  6 each Topical Daily   lidocaine  1 patch Transdermal Q24H   pantoprazole (PROTONIX) IV  40 mg Intravenous Q24H  Continuous Infusions:  dextrose 5 % and 0.9 % NaCl with KCl 20 mEq/L 50 mL/hr at 05/10/23 1700   methocarbamol (ROBAXIN) IV     ondansetron (ZOFRAN) IV      Diet Order             Diet full liquid Room service appropriate? Yes; Fluid consistency: Thin  Diet effective 1000                    Intake/Output Summary (Last 24 hours) at 05/11/2023 1041 Last data filed at 05/11/2023 1000 Gross per 24 hour  Intake 2130.82 ml  Output 1100 ml  Net 1030.82 ml   Net IO Since Admission: 7,570.84 mL [05/11/23 1041]  Wt Readings from Last 3 Encounters:  05/08/23 68.9 kg  04/06/17 82.6 kg  03/28/17 82.1 kg     Unresulted Labs (From admission, onward)    None     Data Reviewed: I have personally reviewed following labs and imaging studies CBC: Recent Labs  Lab 05/07/23 0512 05/08/23 0449 05/09/23 0458 05/10/23 0527 05/11/23 0429  WBC 7.3 7.3 8.8 8.0 8.6  HGB 11.8* 11.4* 11.5* 10.1* 10.0*  HCT 36.5 35.0* 35.3* 32.0* 31.8*  MCV 93.4 91.4 92.9 93.6 93.8  PLT 100* 114* 141* 145* 144*   Basic Metabolic Panel: Recent Labs  Lab 05/06/23 0500 05/07/23 0512 05/08/23 0449 05/09/23 0458 05/10/23 0527 05/11/23 0429  NA 142 139 135 137 140 140  K 3.1* 3.1* 3.2* 3.7 3.8 3.7  CL 106 101 98 104 110 105  CO2 24 24 28 27  27 28   GLUCOSE 85 75 152* 201* 146* 140*  BUN 24* 17 11 12 13 11   CREATININE 0.69 0.59 0.56 0.63 0.61 0.59  CALCIUM 8.4* 8.2* 8.1* 8.0* 7.9* 8.3*  MG 2.0  --   --   --   --   --    GFR: Estimated Creatinine Clearance: 48.1 mL/min (by C-G formula based on SCr of 0.59 mg/dL). Liver Function Tests: Recent Labs  Lab 05/06/23 0500  AST 30  ALT 42  ALKPHOS 36*  BILITOT 1.2  PROT 5.5*  ALBUMIN 3.0*   No results for input(s): "LIPASE", "AMYLASE" in the last 168 hours. CBG: Recent Labs  Lab 05/10/23 0559 05/10/23 1128 05/10/23 1754 05/10/23 2338 05/11/23 0533  GLUCAP 157* 167* 131* 136* 156*   Recent Labs  Lab 05/07/23 0512  PROCALCITON <0.10    Recent Results (from the past 240 hour(s))  Resp panel by RT-PCR (RSV, Flu A&B, Covid) Anterior Nasal Swab     Status: Abnormal   Collection Time: 05/02/23  3:48 PM   Specimen: Anterior Nasal Swab  Result  Value Ref Range Status   SARS Coronavirus 2 by RT PCR NEGATIVE NEGATIVE Final    Comment: (NOTE) SARS-CoV-2 target nucleic acids are NOT DETECTED.  The SARS-CoV-2 RNA is generally detectable in upper respiratory specimens during the acute phase of infection. The lowest concentration of SARS-CoV-2 viral copies this assay can detect is 138 copies/mL. A negative result does not preclude SARS-Cov-2 infection and should not be used as the sole basis for treatment or other patient management decisions. A negative result may occur with  improper specimen collection/handling, submission of specimen other than nasopharyngeal swab, presence of viral mutation(s) within the areas targeted by this assay, and inadequate number of viral copies(<138 copies/mL). A negative result must be combined with clinical observations, patient history, and epidemiological information. The expected result is Negative.  Fact Sheet for Patients:  BloggerCourse.com  Fact Sheet for Healthcare Providers:   SeriousBroker.it  This test is no t yet approved or cleared by the [place] FDA and  has been authorized for detection and/or diagnosis of SARS-CoV-2 by FDA under an Emergency Use Authorization (EUA). This EUA will remain  in effect (meaning this test can be used) for the duration of the COVID-19 declaration under Section 564(b)(1) of the Act, 21 U.S.C.section 360bbb-3(b)(1), unless the authorization is terminated  or revoked sooner.       Influenza A by PCR POSITIVE (A) NEGATIVE Final   Influenza B by PCR NEGATIVE NEGATIVE Final    Comment: (NOTE) The Xpert Xpress SARS-CoV-2/FLU/RSV plus assay is intended as an aid in the diagnosis of influenza from Nasopharyngeal swab specimens and should not be used as a sole basis for treatment. Nasal washings and aspirates are unacceptable for Xpert Xpress SARS-CoV-2/FLU/RSV testing.  Fact Sheet for Patients: BloggerCourse.com  Fact Sheet for Healthcare Providers: SeriousBroker.it  This test is not yet approved or cleared by the [place] FDA and has been authorized for detection and/or diagnosis of SARS-CoV-2 by FDA under an Emergency Use Authorization (EUA). This EUA will remain in effect (meaning this test can be used) for the duration of the COVID-19 declaration under Section 564(b)(1) of the Act, 21 U.S.C. section 360bbb-3(b)(1), unless the authorization is terminated or revoked.     Resp Syncytial Virus by PCR NEGATIVE NEGATIVE Final    Comment: (NOTE) Fact Sheet for Patients: BloggerCourse.com  Fact Sheet for Healthcare Providers: SeriousBroker.it  This test is not yet approved or cleared by the [place] FDA and has been authorized for detection and/or diagnosis of SARS-CoV-2 by FDA under an Emergency Use Authorization (EUA). This EUA will remain in effect (meaning this test can be used)  for the duration of the COVID-19 declaration under Section 564(b)(1) of the Act, 21 U.S.C. section 360bbb-3(b)(1), unless the authorization is terminated or revoked.  Performed at [facility], [address], [place], [place] [address]   Urine Culture     Status: Abnormal   Collection Time: 05/02/23  6:05 PM   Specimen: Urine, Clean Catch  Result Value Ref Range Status   Specimen Description   Final    URINE, CLEAN CATCH Performed at [facility], [address] [place], [place] [address]    Special Requests   Final    NONE Performed at [facility], [address], [place], [place] [address]    Culture MULTIPLE SPECIES PRESENT, SUGGEST RECOLLECTION (A)  Final   Report Status 05/03/2023 FINAL  Final  Blood culture (routine x 2)     Status: None   Collection  Time: 05/02/23  8:30 PM   Specimen: BLOOD RIGHT ARM  Result Value Ref Range Status   Specimen Description   Final    BLOOD RIGHT ARM Performed at [facility], [address], [place], [place] [address]    Special Requests   Final    BOTTLES DRAWN AEROBIC AND ANAEROBIC Blood Culture adequate volume Performed at [facility], [address], [place], [place] [address]    Culture   Final    NO GROWTH 5 DAYS Performed at [facility], [address] [place], [place] [address]    Report Status 05/08/2023 FINAL  Final  Blood culture (routine x 2)     Status: None   Collection Time: 05/02/23  8:39 PM   Specimen: BLOOD RIGHT ARM  Result Value Ref Range Status   Specimen Description   Final    BLOOD RIGHT ARM Performed at [facility], [address] [place], [place] [address]    Special Requests   Final    BOTTLES DRAWN AEROBIC ONLY Blood Culture adequate volume Performed at [facility], [address] [place], [place] [address]    Culture   Final    NO GROWTH 5 DAYS Performed at [facility], [address], [place], [place] [address]    Report Status 05/08/2023 FINAL  Final  Surgical pcr screen     Status: None   Collection Time: 05/07/23  8:23 PM   Specimen: Nasal Mucosa; Nasal Swab  Result Value Ref Range Status   MRSA, PCR NEGATIVE NEGATIVE Final   Staphylococcus aureus NEGATIVE NEGATIVE Final    Comment: (NOTE) The Xpert SA Assay (FDA approved for NASAL specimens in patients 79 years of age and older), is one component of a comprehensive surveillance program. It is not intended to diagnose infection nor to guide or monitor treatment. Performed at [facility], [address], [place], [place] [address]     Antimicrobials: Anti-infectives (From admission, onward)    Start     Dose/Rate Route Frequency Ordered Stop   05/08/23 1530  cefOXitin (MEFOXIN) 2 g in sodium chloride 0.9 % 100 mL IVPB        2 g 200 mL/hr over 30 Minutes Intravenous STAT 05/08/23 1519 05/09/23 1530   05/03/23 1000  piperacillin-tazobactam (ZOSYN) IVPB 3.375 g  Status:  Discontinued        3.375 g 12.5 mL/hr over 240 Minutes Intravenous Every 8 hours 05/02/23 2213 05/04/23 1040   05/02/23 2200  oseltamivir (TAMIFLU) capsule 30 mg        30 mg Oral 2 times daily 05/02/23 2131 05/07/23 0845   05/02/23 1800  piperacillin-tazobactam (ZOSYN) IVPB 3.375 g        3.375 g 12.5 mL/hr over 240 Minutes Intravenous  Once 05/02/23 1759 05/03/23 0542      Culture/Microbiology    Component Value Date/Time   SDES  05/02/2023 2039    BLOOD RIGHT ARM Performed at [facility], [address] [place], [place] [address]    SPECREQUEST  05/02/2023 2039    BOTTLES DRAWN AEROBIC ONLY Blood Culture adequate volume Performed at [facility], [address], [place], [place] [address]    CULT  05/02/2023 2039    NO GROWTH 5 DAYS Performed at [facility], [address], [place], [place] [address]    REPTSTATUS 05/08/2023 FINAL 05/02/2023 2039  Radiology Studies: No results found.   LOS: 9 days   Lanae Boast, MD [facility]  05/11/2023, 10:41 AM

## 2023-05-11 NOTE — Progress Notes (Signed)
Mobility Specialist - Progress Note   05/11/23 1517  Mobility  Activity Ambulated with assistance in hallway  Level of Assistance Contact guard assist, steadying assist  Assistive Device Front wheel walker  Distance Ambulated (ft) 500 ft  Range of Motion/Exercises Active  Activity Response Tolerated well  Mobility Referral Yes  $Mobility charge 1 Mobility  Mobility Specialist Start Time (ACUTE ONLY) 1500  Mobility Specialist Stop Time (ACUTE ONLY) 1517  Mobility Specialist Time Calculation (min) (ACUTE ONLY) 17 min   Pt was found on recliner chair agreeable to ambulate. Stated feeling dizzy after a couple of steps. Pt at EOS returned to recliner chair with all needs met and NT in room.  Billey Chang Mobility Specialist

## 2023-05-11 NOTE — Progress Notes (Signed)
Initial Nutrition Assessment  DOCUMENTATION CODES:   Non-severe (moderate) malnutrition in context of acute illness/injury  INTERVENTION:   -Ensure Plus High Protein po BID, each supplement provides 350 kcal and 20 grams of protein.   -Placed order for lunch per patient request  -Patient needs updated weight  NUTRITION DIAGNOSIS:   Moderate Malnutrition related to acute illness (SBO) as evidenced by mild muscle depletion, energy intake < 75% for > 7 days.  GOAL:   Patient will meet greater than or equal to 90% of their needs  MONITOR:   PO intake, Supplement acceptance, Labs, Weight trends, I & O's  REASON FOR ASSESSMENT:   NPO/Clear Liquid Diet (x 7 days)    ASSESSMENT:   87 y.o. female with a history of cholecystectomy.  Patient presented secondary to extreme fatigue, upper abdominal pain, nausea, vomiting and diarrhea. Patient was found to have evidence of influenza A infection in addition to SBO with transition point.  9/14: admitted, NPO 9/15: NGT placed 9/22: CLD 9/23: NGT removed  Patient in room, states she tolerated a clear liquid breakfast this morning. She is excited to start full liquids. Asked that I placed lunch order now on full liquids. Pt agreeable to receiving Ensure later today as PO has been very limited for 8 days now.   Admission weight: 152 lbs No other weights obtained this admission. Placed order for new weight.  Medications: Dulcolax, D5 infusion  Labs reviewed: CBGs: 124-185   NUTRITION - FOCUSED PHYSICAL EXAM:  Flowsheet Row Most Recent Value  Orbital Region No depletion  Upper Arm Region No depletion  Thoracic and Lumbar Region Unable to assess  Buccal Region Mild depletion  Temple Region Mild depletion  Clavicle Bone Region Mild depletion  Clavicle and Acromion Bone Region Mild depletion  Scapular Bone Region Mild depletion  Dorsal Hand No depletion  Patellar Region Mild depletion  Anterior Thigh Region Mild depletion   Posterior Calf Region Mild depletion  Edema (RD Assessment) None  Hair Reviewed  Eyes Reviewed  Mouth Reviewed  Skin Reviewed       Diet Order:   Diet Order             Diet full liquid Room service appropriate? Yes; Fluid consistency: Thin  Diet effective 1000                   EDUCATION NEEDS:   No education needs have been identified at this time  Skin:  Skin Assessment: Skin Integrity Issues: Skin Integrity Issues:: Incisions Incisions: 9/20 abdomen  Last BM:  9/23 -type 6  Height:   Ht Readings from Last 1 Encounters:  05/08/23 5\' 6"  (1.676 m)    Weight:   Wt Readings from Last 1 Encounters:  05/08/23 68.9 kg    BMI:  Body mass index is 24.53 kg/m.  Estimated Nutritional Needs:   Kcal:  1600-1800  Protein:  75-85g  Fluid:  1.6L/day  Tilda Franco, MS, RD, LDN Inpatient Clinical Dietitian Contact information available via Amion

## 2023-05-11 NOTE — Care Management Important Message (Signed)
Important Message  Patient Details IM Letter given. Name: Patricia Peters MRN: [id number] Date of Birth: [birth year]   Medicare Important Message Given:  Yes     [name] 05/11/2023, 10:34 AM

## 2023-05-11 NOTE — Progress Notes (Signed)
Physical Therapy Treatment Patient Details Name: Patricia Peters MRN: [id number] DOB: [birth year] Today's Date: 05/11/2023   History of Present Illness Patient is a 87 year old female admitted with SBO. Imaging: appendicitis vs cecal mass per general surgery. Flu (+). S/P ex lap 05/08/23. Hx of choecystectomy, AAA, mild memory impairment    PT Comments  Pt agreeable to working with therapy. Pt had purewick on upon my arrival. She reports that, as of late, "it just comes." Encouraged her to get up and try to use bathroom, with assistance, throughout the day. She reports some abdominal pain on today after ambulating. She reports eating a bit more today. Will continue to follow and progress activity as pt is able to tolerate. Will need to maximize home health services (HHPT, HHOT, Home Health Aide).     If plan is discharge home, recommend the following: A little help with walking and/or transfers;A little help with bathing/dressing/bathroom;Help with stairs or ramp for entrance   Can travel by private vehicle        Equipment Recommendations  Rolling walker (2 wheels)    Recommendations for Other Services       Precautions / Restrictions Precautions Precautions: Fall Restrictions Weight Bearing Restrictions: No     Mobility  Bed Mobility               General bed mobility comments: oob in recliner    Transfers Overall transfer level: Needs assistance Equipment used: Rolling walker (2 wheels) Transfers: Sit to/from Stand Sit to Stand: Supervision           General transfer comment: Cues for safety, hand placement.    Ambulation/Gait Ambulation/Gait assistance: Min assist Gait Distance (Feet): 400 Feet Assistive device: Rolling walker (2 wheels) Gait Pattern/deviations: Step-through pattern, Decreased stride length       General Gait Details: Intermittent assist to steady. Cues for safety, proper operation of RW. Tolerated distance well although reported fatigue  mid distance   Stairs             Wheelchair Mobility     Tilt Bed    Modified Rankin (Stroke Patients Only)       Balance Overall balance assessment: Needs assistance         Standing balance support: Bilateral upper extremity supported, Reliant on assistive device for balance, During functional activity Standing balance-Leahy Scale: Fair                              Cognition Arousal: Alert Behavior During Therapy: WFL for tasks assessed/performed Overall Cognitive Status: History of cognitive impairments - at baseline Area of Impairment: Problem solving, Memory                     Memory: Decreased short-term memory       Problem Solving: Requires verbal cues          Exercises      General Comments        Pertinent Vitals/Pain Pain Assessment Pain Assessment: Faces Faces Pain Scale: Hurts little more Pain Location: abdomen Pain Descriptors / Indicators: Discomfort Pain Intervention(s): Patient requesting pain meds-RN notified    Home Living                          Prior Function            PT Goals (current goals can now be found  in the care plan section) Progress towards PT goals: Progressing toward goals    Frequency    Min 1X/week      PT Plan      Co-evaluation              AM-PAC PT "6 Clicks" Mobility   Outcome Measure  Help needed turning from your back to your side while in a flat bed without using bedrails?: A Little Help needed moving from lying on your back to sitting on the side of a flat bed without using bedrails?: A Little Help needed moving to and from a bed to a chair (including a wheelchair)?: A Little Help needed standing up from a chair using your arms (e.g., wheelchair or bedside chair)?: A Little Help needed to walk in hospital room?: A Little Help needed climbing 3-5 steps with a railing? : A Little 6 Click Score: 18    End of Session Equipment Utilized  During Treatment: Gait belt Activity Tolerance: Patient tolerated treatment well Patient left: in chair;with call bell/phone within reach;with family/visitor present   PT Visit Diagnosis: Muscle weakness (generalized) (M62.81);Difficulty in walking, not elsewhere classified (R26.2)     Time: 3016-0109 PT Time Calculation (min) (ACUTE ONLY): 29 min  Charges:    $Gait Training: 23-37 mins PT General Charges $$ ACUTE PT VISIT: 1 Visit                         Faye Ramsay, PT Acute Rehabilitation  Office: [phone]

## 2023-05-11 NOTE — Progress Notes (Signed)
Progress Note  3 Days Post-Op  Subjective: Pt anxious about NGT removal - discussed concern for remaining while not connected to LIWS and risk of aspiration. Patient is tolerating CLD and having some bowel function. Ambulating 2-3 times daily. No family at bedside at time of my exam.   Objective: Vital signs in last 24 hours: Temp:  [97.5 F (36.4 C)-98.7 F (37.1 C)] 98 F (36.7 C) (09/23 0530) Pulse Rate:  [92-109] 92 (09/23 0530) Resp:  [16-19] 17 (09/23 0530) BP: (127-154)/(67-80) 154/74 (09/23 0530) SpO2:  [90 %-92 %] 90 % (09/23 0530) Last BM Date : 05/10/23  Intake/Output from previous day: 09/22 0701 - 09/23 0700 In: 2350.9 [P.O.:820; I.V.:1530.9] Out: 1200 [Urine:1150; Emesis/NG output:50] Intake/Output this shift: Total I/O In: 240 [P.O.:240] Out: 250 [Urine:250]  PE: General: pleasant, WD, WN female who is laying in bed in NAD Heart: regular, rate, and rhythm.   Lungs: CTAB, no wheezes, rhonchi, or rales noted.  Respiratory effort nonlabored Abd: soft, appropriately ttp, mild distention, +BS, NGT clamped, honeycomb to incision     Lab Results:  Recent Labs    05/10/23 0527 05/11/23 0429  WBC 8.0 8.6  HGB 10.1* 10.0*  HCT 32.0* 31.8*  PLT 145* 144*   BMET Recent Labs    05/10/23 0527 05/11/23 0429  NA 140 140  K 3.8 3.7  CL 110 105  CO2 27 28  GLUCOSE 146* 140*  BUN 13 11  CREATININE 0.61 0.59  CALCIUM 7.9* 8.3*   PT/INR No results for input(s): "LABPROT", "INR" in the last 72 hours. CMP     Component Value Date/Time   NA 140 05/11/2023 0429   K 3.7 05/11/2023 0429   CL 105 05/11/2023 0429   CO2 28 05/11/2023 0429   GLUCOSE 140 (H) 05/11/2023 0429   BUN 11 05/11/2023 0429   CREATININE 0.59 05/11/2023 0429   CALCIUM 8.3 (L) 05/11/2023 0429   PROT 5.5 (L) 05/06/2023 0500   ALBUMIN 3.0 (L) 05/06/2023 0500   AST 30 05/06/2023 0500   ALT 42 05/06/2023 0500   ALKPHOS 36 (L) 05/06/2023 0500   BILITOT 1.2 05/06/2023 0500   GFRNONAA  >60 05/11/2023 0429   Lipase     Component Value Date/Time   LIPASE <10 (L) 05/02/2023 1621       Studies/Results: No results found.  Anti-infectives: Anti-infectives (From admission, onward)    Start     Dose/Rate Route Frequency Ordered Stop   05/08/23 1530  cefOXitin (MEFOXIN) 2 g in sodium chloride 0.9 % 100 mL IVPB        2 g 200 mL/hr over 30 Minutes Intravenous STAT 05/08/23 1519 05/09/23 1530   05/03/23 1000  piperacillin-tazobactam (ZOSYN) IVPB 3.375 g  Status:  Discontinued        3.375 g 12.5 mL/hr over 240 Minutes Intravenous Every 8 hours 05/02/23 2213 05/04/23 1040   05/02/23 2200  oseltamivir (TAMIFLU) capsule 30 mg        30 mg Oral 2 times daily 05/02/23 2131 05/07/23 0845   05/02/23 1800  piperacillin-tazobactam (ZOSYN) IVPB 3.375 g        3.375 g 12.5 mL/hr over 240 Minutes Intravenous  Once 05/02/23 1759 05/03/23 0542        Assessment/Plan  SBO POD3 S/p ex-lap with extensive LOA - expected post-op ileus  - now having some bowel function and tolerating CLD with NGT clamped - recommend NGT removal and advancement to FLD - mobilize as tolerated  FEN: FLD,  ok to SLIV from surgery standpoint  VTE: LMWH ID: no current abx  - per TRH -  Influenza A, onset 9/12 Abnormal UA, asymptomatic  Thrombocytopenia  Pre-diabetes  AAA 2.8 cm  Bilateral pleural effusions, mild   LOS: 9 days     Juliet Rude, [facility] Surgery 05/11/2023, 11:13 AM Please see Amion for pager number during day hours 7:00am-4:30pm

## 2023-05-11 NOTE — Plan of Care (Signed)
  Problem: Clinical Measurements: Goal: Ability to maintain clinical measurements within normal limits will improve Outcome: Progressing Goal: Will remain free from infection Outcome: Progressing   

## 2023-05-12 DIAGNOSIS — K56609 Unspecified intestinal obstruction, unspecified as to partial versus complete obstruction: Secondary | ICD-10-CM | POA: Diagnosis not present

## 2023-05-12 DIAGNOSIS — E440 Moderate protein-calorie malnutrition: Secondary | ICD-10-CM | POA: Insufficient documentation

## 2023-05-12 LAB — CBC
HCT: 29.3 % — ABNORMAL LOW (ref 36.0–46.0)
Hemoglobin: 9.4 g/dL — ABNORMAL LOW (ref 12.0–15.0)
MCH: 29.8 pg (ref 26.0–34.0)
MCHC: 32.1 g/dL (ref 30.0–36.0)
MCV: 93 fL (ref 80.0–100.0)
Platelets: 144 10*3/uL — ABNORMAL LOW (ref 150–400)
RBC: 3.15 MIL/uL — ABNORMAL LOW (ref 3.87–5.11)
RDW: 14.1 % (ref 11.5–15.5)
WBC: 6.9 10*3/uL (ref 4.0–10.5)
nRBC: 0 % (ref 0.0–0.2)

## 2023-05-12 LAB — BASIC METABOLIC PANEL
Anion gap: 7 (ref 5–15)
BUN: 13 mg/dL (ref 8–23)
CO2: 29 mmol/L (ref 22–32)
Calcium: 8.1 mg/dL — ABNORMAL LOW (ref 8.9–10.3)
Chloride: 105 mmol/L (ref 98–111)
Creatinine, Ser: 0.66 mg/dL (ref 0.44–1.00)
GFR, Estimated: >60 mL/min (ref >60)
Glucose, Bld: 102 mg/dL — ABNORMAL HIGH (ref 70–99)
Potassium: 3.4 mmol/L — ABNORMAL LOW (ref 3.5–5.1)
Sodium: 141 mmol/L (ref 135–145)

## 2023-05-12 LAB — GLUCOSE, CAPILLARY
Glucose-Capillary: 107 mg/dL — ABNORMAL HIGH (ref 70–99)
Glucose-Capillary: 114 mg/dL — ABNORMAL HIGH (ref 70–99)
Glucose-Capillary: 122 mg/dL — ABNORMAL HIGH (ref 70–99)
Glucose-Capillary: 135 mg/dL — ABNORMAL HIGH (ref 70–99)

## 2023-05-12 MED ORDER — POTASSIUM CHLORIDE CRYS ER 20 MEQ PO TBCR
20.0000 meq | EXTENDED_RELEASE_TABLET | Freq: Once | ORAL | Status: AC
  Administered 2023-05-12: 20 meq via ORAL
  Filled 2023-05-12: qty 1

## 2023-05-12 MED ORDER — PANTOPRAZOLE SODIUM 40 MG PO TBEC
40.0000 mg | DELAYED_RELEASE_TABLET | Freq: Every day | ORAL | Status: DC
  Administered 2023-05-13 – 2023-05-14 (×2): 40 mg via ORAL
  Filled 2023-05-12 (×3): qty 1

## 2023-05-12 MED ORDER — ACETAMINOPHEN 500 MG PO TABS
1000.0000 mg | ORAL_TABLET | Freq: Four times a day (QID) | ORAL | Status: DC
  Administered 2023-05-12 – 2023-05-15 (×4): 1000 mg via ORAL
  Filled 2023-05-12 (×11): qty 2

## 2023-05-12 MED ORDER — IBUPROFEN 400 MG PO TABS: 600.0000 mg | ORAL_TABLET | Freq: Four times a day (QID) | ORAL | Status: DC | PRN

## 2023-05-12 MED ORDER — POTASSIUM CHLORIDE 20 MEQ PO PACK
20.0000 meq | PACK | Freq: Once | ORAL | Status: AC
  Administered 2023-05-12: 20 meq via ORAL
  Filled 2023-05-12: qty 1

## 2023-05-12 NOTE — Progress Notes (Signed)
PROGRESS NOTE Patricia Peters  GUY:[id number] DOB: 08-Oct-1935 DOA: 05/02/2023 PCP: Ollen Bowl, MD  Brief Narrative/Hospital Course: Patricia Peters is a 87 y.o. female with a history of cholecystectomy.  Patient presented secondary to extreme fatigue, upper abdominal pain, nausea, vomiting and diarrhea. Patient was found to have evidence of influenza A infection in addition to SBO with transition point. General surgery consulted with recommendation for conservative management. NG tube placed. Patient started on oseltamivir for influenza A treatment.Patient followed by general surgery.  Patient overall has failed to improve with ongoing abdominal distention. Respiratory status has remained stable although cough, chest x-ray has been clear no pneumonia, completed Tamiflu. 9/20: Underwent ex laparotomy with extensive lysis of adhesion 9/23: NG tube discontinued, advanced to soft diet    Subjective: Seen and examined resting on bedside chair, has no complaint, had a bowel movement today denies nausea vomiting Past 24 hours afebrile, BP has been stable.   Labs reviewed with mild hypokalemia stable CBC with chronic anemia   Assessment and Plan: Principal Problem:   SBO (small bowel obstruction) (HCC) Active Problems:   Anxiety   Influenza due to identified novel influenza A virus with other respiratory manifestations   Infrarenal abdominal aortic aneurysm (AAA) without rupture (HCC)   Hyperglycemia   Dehydration   Hypoxia   Thrombocytopenia (HCC)   CKD (chronic kidney disease) stage 2, GFR 60-89 ml/min   Memory changes   Malnutrition of moderate degree  SBO s/p ex laparotomy with extensive LOA 9/21 : Did not improve on conservative management. 9/20: Underwent ex laparotomy with extensive lysis of adhesion 9/23: NG tube discontinued.  Clinically improving having multiple BM advancing soft diet. Cont to mobilize PT OT, wean off IV fluids continue I-S, pain rx.     Enhancement/inflammation along the medial cecum without discrete/visualize appendix Small to moderate abdominopelvic ascites: As per radiology and surgery concerned about a mass in her cecum that may be causing partial obstruction versus being a separate finding.[facility] was consulted for colonoscopy and agreeable to do if SBO resolves.CEA within normal limit.  Defer to surgery/GI.  Moderate malnutrition: Augment diet, RD following Nutrition Problem: Moderate Malnutrition Etiology: acute illness (SBO) Signs/Symptoms: mild muscle depletion, energy intake < 75% for > 7 days Interventions: Ensure Enlive (each supplement provides 350kcal and 20 grams of protein)   Influenza A infection, onset 9/12 Thursday: No Pneumonia.procal Negative,  some cough only s/p Tamiflu. Cont nebs prn/scheduled.   Abnormal urinalysis-negative urine culture. Likely asymptomatic bacteruria.No treatment needed.   Thrombocytopenia: From acute viral illness, platelet counts improving.   Hypokalemia: Replete po  Hyperglycemia Prediabetes HbA1c 6.4: Stable blood sugar, will need to follow-up with PCP and work on diet modification   Dehydration Resolved with IV fluids.     Bilateral pleural effusions mild: Incidentally noted on CT scan, Monitor not hypoxic   Abdominal aortic aneurysm Incidental finding noted in the CT abdomen on admission-Aneurysm is infrarenal and measures 2.8 cm. Recommendation for outpatient vascular surgery follow-up-this was inforemed to the patient and family.  DVT prophylaxis: enoxaparin (LOVENOX) injection 40 mg Start: 05/11/23 1400 Place and maintain sequential compression device Start: 05/02/23 2129 Code Status:   Code Status: Full Code Family Communication: plan of care discussed with patient/ no family at bedside.    Patient status is: Inpatient because of SBO Level of care: Med-Surg  Dispo: The patient is from: home            Anticipated disposition: PT OT recommending home  health>  anticipating discharge tomorrow Objective: Vitals last 24 hrs: Vitals:   05/11/23 1247 05/11/23 1523 05/11/23 2002 05/12/23 0610  BP: 139/74  126/69 (!) 168/76  Pulse: 95  96 84  Resp: 16  18 18   Temp: 98.3 F (36.8 C)  (!) 97.5 F (36.4 C) 97.7 F (36.5 C)  TempSrc: Oral  Oral Oral  SpO2: 92%  93% 94%  Weight:  67.8 kg    Height:       Weight change:   Physical Examination: General exam: alert awake, oriented at baseline, older than stated age HEENT:Oral mucosa moist, Ear/Nose WNL grossly Respiratory system: Bilaterally clear BS,no use of accessory muscle Cardiovascular system: S1 & S2 +, No JVD. Gastrointestinal system: Abdomen soft, surgical site with a honeycomb dressing in place  Nervous System: Alert, awake, moving all extremities,and following commands. Extremities: LE edema neg,distal peripheral pulses palpable and warm.  Skin: No rashes,no icterus. MSK: Normal muscle bulk,tone, power   medications reviewed:  Scheduled Meds:  acetaminophen  1,000 mg Oral Q6H   bisacodyl  10 mg Rectal Daily   Chlorhexidine Gluconate Cloth  6 each Topical Daily   enoxaparin (LOVENOX) injection  40 mg Subcutaneous Q24H   feeding supplement  237 mL Oral BID BM   lidocaine  1 patch Transdermal Q24H   pantoprazole (PROTONIX) IV  40 mg Intravenous Q24H  Continuous Infusions:  methocarbamol (ROBAXIN) IV     ondansetron (ZOFRAN) IV      Diet Order             DIET SOFT Fluid consistency: Thin  Diet effective now                    Intake/Output Summary (Last 24 hours) at 05/12/2023 1128 Last data filed at 05/12/2023 1000 Gross per 24 hour  Intake 1844.73 ml  Output 350 ml  Net 1494.73 ml   Net IO Since Admission: 9,265.64 mL [05/12/23 1128]  Wt Readings from Last 3 Encounters:  05/11/23 67.8 kg  04/06/17 82.6 kg  03/28/17 82.1 kg     Unresulted Labs (From admission, onward)     Start     Ordered   05/12/23 0500  CBC  Daily,   R     Question:  Specimen  collection method  Answer:  Lab=Lab collect   05/11/23 1042   05/12/23 0500  Basic metabolic panel  Daily,   R     Question:  Specimen collection method  Answer:  Lab=Lab collect   05/11/23 1042          Data Reviewed: I have personally reviewed following labs and imaging studies CBC: Recent Labs  Lab 05/08/23 0449 05/09/23 0458 05/10/23 0527 05/11/23 0429 05/12/23 0446  WBC 7.3 8.8 8.0 8.6 6.9  HGB 11.4* 11.5* 10.1* 10.0* 9.4*  HCT 35.0* 35.3* 32.0* 31.8* 29.3*  MCV 91.4 92.9 93.6 93.8 93.0  PLT 114* 141* 145* 144* 144*   Basic Metabolic Panel: Recent Labs  Lab 05/06/23 0500 05/07/23 0512 05/08/23 0449 05/09/23 0458 05/10/23 0527 05/11/23 0429 05/12/23 0446  NA 142   < > 135 137 140 140 141  K 3.1*   < > 3.2* 3.7 3.8 3.7 3.4*  CL 106   < > 98 104 110 105 105  CO2 24   < > 28 27 27 28 29   GLUCOSE 85   < > 152* 201* 146* 140* 102*  BUN 24*   < > 11 12 13 11 13   CREATININE 0.69   < >  0.56 0.63 0.61 0.59 0.66  CALCIUM 8.4*   < > 8.1* 8.0* 7.9* 8.3* 8.1*  MG 2.0  --   --   --   --   --   --    < > = values in this interval not displayed.   GFR: Estimated Creatinine Clearance: 48.1 mL/min (by C-G formula based on SCr of 0.66 mg/dL). Liver Function Tests: Recent Labs  Lab 05/06/23 0500  AST 30  ALT 42  ALKPHOS 36*  BILITOT 1.2  PROT 5.5*  ALBUMIN 3.0*   No results for input(s): "LIPASE", "AMYLASE" in the last 168 hours. CBG: Recent Labs  Lab 05/10/23 2338 05/11/23 0533 05/11/23 1139 05/11/23 2303 05/12/23 0610  GLUCAP 136* 156* 124* 114* 107*   Recent Labs  Lab 05/07/23 0512  PROCALCITON <0.10    Recent Results (from the past 240 hour(s))  Resp panel by RT-PCR (RSV, Flu A&B, Covid) Anterior Nasal Swab     Status: Abnormal   Collection Time: 05/02/23  3:48 PM   Specimen: Anterior Nasal Swab  Result Value Ref Range Status   SARS Coronavirus 2 by RT PCR NEGATIVE NEGATIVE Final    Comment: (NOTE) SARS-CoV-2 target nucleic acids are NOT  DETECTED.  The SARS-CoV-2 RNA is generally detectable in upper respiratory specimens during the acute phase of infection. The lowest concentration of SARS-CoV-2 viral copies this assay can detect is 138 copies/mL. A negative result does not preclude SARS-Cov-2 infection and should not be used as the sole basis for treatment or other patient management decisions. A negative result may occur with  improper specimen collection/handling, submission of specimen other than nasopharyngeal swab, presence of viral mutation(s) within the areas targeted by this assay, and inadequate number of viral copies(<138 copies/mL). A negative result must be combined with clinical observations, patient history, and epidemiological information. The expected result is Negative.  Fact Sheet for Patients:  BloggerCourse.com  Fact Sheet for Healthcare Providers:  SeriousBroker.it  This test is no t yet approved or cleared by the [place] FDA and  has been authorized for detection and/or diagnosis of SARS-CoV-2 by FDA under an Emergency Use Authorization (EUA). This EUA will remain  in effect (meaning this test can be used) for the duration of the COVID-19 declaration under Section 564(b)(1) of the Act, 21 U.S.C.section 360bbb-3(b)(1), unless the authorization is terminated  or revoked sooner.       Influenza A by PCR POSITIVE (A) NEGATIVE Final   Influenza B by PCR NEGATIVE NEGATIVE Final    Comment: (NOTE) The Xpert Xpress SARS-CoV-2/FLU/RSV plus assay is intended as an aid in the diagnosis of influenza from Nasopharyngeal swab specimens and should not be used as a sole basis for treatment. Nasal washings and aspirates are unacceptable for Xpert Xpress SARS-CoV-2/FLU/RSV testing.  Fact Sheet for Patients: BloggerCourse.com  Fact Sheet for Healthcare Providers: SeriousBroker.it  This test is not  yet approved or cleared by the [place] FDA and has been authorized for detection and/or diagnosis of SARS-CoV-2 by FDA under an Emergency Use Authorization (EUA). This EUA will remain in effect (meaning this test can be used) for the duration of the COVID-19 declaration under Section 564(b)(1) of the Act, 21 U.S.C. section 360bbb-3(b)(1), unless the authorization is terminated or revoked.     Resp Syncytial Virus by PCR NEGATIVE NEGATIVE Final    Comment: (NOTE) Fact Sheet for Patients: BloggerCourse.com  Fact Sheet for Healthcare Providers: SeriousBroker.it  This test is not yet approved or cleared by the [place]  States FDA and has been authorized for detection and/or diagnosis of SARS-CoV-2 by FDA under an Emergency Use Authorization (EUA). This EUA will remain in effect (meaning this test can be used) for the duration of the COVID-19 declaration under Section 564(b)(1) of the Act, 21 U.S.C. section 360bbb-3(b)(1), unless the authorization is terminated or revoked.  Performed at [facility], [address] [place], [place] [address]   Urine Culture     Status: Abnormal   Collection Time: 05/02/23  6:05 PM   Specimen: Urine, Clean Catch  Result Value Ref Range Status   Specimen Description   Final    URINE, CLEAN CATCH Performed at [facility], [address], [place], [place] [address]    Special Requests   Final    NONE Performed at [facility], [address] [place], [place] [address]    Culture MULTIPLE SPECIES PRESENT, SUGGEST RECOLLECTION (A)  Final   Report Status 05/03/2023 FINAL  Final  Blood culture (routine x 2)     Status: None   Collection Time: 05/02/23  8:30 PM   Specimen: BLOOD RIGHT ARM  Result Value Ref Range Status   Specimen Description   Final    BLOOD RIGHT ARM Performed at [facility], [address], [place],  [place] [address]    Special Requests   Final    BOTTLES DRAWN AEROBIC AND ANAEROBIC Blood Culture adequate volume Performed at [facility], [address], [place], [place] [address]    Culture   Final    NO GROWTH 5 DAYS Performed at [facility], [address], [place], [place] [address]    Report Status 05/08/2023 FINAL  Final  Blood culture (routine x 2)     Status: None   Collection Time: 05/02/23  8:39 PM   Specimen: BLOOD RIGHT ARM  Result Value Ref Range Status   Specimen Description   Final    BLOOD RIGHT ARM Performed at [facility], [address], [place], [place] [address]    Special Requests   Final    BOTTLES DRAWN AEROBIC ONLY Blood Culture adequate volume Performed at [facility], [address], [place], [place] [address]    Culture   Final    NO GROWTH 5 DAYS Performed at [facility], [address], [place], [place] [address]    Report Status 05/08/2023 FINAL  Final  Surgical pcr screen     Status: None   Collection Time: 05/07/23  8:23 PM   Specimen: Nasal Mucosa; Nasal Swab  Result Value Ref Range Status   MRSA, PCR NEGATIVE NEGATIVE Final   Staphylococcus aureus NEGATIVE NEGATIVE Final    Comment: (NOTE) The Xpert SA Assay (FDA approved for NASAL specimens in patients 71 years of age and older), is one component of a comprehensive surveillance program. It is not intended to diagnose infection nor to guide or monitor treatment. Performed at [facility], [address], [place], [place] [address]     Antimicrobials: Anti-infectives (From admission, onward)    Start     Dose/Rate Route Frequency Ordered Stop   05/08/23 1530  cefOXitin (MEFOXIN) 2 g in sodium chloride 0.9 % 100 mL IVPB        2 g 200 mL/hr over 30 Minutes Intravenous STAT 05/08/23 1519 05/09/23 1530   05/03/23 1000  piperacillin-tazobactam (ZOSYN) IVPB 3.375 g  Status:  Discontinued  3.375 g 12.5 mL/hr  over 240 Minutes Intravenous Every 8 hours 05/02/23 2213 05/04/23 1040   05/02/23 2200  oseltamivir (TAMIFLU) capsule 30 mg        30 mg Oral 2 times daily 05/02/23 2131 05/07/23 0845   05/02/23 1800  piperacillin-tazobactam (ZOSYN) IVPB 3.375 g        3.375 g 12.5 mL/hr over 240 Minutes Intravenous  Once 05/02/23 1759 05/03/23 0542      Culture/Microbiology    Component Value Date/Time   SDES  05/02/2023 2039    BLOOD RIGHT ARM Performed at [facility], [address] [place], [place] [address]    SPECREQUEST  05/02/2023 2039    BOTTLES DRAWN AEROBIC ONLY Blood Culture adequate volume Performed at [facility], [address], [place], [place] [address]    CULT  05/02/2023 2039    NO GROWTH 5 DAYS Performed at [facility], [address], [place], [place] [address]    REPTSTATUS 05/08/2023 FINAL 05/02/2023 2039    Radiology Studies: No results found.   LOS: 10 days   Lanae Boast, MD [facility]  05/12/2023, 11:28 AM

## 2023-05-12 NOTE — TOC Progression Note (Signed)
Transition of Care [facility]) - Progression Note    Patient Details  Name: Patricia Peters MRN: [id number] Date of Birth: 02/23/1936  [facility] CM/SW Contact  Amada Jupiter, LCSW Phone Number: 05/12/2023, 2:02 PM  Clinical Narrative:     Met with pt and spoke with daughter, Patricia Peters, today to review dc plans.  Have reviewed pt's progress with therapy and noted recommendation that pt and spouse have 24/7 support in the home for safety.  Daughter reports that they have a private caregiver "stopping by" daily to check on pt's spouse (~1hr).  Daughter does not feel that pt's 94yo spouse can provide caregiver support for her.  Daughter is asking if SNF for short term rehab would be an option to continue pt's progression with mobility safety.  Explained that we can submit a bed search and for ins auth, but possibility this will be declined by insurance.  Discussed this option with pt as well and, while her preference is a home dc she is agreeable with SNF but would like to discuss further with family.  Have started SNF bed search and will follow up with pt/ family tomorrow to review offers and determine if they are still wanting to proceed with SNF plan (if insurance approves.)  Expected Discharge Plan:  (TBD) Barriers to Discharge: Continued Medical Work up  Expected Discharge Plan and Services In-house Referral: Clinical Social Work     Living arrangements for the past 2 months: Single Family Home                                       Social Determinants of Health (SDOH) Interventions SDOH Screenings   Food Insecurity: No Food Insecurity (05/02/2023)  Housing: Low Risk  (05/02/2023)  Transportation Needs: No Transportation Needs (05/02/2023)  Utilities: Not At Risk (05/02/2023)  Tobacco Use: Low Risk  (05/08/2023)    Readmission Risk Interventions    05/04/2023    2:34 PM  Readmission Risk Prevention Plan  Post Dischage Appt Complete  Medication Screening Complete   Transportation Screening Complete

## 2023-05-12 NOTE — [place] FL2 (Signed)
[place] MEDICAID FL2 LEVEL OF CARE FORM     IDENTIFICATION  Patient Name: Patricia Peters Birthdate: [birth year] Sex: female Admission Date (Current Location): 05/02/2023  [facility] and [place] Number:  Producer, television/film/video and Address:  [facility],  [address] [place] [address]      Provider Number: [id number]  Attending Physician Name and Address:  Lanae Boast, MD  Relative Name and Phone Number:  daughter, Nour Feinman @ [phone]    Current Level of Care: Hospital Recommended Level of Care: Skilled Nursing Facility Prior Approval Number:    Date Approved/Denied:   PASRR Number: [id number]  Discharge Plan: SNF    Current Diagnoses: Patient Active Problem List   Diagnosis Date Noted   Malnutrition of moderate degree 05/12/2023   Memory changes 05/04/2023   SBO (small bowel obstruction) (HCC) 05/02/2023   Anxiety 05/02/2023   Prediabetes 05/02/2023   Influenza due to identified novel influenza A virus with other respiratory manifestations 05/02/2023   Infrarenal abdominal aortic aneurysm (AAA) without rupture (HCC) 05/02/2023   Hyperglycemia 05/02/2023   Dehydration 05/02/2023   Hypoxia 05/02/2023   Thrombocytopenia (HCC) 05/02/2023   Allergic rhinitis 05/02/2023   Difficulty sleeping 05/02/2023   Pure hypercholesterolemia 05/02/2023   Anemia 05/02/2023   CKD (chronic kidney disease) stage 2, GFR 60-89 ml/min 05/02/2023   PHN (postherpetic neuralgia) 06/25/2013    Orientation RESPIRATION BLADDER Height & Weight     Self, Time, Situation, Place  Normal Continent, External catheter (currently with purewick) Weight: 149 lb 6.4 oz (67.8 kg) Height:  5\' 6"  (167.6 cm)  BEHAVIORAL SYMPTOMS/MOOD NEUROLOGICAL BOWEL NUTRITION STATUS      Incontinent, Continent Diet (soft diet)  AMBULATORY STATUS COMMUNICATION OF NEEDS Skin   Limited Assist Verbally Other (Comment) (surgical incision only)                       Personal Care  Assistance Level of Assistance  Bathing, Dressing Bathing Assistance: Limited assistance   Dressing Assistance: Limited assistance     Functional Limitations Info  Sight, Hearing, Speech Sight Info: Adequate Hearing Info: Adequate Speech Info: Adequate    SPECIAL CARE FACTORS FREQUENCY  PT (By licensed PT), OT (By licensed OT)     PT Frequency: 5x/wk OT Frequency: 5x/wk            Contractures Contractures Info: Not present    Additional Factors Info  Code Status, Allergies Code Status Info: Full Allergies Info: Ciprofloxacin, Sulfa Antibiotics           Current Medications (05/12/2023):  This is the current hospital active medication list Current Facility-Administered Medications  Medication Dose Route Frequency Provider Last Rate Last Admin   acetaminophen (TYLENOL) suppository 650 mg  650 mg Rectal Q6H PRN Chevis Pretty III, MD       acetaminophen (TYLENOL) tablet 1,000 mg  1,000 mg Oral Q6H Simaan, Elizabeth S, PA-C   1,000 mg at 05/12/23 1018   albuterol (PROVENTIL) (2.5 MG/3ML) 0.083% nebulizer solution 2.5 mg  2.5 mg Nebulization Q4H PRN Chevis Pretty III, MD       alum & mag hydroxide-simeth (MAALOX/MYLANTA) 200-200-20 MG/5ML suspension 30 mL  30 mL Oral Q6H PRN Chevis Pretty III, MD       bisacodyl (DULCOLAX) suppository 10 mg  10 mg Rectal Daily Chevis Pretty III, MD   10 mg at 05/10/23 1500   Chlorhexidine Gluconate Cloth 2 % PADS 6 each  6 each Topical Daily Kc, Ramesh,  MD   6 each at 05/11/23 1004   enoxaparin (LOVENOX) injection 40 mg  40 mg Subcutaneous Q24H Juliet Rude, PA-C   40 mg at 05/11/23 1356   feeding supplement (ENSURE ENLIVE / ENSURE PLUS) liquid 237 mL  237 mL Oral BID BM Kc, Ramesh, MD   237 mL at 05/11/23 1357   fentaNYL (SUBLIMAZE) injection 12.5-25 mcg  12.5-25 mcg Intravenous Q1H PRN Chevis Pretty III, MD   25 mcg at 05/09/23 0523   guaiFENesin-dextromethorphan (ROBITUSSIN DM) 100-10 MG/5ML syrup 5 mL  5 mL Oral Q4H PRN Chevis Pretty III, MD   5 mL at  05/08/23 0843   HYDROcodone bit-homatropine (HYCODAN) 5-1.5 MG/5ML syrup 5 mL  5 mL Oral Q6H PRN Lanae Boast, MD   5 mL at 05/11/23 1233   ibuprofen (ADVIL) tablet 600 mg  600 mg Oral Q6H PRN Adam Phenix, PA-C       lidocaine (LIDODERM) 5 % 1 patch  1 patch Transdermal Q24H Chevis Pretty III, MD   1 patch at 05/11/23 1633   lip balm (CARMEX) ointment   Topical PRN Griselda Miner, MD       magic mouthwash  15 mL Oral QID PRN Griselda Miner, MD       menthol-cetylpyridinium (CEPACOL) lozenge 3 mg  1 lozenge Oral PRN Chevis Pretty III, MD   3 mg at 05/03/23 1049   methocarbamol (ROBAXIN) 500 mg in dextrose 5 % 50 mL IVPB  500 mg Intravenous Q8H PRN Cornett, Maisie Fus, MD       metoprolol tartrate (LOPRESSOR) injection 2.5-5 mg  2.5-5 mg Intravenous Q6H PRN Chevis Pretty III, MD   5 mg at 05/06/23 0549   naphazoline-glycerin (CLEAR EYES REDNESS) ophth solution 1-2 drop  1-2 drop Both Eyes QID PRN Chevis Pretty III, MD       ondansetron [facility]) injection 4 mg  4 mg Intravenous Q6H PRN Chevis Pretty III, MD   4 mg at 05/08/23 1137   Or   ondansetron (ZOFRAN) 8 mg in sodium chloride 0.9 % 50 mL IVPB  8 mg Intravenous Q6H PRN Griselda Miner, MD       [START ON 05/13/2023] pantoprazole (PROTONIX) EC tablet 40 mg  40 mg Oral Daily Wofford, Drew A, RPH       phenol (CHLORASEPTIC) mouth spray 2 spray  2 spray Mouth/Throat PRN Chevis Pretty III, MD   2 spray at 05/06/23 1055   pneumococcal 20-valent conjugate vaccine (PREVNAR 20) injection 0.5 mL  0.5 mL Intramuscular Prior to discharge Chevis Pretty III, MD       prochlorperazine (COMPAZINE) injection 5-10 mg  5-10 mg Intravenous Q4H PRN Chevis Pretty III, MD   10 mg at 05/08/23 1021   sodium chloride (OCEAN) 0.65 % nasal spray 1-2 spray  1-2 spray Each Nare Q6H PRN Griselda Miner, MD         Discharge Medications: Please see discharge summary for a list of discharge medications.  Relevant Imaging Results:  Relevant Lab Results:   Additional Information SS#  [id number]  Amada Jupiter, LCSW

## 2023-05-12 NOTE — Plan of Care (Signed)
Problem: Health Behavior/Discharge Planning: Goal: Ability to manage health-related needs will improve Outcome: Progressing   Problem: Clinical Measurements: Goal: Ability to maintain clinical measurements within normal limits will improve Outcome: Progressing Goal: Will remain free from infection Outcome: Progressing

## 2023-05-12 NOTE — Progress Notes (Signed)
Physical Therapy Treatment Patient Details Name: Patricia Peters MRN: [id number] DOB: 1936-02-24 Today's Date: 05/12/2023   History of Present Illness Patient is a 87 year old female admitted with SBO. Imaging: appendicitis vs cecal mass per general surgery. Flu (+). S/P ex lap 05/08/23. Hx of choecystectomy, AAA, mild memory impairment    PT Comments  Pt agreeable to working with therapy. She reports fatigue/"exhaustion" on today. She has had some increased confusion during this hospital stay. At baseline, pt does not use a RW (pt and husband were active and traveled often). Min A throughout distance to ambulate without a device. Intermittent Min A to ambulate with RW. Family expresses some concerns about pt returning home and safely managing her care. Therapy currently recommends 24/7 supervision. Pt may benefit from a short rehab stay before returning home where she lives with her 19 yo husband.    If plan is discharge home, recommend the following: A little help with walking and/or transfers;A little help with bathing/dressing/bathroom;Help with stairs or ramp for entrance   Can travel by private vehicle        Equipment Recommendations  Rolling walker (2 wheels)    Recommendations for Other Services       Precautions / Restrictions Precautions Precautions: Fall Restrictions Weight Bearing Restrictions: No     Mobility  Bed Mobility Overal bed mobility: Needs Assistance Bed Mobility: Supine to Sit, Sit to Supine     Supine to sit: Supervision, HOB elevated, Used rails Sit to supine: Min assist   General bed mobility comments: Assist for LEs back onto bed. Increasd time. Cues provded.    Transfers Overall transfer level: Needs assistance Equipment used: Rolling walker (2 wheels) Transfers: Sit to/from Stand Sit to Stand: Supervision           General transfer comment: Cues for safety, hand placement.    Ambulation/Gait Ambulation/Gait assistance: Min  assist Gait Distance (Feet): 100 Feet (x2) Assistive device: Rolling walker (2 wheels), None Gait Pattern/deviations: Step-through pattern, Decreased stride length       General Gait Details: Walked x 1 with RW-intermittent assist to steady. Walked x 1 without device (pt does not use a walker at baseline)-Min A throughout distance. Cues for safety. Pt reported fatigue/"exhaustion" on today.   Stairs             Wheelchair Mobility     Tilt Bed    Modified Rankin (Stroke Patients Only)       Balance Overall balance assessment: Needs assistance         Standing balance support: Bilateral upper extremity supported, Reliant on assistive device for balance, During functional activity Standing balance-Leahy Scale: Fair                              Cognition Arousal: Alert Behavior During Therapy: WFL for tasks assessed/performed Overall Cognitive Status: History of cognitive impairments - at baseline Area of Impairment: Memory, Problem solving                     Memory: Decreased short-term memory Following Commands: Follows one step commands consistently     Problem Solving: Requires verbal cues          Exercises      General Comments        Pertinent Vitals/Pain Pain Assessment Pain Assessment: Faces Faces Pain Scale: Hurts little more Pain Location: abdomen, L arm IV site Pain Descriptors /  Indicators: Discomfort, Sore Pain Intervention(s): Limited activity within patient's tolerance, Monitored during session, Repositioned    Home Living                          Prior Function            PT Goals (current goals can now be found in the care plan section) Progress towards PT goals: Progressing toward goals    Frequency    Min 1X/week      PT Plan      Co-evaluation              AM-PAC PT "6 Clicks" Mobility   Outcome Measure  Help needed turning from your back to your side while in a flat  bed without using bedrails?: A Little Help needed moving from lying on your back to sitting on the side of a flat bed without using bedrails?: A Little Help needed moving to and from a bed to a chair (including a wheelchair)?: A Little Help needed standing up from a chair using your arms (e.g., wheelchair or bedside chair)?: A Little Help needed to walk in hospital room?: A Little Help needed climbing 3-5 steps with a railing? : A Little 6 Click Score: 18    End of Session Equipment Utilized During Treatment: Gait belt Activity Tolerance: Patient tolerated treatment well;Patient limited by fatigue Patient left: in bed;with call bell/phone within reach;with bed alarm set;with family/visitor present   PT Visit Diagnosis: Muscle weakness (generalized) (M62.81);Difficulty in walking, not elsewhere classified (R26.2)     Time: 9147-8295 PT Time Calculation (min) (ACUTE ONLY): 15 min  Charges:    $Gait Training: 8-22 mins PT General Charges $$ ACUTE PT VISIT: 1 Visit                         Faye Ramsay, PT Acute Rehabilitation  Office: [phone]

## 2023-05-12 NOTE — Progress Notes (Addendum)
Progress Note  4 Days Post-Op  Subjective: Sitting up in chair eating FLD breakfast. Denies nausa or vomiting. +flatus and multiple BMs. Mobilizing with walker.  Objective: Vital signs in last 24 hours: Temp:  [97.5 F (36.4 C)-98.3 F (36.8 C)] 97.7 F (36.5 C) (09/24 0610) Pulse Rate:  [84-96] 84 (09/24 0610) Resp:  [16-18] 18 (09/24 0610) BP: (126-168)/(69-76) 168/76 (09/24 0610) SpO2:  [92 %-94 %] 94 % (09/24 0610) Weight:  [67.8 kg] 67.8 kg (09/23 1523) Last BM Date : 05/12/23  Intake/Output from previous day: 09/23 0701 - 09/24 0700 In: 2284.8 [P.O.:1460; I.V.:824.8] Out: 550 [Urine:550] Intake/Output this shift: Total I/O In: -  Out: 50 [Urine:50]  PE: General: pleasant, WD, WN female who is laying in bed in NAD Heart: regular, rate, and rhythm.   Lungs: CTAB, no wheezes, rhonchi, or rales noted.  Respiratory effort nonlabored Abd: soft, appropriately ttp, mild distention, +BS, honeycomb to incision     Lab Results:  Recent Labs    05/11/23 0429 05/12/23 0446  WBC 8.6 6.9  HGB 10.0* 9.4*  HCT 31.8* 29.3*  PLT 144* 144*   BMET Recent Labs    05/11/23 0429 05/12/23 0446  NA 140 141  K 3.7 3.4*  CL 105 105  CO2 28 29  GLUCOSE 140* 102*  BUN 11 13  CREATININE 0.59 0.66  CALCIUM 8.3* 8.1*   PT/INR No results for input(s): "LABPROT", "INR" in the last 72 hours. CMP     Component Value Date/Time   NA 141 05/12/2023 0446   K 3.4 (L) 05/12/2023 0446   CL 105 05/12/2023 0446   CO2 29 05/12/2023 0446   GLUCOSE 102 (H) 05/12/2023 0446   BUN 13 05/12/2023 0446   CREATININE 0.66 05/12/2023 0446   CALCIUM 8.1 (L) 05/12/2023 0446   PROT 5.5 (L) 05/06/2023 0500   ALBUMIN 3.0 (L) 05/06/2023 0500   AST 30 05/06/2023 0500   ALT 42 05/06/2023 0500   ALKPHOS 36 (L) 05/06/2023 0500   BILITOT 1.2 05/06/2023 0500   GFRNONAA >60 05/12/2023 0446   Lipase     Component Value Date/Time   LIPASE <10 (L) 05/02/2023 1621       Studies/Results: No  results found.  Anti-infectives: Anti-infectives (From admission, onward)    Start     Dose/Rate Route Frequency Ordered Stop   05/08/23 1530  cefOXitin (MEFOXIN) 2 g in sodium chloride 0.9 % 100 mL IVPB        2 g 200 mL/hr over 30 Minutes Intravenous STAT 05/08/23 1519 05/09/23 1530   05/03/23 1000  piperacillin-tazobactam (ZOSYN) IVPB 3.375 g  Status:  Discontinued        3.375 g 12.5 mL/hr over 240 Minutes Intravenous Every 8 hours 05/02/23 2213 05/04/23 1040   05/02/23 2200  oseltamivir (TAMIFLU) capsule 30 mg        30 mg Oral 2 times daily 05/02/23 2131 05/07/23 0845   05/02/23 1800  piperacillin-tazobactam (ZOSYN) IVPB 3.375 g        3.375 g 12.5 mL/hr over 240 Minutes Intravenous  Once 05/02/23 1759 05/03/23 0542        Assessment/Plan  SBO POD4 S/p ex-lap with extensive LOA - ileus resolving, NG removed yesterday. - multiple BMs. Advance to SOFT diet - she c/o of soreness and discomfort - I scheduled tylenol and ordered PRN advil.  - mobilize as tolerated - I anticipate she will be ready for discharge tomorrow    FEN: SOFT  VTE: LMWH  ID: no current abx  - per TRH -  Influenza A, onset 9/12 Abnormal UA, asymptomatic  Thrombocytopenia  Pre-diabetes  AAA 2.8 cm  Bilateral pleural effusions, mild   LOS: 10 days     Adam Phenix, [facility] 05/12/2023, 10:52 AM Please see Amion for pager number during day hours 7:00am-4:30pm

## 2023-05-12 NOTE — Progress Notes (Signed)
Occupational Therapy Treatment Patient Details Name: Patricia Peters MRN: [id number] DOB: Apr 20, 1936 Today's Date: 05/12/2023   History of present illness Patient is a 87 year old female admitted with SBO. Imaging: appendicitis vs cecal mass per general surgery. Flu (+). S/P ex lap 05/08/23. Hx of choecystectomy, AAA, mild memory impairment   OT comments  Patient is seen by OT for initial time since her ex lap on 9/20.  Patient is able to perform figure-of-four postoperatively to engage in lower body dressing but with need of cues for sequencing and compensatory strategies.  Patient stood from recliner with supervision, but refused bathroom needs and refused standing at sink for oral care secondary to "I am very tired".  Patient is showing signs of confusion with occasional agitation and anxiety.  Patient's RN was notified of these concerns.  Patient also showing bilateral lower extremity edema patient was educated on elevation and ankle pumps.  patient answered back, "my legs are swollen?"  Patient also encouraged on use of I-S, and patient did demonstrate correct use of this. Currently would recommend 24/7 supervision in the home due to patient's mental status to ensure her safety and comfort.  Patient continues to have good rehab potential, current OT goals remain appropriate and acute OT will continue to work with patient until it is time to refer to home health.      If plan is discharge home, recommend the following:  Assistance with cooking/housework;Assist for transportation;Help with stairs or ramp for entrance;Supervision due to cognitive status   Equipment Recommendations  None recommended by OT    Recommendations for Other Services      Precautions / Restrictions Precautions Precautions: Fall Precaution Comments: ABD incision, keep gait belt high Restrictions Weight Bearing Restrictions: No       Mobility Bed Mobility                    Transfers                          Balance Overall balance assessment: Needs assistance   Sitting balance-Leahy Scale: Good     Standing balance support: Bilateral upper extremity supported, Reliant on assistive device for balance, During functional activity Standing balance-Leahy Scale: Fair                             ADL either performed or assessed with clinical judgement   ADL Overall ADL's : Needs assistance/impaired Eating/Feeding: Modified independent;Sitting Eating/Feeding Details (indicate cue type and reason): Pt finishing breakfast as OT entered room.   Grooming Details (indicate cue type and reason): Pt iniitally agreed to oral care at sink. Once standing, pt began ambulating to bathroom. Pt stated, "Where are we going?"  Pt told that we were headed for the sink, but she initiated ambulating towards bathroom, so OT just following. Pt asked, "What do you want me to do?"  Repeated cue for oral hygiene at sink. Pt seemed distressed at this point adn stated, "I'm tired! Let me sit down!"  OT encouraged pt to try some grooming but pt refused and was assisted back to recliner.             Lower Body Dressing: Supervision/safety;Set up;Sitting/lateral leans;Cueing for sequencing;Cueing for safety;Cueing for compensatory techniques Lower Body Dressing Details (indicate cue type and reason): With increased time and cues for technique, pt able to demonstrate intact figure 4 positioning and doffed/donned  each sock. Pt reported pain increase from 5/10 to 6/10 after task, but endorsed pain back to 5/10 after rest break in chair. Toilet Transfer: Supervision/safety;Rolling walker (2 wheels);Contact guard assist Toilet Transfer Details (indicate cue type and reason): Pt stood from recliner to RW with supervision.  Pt returned to recliner with need of cues for BUE reach back which pt did not follow. CGA for safety.         Functional mobility during ADLs: Supervision/safety;Contact guard  assist;Rolling walker (2 wheels);Cueing for safety;Cueing for sequencing      Extremity/Trunk Assessment Upper Extremity Assessment Upper Extremity Assessment: Overall WFL for tasks assessed   Lower Extremity Assessment Lower Extremity Assessment: Defer to PT evaluation   Cervical / Trunk Assessment Cervical / Trunk Assessment: Normal    Vision Baseline Vision/History: 1 Wears glasses Vision Assessment?: No apparent visual deficits   Perception     Praxis      Cognition Arousal: Alert Behavior During Therapy: Lability, Anxious, Agitated Overall Cognitive Status: History of cognitive impairments - at baseline Area of Impairment: Problem solving, Memory, Orientation, Following commands                 Orientation Level: Disoriented to, Time ("2004". Increased cues and effort to correct)   Memory: Decreased short-term memory Following Commands: Follows one step commands with increased time, Follows one step commands inconsistently     Problem Solving: Requires verbal cues General Comments: This OT has not worked with the pt before, however pt seemed confused and having decreased ability to follow instructions. Tangential with need for frequent redirection to topic or task.        Exercises      Shoulder Instructions       General Comments      Pertinent Vitals/ Pain       Pain Assessment Pain Assessment: 0-10 Pain Score: 5  Pain Location: abdomen Pain Descriptors / Indicators: Discomfort Pain Intervention(s): Limited activity within patient's tolerance, Monitored during session (Pt declined for RN to be called.)  Home Living Family/patient expects to be discharged to:: Private residence     Type of Home:  (2 story townhouse) Home Access: Stairs to enter Secretary/administrator of Steps: 2 Entrance Stairs-Rails: None Home Layout: Two level Alternate Level Stairs-Number of Steps: 1 flight Alternate Level Stairs-Rails: Right Bathroom Shower/Tub:  Chief Strategy Officer: Standard     Home Equipment: [employer] - quad          Prior Functioning/Environment              Frequency  Min 1X/week        Progress Toward Goals  OT Goals(current goals can now be found in the care plan section)  Progress towards OT goals: Progressing toward goals  Acute Rehab OT Goals OT Goal Formulation: Patient unable to participate in goal setting Time For Goal Achievement: 05/19/23 Potential to Achieve Goals: Good  Plan      Co-evaluation                 AM-PAC OT "6 Clicks" Daily Activity     Outcome Measure   Help from another person eating meals?: None Help from another person taking care of personal grooming?: A Little Help from another person toileting, which includes using toliet, bedpan, or urinal?: A Little Help from another person bathing (including washing, rinsing, drying)?: A Little Help from another person to put on and taking off regular upper body clothing?: A Little Help from  another person to put on and taking off regular lower body clothing?: A Little 6 Click Score: 19    End of Session Equipment Utilized During Treatment: Gait belt;Rolling walker (2 wheels)  OT Visit Diagnosis: Unsteadiness on feet (R26.81);Muscle weakness (generalized) (M62.81);Other symptoms and signs involving cognitive function   Activity Tolerance Patient limited by fatigue (Limited due to cognitive impairments.)   Patient Left in chair;with call bell/phone within reach;with family/visitor present   Nurse Communication Other (comment) (Concerns for pt's mentation via secure chat)        Time: 4132-4401 OT Time Calculation (min): 29 min  Charges: OT General Charges $OT Visit: 1 Visit OT Treatments $Self Care/Home Management : 8-22 mins $Therapeutic Activity: 8-22 mins  Victorino Dike, OT Acute Rehab Services Office: [phone] 05/12/2023   Theodoro Clock 05/12/2023, 10:05 AM

## 2023-05-12 NOTE — Discharge Instructions (Signed)
[facility]      [facility] °[phone] ° °OPEN ABDOMINAL SURGERY: POST OP INSTRUCTIONS ° °Always review your discharge instruction sheet given to you by the facility where your surgery was performed. ° °IF YOU HAVE DISABILITY OR FAMILY LEAVE FORMS, YOU MUST BRING THEM TO THE OFFICE FOR PROCESSING.  PLEASE DO NOT GIVE THEM TO YOUR DOCTOR. ° °A prescription for pain medication may be given to you upon discharge.  Take your pain medication as prescribed, if needed.  If narcotic pain medicine is not needed, then you may take acetaminophen (Tylenol) or ibuprofen (Advil) as needed. °Take your usually prescribed medications unless otherwise directed. °If you need a refill on your pain medication, please contact your pharmacy. They will contact our office to request authorization.  Prescriptions will not be filled after 5pm or on week-ends. °You should follow a light diet the first few days after arrival home, such as soup and crackers, pudding, etc.unless your doctor has advised otherwise. A high-fiber, low fat diet can be resumed as tolerated.   Be sure to include lots of fluids daily. Most patients will experience some swelling and bruising on the chest and neck area.  Ice packs will help.  Swelling and bruising can take several days to resolve °Most patients will experience some swelling and bruising in the area of the incision. Ice pack will help. Swelling and bruising can take several days to resolve..  °It is common to experience some constipation if taking pain medication after surgery.  Increasing fluid intake and taking a stool softener will usually help or prevent this problem from occurring.  A mild laxative (Milk of Magnesia or Miralax) should be taken according to package directions if there are no bowel movements after 48 hours. ° You may have steri-strips (small skin tapes) in place directly over the incision.  These strips should be left on the skin for 7-10 days.  If your surgeon used skin  glue on the incision, you may shower in 24 hours.  The glue will flake off over the next 2-3 weeks.  Any sutures or staples will be removed at the office during your follow-up visit. You may find that a light gauze bandage over your incision may keep your staples from being rubbed or pulled. You may shower and replace the bandage daily. °ACTIVITIES:  You may resume regular (light) daily activities beginning the next day--such as daily self-care, walking, climbing stairs--gradually increasing activities as tolerated.  You may have sexual intercourse when it is comfortable.  Refrain from any heavy lifting or straining until approved by your doctor. °You may drive when you no longer are taking prescription pain medication, you can comfortably wear a seatbelt, and you can safely maneuver your car and apply brakes ° °You should see your doctor in the office for a follow-up appointment approximately two weeks after your surgery.  Make sure that you call for this appointment within a day or two after you arrive home to insure a convenient appointment time. ° °WHEN TO CALL YOUR DOCTOR: °Fever over 101.0 °Inability to urinate °Nausea and/or vomiting °Extreme swelling or bruising °Continued bleeding from incision. °Increased pain, redness, or drainage from the incision. °Difficulty swallowing or breathing °Muscle cramping or spasms. °Numbness or tingling in hands or feet or around lips. ° °The clinic staff is available to answer your questions during regular business hours.  Please don’t hesitate to call and ask to speak to one of the nurses if you have concerns. ° °For   further questions, please visit [email or web address]  °

## 2023-05-13 DIAGNOSIS — F419 Anxiety disorder, unspecified: Secondary | ICD-10-CM

## 2023-05-13 DIAGNOSIS — D696 Thrombocytopenia, unspecified: Secondary | ICD-10-CM

## 2023-05-13 DIAGNOSIS — N182 Chronic kidney disease, stage 2 (mild): Secondary | ICD-10-CM

## 2023-05-13 DIAGNOSIS — E440 Moderate protein-calorie malnutrition: Secondary | ICD-10-CM

## 2023-05-13 DIAGNOSIS — E860 Dehydration: Secondary | ICD-10-CM

## 2023-05-13 DIAGNOSIS — I7143 Infrarenal abdominal aortic aneurysm, without rupture: Secondary | ICD-10-CM

## 2023-05-13 DIAGNOSIS — R739 Hyperglycemia, unspecified: Secondary | ICD-10-CM

## 2023-05-13 DIAGNOSIS — K56609 Unspecified intestinal obstruction, unspecified as to partial versus complete obstruction: Secondary | ICD-10-CM | POA: Diagnosis not present

## 2023-05-13 DIAGNOSIS — J09X2 Influenza due to identified novel influenza A virus with other respiratory manifestations: Secondary | ICD-10-CM

## 2023-05-13 LAB — BASIC METABOLIC PANEL
Anion gap: 6 (ref 5–15)
BUN: 14 mg/dL (ref 8–23)
CO2: 28 mmol/L (ref 22–32)
Calcium: 8 mg/dL — ABNORMAL LOW (ref 8.9–10.3)
Chloride: 101 mmol/L (ref 98–111)
Creatinine, Ser: 0.63 mg/dL (ref 0.44–1.00)
GFR, Estimated: >60 mL/min (ref >60)
Glucose, Bld: 107 mg/dL — ABNORMAL HIGH (ref 70–99)
Potassium: 3.5 mmol/L (ref 3.5–5.1)
Sodium: 135 mmol/L (ref 135–145)

## 2023-05-13 LAB — GLUCOSE, CAPILLARY
Glucose-Capillary: 98 mg/dL (ref 70–99)
Glucose-Capillary: 122 mg/dL — ABNORMAL HIGH (ref 70–99)
Glucose-Capillary: 139 mg/dL — ABNORMAL HIGH (ref 70–99)

## 2023-05-13 LAB — CBC
HCT: 30.5 % — ABNORMAL LOW (ref 36.0–46.0)
Hemoglobin: 9.8 g/dL — ABNORMAL LOW (ref 12.0–15.0)
MCH: 29.1 pg (ref 26.0–34.0)
MCHC: 32.1 g/dL (ref 30.0–36.0)
MCV: 90.5 fL (ref 80.0–100.0)
Platelets: 157 10*3/uL (ref 150–400)
RBC: 3.37 MIL/uL — ABNORMAL LOW (ref 3.87–5.11)
RDW: 13.9 % (ref 11.5–15.5)
WBC: 8.3 10*3/uL (ref 4.0–10.5)
nRBC: 0 % (ref 0.0–0.2)

## 2023-05-13 NOTE — TOC Progression Note (Signed)
Transition of Care [facility]) - Progression Note    Patient Details  Name: Patricia Peters MRN: [id number] Date of Birth: Apr 03, 1936  Transition of Care [facility]) CM/SW Contact  Amada Jupiter, LCSW Phone Number: 05/13/2023, 4:24 PM  Clinical Narrative:     Have reviewed SNF bed offers and pt has accepted bed at [facility].  Insurance authorization begun this morning and is still pending decision end of day.  Pt/ family/ MD aware.     Expected Discharge Plan:  (TBD) Barriers to Discharge: Continued Medical Work up  Expected Discharge Plan and Services In-house Referral: Clinical Social Work     Living arrangements for the past 2 months: Single Family Home                                       Social Determinants of Health (SDOH) Interventions SDOH Screenings   Food Insecurity: No Food Insecurity (05/02/2023)  Housing: Low Risk  (05/02/2023)  Transportation Needs: No Transportation Needs (05/02/2023)  Utilities: Not At Risk (05/02/2023)  Tobacco Use: Low Risk  (05/08/2023)    Readmission Risk Interventions    05/04/2023    2:34 PM  Readmission Risk Prevention Plan  Post Dischage Appt Complete  Medication Screening Complete  Transportation Screening Complete

## 2023-05-13 NOTE — Progress Notes (Signed)
Mobility Specialist - Progress Note   05/13/23 1413  Mobility  Activity Ambulated with assistance in hallway;Ambulated with assistance to bathroom  Level of Assistance Standby assist, set-up cues, supervision of patient - no hands on  Assistive Device Front wheel walker  Distance Ambulated (ft) 250 ft  Activity Response Tolerated well  Mobility Referral Yes  $Mobility charge 1 Mobility  Mobility Specialist Start Time (ACUTE ONLY) 0200  Mobility Specialist Stop Time (ACUTE ONLY) 0212  Mobility Specialist Time Calculation (min) (ACUTE ONLY) 12 min   Pt received in bed and agreeable to mobility. Prior to ambulating, pt requested assistance to the bathroom. No complaints during session. Pt to bed after session with all needs met.  Post-mobility: HR, BP, SPO2  Chief Technology Officer

## 2023-05-13 NOTE — Plan of Care (Signed)

## 2023-05-13 NOTE — Progress Notes (Signed)
Mobility Specialist - Progress Note   05/13/23 0924  Mobility  Activity Ambulated with assistance in hallway  Level of Assistance Standby assist, set-up cues, supervision of patient - no hands on  Assistive Device Front wheel walker  Distance Ambulated (ft) 500 ft  Activity Response Tolerated well  Mobility Referral Yes  $Mobility charge 1 Mobility  Mobility Specialist Start Time (ACUTE ONLY) 0909  Mobility Specialist Stop Time (ACUTE ONLY) [id number]  Mobility Specialist Time Calculation (min) (ACUTE ONLY) 14 min   Pt received in bed and agreeable to mobility. Pt was minA from supine to sitting. No complaints during session. Pt to bed after session with all needs met. Bed alarm on.    [facility]

## 2023-05-13 NOTE — Progress Notes (Signed)
PROGRESS NOTE Patricia Peters  UXL:[id number] DOB: [birth year] DOA: 05/02/2023 PCP: Ollen Bowl, MD  Brief Narrative/Hospital Course: Patricia Peters is a 87 y.o. female with a history of cholecystectomy.  Patient presented secondary to extreme fatigue, upper abdominal pain, nausea, vomiting and diarrhea. Patient was found to have evidence of influenza A infection in addition to SBO with transition point. General surgery consulted with recommendation for conservative management. NG tube placed. Patient started on oseltamivir for influenza A treatment.Patient followed by general surgery.  Patient overall has failed to improve with ongoing abdominal distention. Respiratory status has remained stable although cough, chest x-ray has been clear no pneumonia, completed Tamiflu. 9/20: Underwent ex laparotomy with extensive lysis of adhesion 9/23: NG tube discontinued, advanced to soft diet 9/25: Medically stable, awaiting insurance authorization for SNF.    Subjective: Patient was seen and examined today.  Tolerating diet and had a bowel movement.  Assessment and Plan: Principal Problem:   SBO (small bowel obstruction) (HCC) Active Problems:   Anxiety   Influenza due to identified novel influenza A virus with other respiratory manifestations   Infrarenal abdominal aortic aneurysm (AAA) without rupture (HCC)   Hyperglycemia   Dehydration   Hypoxia   Thrombocytopenia (HCC)   CKD (chronic kidney disease) stage 2, GFR 60-89 ml/min   Memory changes   Malnutrition of moderate degree  SBO s/p ex laparotomy with extensive LOA 9/21 : Did not improve on conservative management. 9/20: Underwent ex laparotomy with extensive lysis of adhesion 9/23: NG tube discontinued.  Clinically improving having multiple BM advancing soft diet. Cont to mobilize PT OT, wean off IV fluids continue I-S, pain rx.  Awaiting SNF placement   Enhancement/inflammation along the medial cecum without discrete/visualize  appendix Small to moderate abdominopelvic ascites: As per radiology and surgery concerned about a mass in her cecum that may be causing partial obstruction versus being a separate finding.[facility] was consulted for colonoscopy and agreeable to do if SBO resolves.CEA within normal limit.  Defer to surgery/GI.  Moderate malnutrition: Augment diet, RD following Nutrition Problem: Moderate Malnutrition Etiology: acute illness (SBO) Signs/Symptoms: mild muscle depletion, energy intake < 75% for > 7 days Interventions: Ensure Enlive (each supplement provides 350kcal and 20 grams of protein)   Influenza A infection, onset 9/12 Thursday: No Pneumonia.procal Negative,  some cough only s/p Tamiflu. Cont nebs prn/scheduled.   Abnormal urinalysis-negative urine culture. Likely asymptomatic bacteruria.No treatment needed.   Thrombocytopenia: From acute viral illness, platelet counts improving.   Hypokalemia: Replete po  Hyperglycemia Prediabetes HbA1c 6.4: Stable blood sugar, will need to follow-up with PCP and work on diet modification   Dehydration Resolved with IV fluids.     Bilateral pleural effusions mild: Incidentally noted on CT scan, Monitor not hypoxic   Abdominal aortic aneurysm Incidental finding noted in the CT abdomen on admission-Aneurysm is infrarenal and measures 2.8 cm. Recommendation for outpatient vascular surgery follow-up-this was inforemed to the patient and family.  DVT prophylaxis: enoxaparin (LOVENOX) injection 40 mg Start: 05/11/23 1400 Place and maintain sequential compression device Start: 05/02/23 2129 Code Status:   Code Status: Full Code Family Communication: plan of care discussed with patient/ no family at bedside.    Patient status is: Inpatient because of SBO Level of care: Med-Surg  Dispo: The patient is from: home            Anticipated disposition: SNF-awaiting insurance authorization  Objective: Vitals last 24 hrs: Vitals:   05/12/23 1335  05/12/23 2033 05/13/23 0604 05/13/23  1308  BP: (!) 157/86 (!) 167/89 (!) 173/83 134/69  Pulse: 90 93 89 96  Resp: 18 18 18 18   Temp: 98.1 F (36.7 C) 98.2 F (36.8 C) 98.3 F (36.8 C) 97.9 F (36.6 C)  TempSrc: Oral Oral Oral Oral  SpO2: 95% 93% 92% 95%  Weight:      Height:       Weight change:   Physical Examination: General.  Frail elderly lady, in no acute distress. Pulmonary.  Lungs clear bilaterally, normal respiratory effort. CV.  Regular rate and rhythm, no JVD, rub or murmur. Abdomen.  Soft, nontender, nondistended, BS positive.  Midline surgical wound with a honeycomb bandage CNS.  Alert and oriented .  No focal neurologic deficit. Extremities.  No edema, no cyanosis, pulses intact and symmetrical.  medications reviewed:  Scheduled Meds:  acetaminophen  1,000 mg Oral Q6H   bisacodyl  10 mg Rectal Daily   Chlorhexidine Gluconate Cloth  6 each Topical Daily   enoxaparin (LOVENOX) injection  40 mg Subcutaneous Q24H   feeding supplement  237 mL Oral BID BM   lidocaine  1 patch Transdermal Q24H   pantoprazole  40 mg Oral Daily  Continuous Infusions:  methocarbamol (ROBAXIN) IV     ondansetron (ZOFRAN) IV      Diet Order             DIET SOFT Fluid consistency: Thin  Diet effective now                    Intake/Output Summary (Last 24 hours) at 05/13/2023 1551 Last data filed at 05/13/2023 1300 Gross per 24 hour  Intake 1500 ml  Output 900 ml  Net 600 ml   Net IO Since Admission: 10,068.07 mL [05/13/23 1551]  Wt Readings from Last 3 Encounters:  05/11/23 67.8 kg  04/06/17 82.6 kg  03/28/17 82.1 kg     Unresulted Labs (From admission, onward)    None     Data Reviewed: I have personally reviewed following labs and imaging studies CBC: Recent Labs  Lab 05/09/23 0458 05/10/23 0527 05/11/23 0429 05/12/23 0446 05/13/23 0449  WBC 8.8 8.0 8.6 6.9 8.3  HGB 11.5* 10.1* 10.0* 9.4* 9.8*  HCT 35.3* 32.0* 31.8* 29.3* 30.5*  MCV 92.9 93.6 93.8 93.0  90.5  PLT 141* 145* 144* 144* 157   Basic Metabolic Panel: Recent Labs  Lab 05/09/23 0458 05/10/23 0527 05/11/23 0429 05/12/23 0446 05/13/23 0449  NA 137 140 140 141 135  K 3.7 3.8 3.7 3.4* 3.5  CL 104 110 105 105 101  CO2 27 27 28 29 28   GLUCOSE 201* 146* 140* 102* 107*  BUN 12 13 11 13 14   CREATININE 0.63 0.61 0.59 0.66 0.63  CALCIUM 8.0* 7.9* 8.3* 8.1* 8.0*   GFR: Estimated Creatinine Clearance: 48.1 mL/min (by C-G formula based on SCr of 0.63 mg/dL). Liver Function Tests: No results for input(s): "AST", "ALT", "ALKPHOS", "BILITOT", "PROT", "ALBUMIN" in the last 168 hours.  No results for input(s): "LIPASE", "AMYLASE" in the last 168 hours. CBG: Recent Labs  Lab 05/12/23 1136 05/12/23 1753 05/12/23 2332 05/13/23 0559 05/13/23 1133  GLUCAP 135* 114* 122* 98 122*   Recent Labs  Lab 05/07/23 0512  PROCALCITON <0.10    Recent Results (from the past 240 hour(s))  Surgical pcr screen     Status: None   Collection Time: 05/07/23  8:23 PM   Specimen: Nasal Mucosa; Nasal Swab  Result Value Ref Range Status  MRSA, PCR NEGATIVE NEGATIVE Final   Staphylococcus aureus NEGATIVE NEGATIVE Final    Comment: (NOTE) The Xpert SA Assay (FDA approved for NASAL specimens in patients 19 years of age and older), is one component of a comprehensive surveillance program. It is not intended to diagnose infection nor to guide or monitor treatment. Performed at [facility], [address] [place], [place] [address]     Antimicrobials: Anti-infectives (From admission, onward)    Start     Dose/Rate Route Frequency Ordered Stop   05/08/23 1530  cefOXitin (MEFOXIN) 2 g in sodium chloride 0.9 % 100 mL IVPB        2 g 200 mL/hr over 30 Minutes Intravenous STAT 05/08/23 1519 05/09/23 1530   05/03/23 1000  piperacillin-tazobactam (ZOSYN) IVPB 3.375 g  Status:  Discontinued        3.375 g 12.5 mL/hr over 240 Minutes Intravenous Every 8 hours 05/02/23 2213  05/04/23 1040   05/02/23 2200  oseltamivir (TAMIFLU) capsule 30 mg        30 mg Oral 2 times daily 05/02/23 2131 05/07/23 0845   05/02/23 1800  piperacillin-tazobactam (ZOSYN) IVPB 3.375 g        3.375 g 12.5 mL/hr over 240 Minutes Intravenous  Once 05/02/23 1759 05/03/23 0542      Culture/Microbiology    Component Value Date/Time   SDES  05/02/2023 2039    BLOOD RIGHT ARM Performed at [facility], [address], [place], [place] [address]    SPECREQUEST  05/02/2023 2039    BOTTLES DRAWN AEROBIC ONLY Blood Culture adequate volume Performed at [facility], [address], [place], [place] [address]    CULT  05/02/2023 2039    NO GROWTH 5 DAYS Performed at [facility], [address] [place], [place] [address]    REPTSTATUS 05/08/2023 FINAL 05/02/2023 2039    Radiology Studies: No results found.   LOS: 11 days   Arnetha Courser, MD [facility]  05/13/2023, 3:51 PM

## 2023-05-13 NOTE — Progress Notes (Signed)
Progress Note  5 Days Post-Op  Subjective: Resting comfortably, just had breakfast. Tolerating PO. BMx2 yesterday.    Objective: Vital signs in last 24 hours: Temp:  [98.1 F (36.7 C)-98.3 F (36.8 C)] 98.3 F (36.8 C) (09/25 0604) Pulse Rate:  [89-93] 89 (09/25 0604) Resp:  [18] 18 (09/25 0604) BP: (157-173)/(83-89) 173/83 (09/25 0604) SpO2:  [92 %-95 %] 92 % (09/25 0604) Last BM Date : 05/12/23  Intake/Output from previous day: 09/24 0701 - 09/25 0700 In: 1392.4 [P.O.:1200; I.V.:192.4] Out: 700 [Urine:700] Intake/Output this shift: Total I/O In: 180 [P.O.:180] Out: 400 [Urine:400]  PE: General: pleasant, WD, WN female who is laying in bed in NAD Heart: regular, rate, and rhythm.   Lungs: CTAB, no wheezes, rhonchi, or rales noted.  Respiratory effort nonlabored Abd: soft, appropriately ttp, mild distention, +BS, honeycomb to incision     Lab Results:  Recent Labs    05/12/23 0446 05/13/23 0449  WBC 6.9 8.3  HGB 9.4* 9.8*  HCT 29.3* 30.5*  PLT 144* 157   BMET Recent Labs    05/12/23 0446 05/13/23 0449  NA 141 135  K 3.4* 3.5  CL 105 101  CO2 29 28  GLUCOSE 102* 107*  BUN 13 14  CREATININE 0.66 0.63  CALCIUM 8.1* 8.0*   PT/INR No results for input(s): "LABPROT", "INR" in the last 72 hours. CMP     Component Value Date/Time   NA 135 05/13/2023 0449   K 3.5 05/13/2023 0449   CL 101 05/13/2023 0449   CO2 28 05/13/2023 0449   GLUCOSE 107 (H) 05/13/2023 0449   BUN 14 05/13/2023 0449   CREATININE 0.63 05/13/2023 0449   CALCIUM 8.0 (L) 05/13/2023 0449   PROT 5.5 (L) 05/06/2023 0500   ALBUMIN 3.0 (L) 05/06/2023 0500   AST 30 05/06/2023 0500   ALT 42 05/06/2023 0500   ALKPHOS 36 (L) 05/06/2023 0500   BILITOT 1.2 05/06/2023 0500   GFRNONAA >60 05/13/2023 0449   Lipase     Component Value Date/Time   LIPASE <10 (L) 05/02/2023 1621       Studies/Results: No results found.  Anti-infectives: Anti-infectives (From admission, onward)     Start     Dose/Rate Route Frequency Ordered Stop   05/08/23 1530  cefOXitin (MEFOXIN) 2 g in sodium chloride 0.9 % 100 mL IVPB        2 g 200 mL/hr over 30 Minutes Intravenous STAT 05/08/23 1519 05/09/23 1530   05/03/23 1000  piperacillin-tazobactam (ZOSYN) IVPB 3.375 g  Status:  Discontinued        3.375 g 12.5 mL/hr over 240 Minutes Intravenous Every 8 hours 05/02/23 2213 05/04/23 1040   05/02/23 2200  oseltamivir (TAMIFLU) capsule 30 mg        30 mg Oral 2 times daily 05/02/23 2131 05/07/23 0845   05/02/23 1800  piperacillin-tazobactam (ZOSYN) IVPB 3.375 g        3.375 g 12.5 mL/hr over 240 Minutes Intravenous  Once 05/02/23 1759 05/03/23 0542        Assessment/Plan  SBO POD4 S/p ex-lap with extensive LOA - having BMs, tolerating soft diet.  - walked with mobility tech today  - mobilize as tolerated - stable for discharge from a surgical perspective. I will arrange post-op follow up for staple removal and follow up with MD.   FEN: SOFT  VTE: LMWH ID: no current abx  - per TRH -  Influenza A, onset 9/12 Abnormal UA, asymptomatic  Thrombocytopenia  Pre-diabetes  AAA 2.8 cm  Bilateral pleural effusions, mild   LOS: 11 days     Adam Phenix, [facility] 05/13/2023, 11:17 AM Please see Amion for pager number during day hours 7:00am-4:30pm

## 2023-05-14 DIAGNOSIS — N182 Chronic kidney disease, stage 2 (mild): Secondary | ICD-10-CM | POA: Diagnosis not present

## 2023-05-14 DIAGNOSIS — F419 Anxiety disorder, unspecified: Secondary | ICD-10-CM | POA: Diagnosis not present

## 2023-05-14 DIAGNOSIS — R739 Hyperglycemia, unspecified: Secondary | ICD-10-CM | POA: Diagnosis not present

## 2023-05-14 DIAGNOSIS — K56609 Unspecified intestinal obstruction, unspecified as to partial versus complete obstruction: Secondary | ICD-10-CM | POA: Diagnosis not present

## 2023-05-14 LAB — GLUCOSE, CAPILLARY
Glucose-Capillary: 109 mg/dL — ABNORMAL HIGH (ref 70–99)
Glucose-Capillary: 109 mg/dL — ABNORMAL HIGH (ref 70–99)
Glucose-Capillary: 121 mg/dL — ABNORMAL HIGH (ref 70–99)
Glucose-Capillary: 126 mg/dL — ABNORMAL HIGH (ref 70–99)
Glucose-Capillary: 128 mg/dL — ABNORMAL HIGH (ref 70–99)

## 2023-05-14 NOTE — Progress Notes (Signed)
PROGRESS NOTE Patricia Peters  ZOX:[id number] DOB: 06-24-1936 DOA: 05/02/2023 PCP: Ollen Bowl, MD  Brief Narrative/Hospital Course: Patricia Peters is a 87 y.o. female with a history of cholecystectomy.  Patient presented secondary to extreme fatigue, upper abdominal pain, nausea, vomiting and diarrhea. Patient was found to have evidence of influenza A infection in addition to SBO with transition point. General surgery consulted with recommendation for conservative management. NG tube placed. Patient started on oseltamivir for influenza A treatment.Patient followed by general surgery.  Patient overall has failed to improve with ongoing abdominal distention. Respiratory status has remained stable although cough, chest x-ray has been clear no pneumonia, completed Tamiflu. 9/20: Underwent ex laparotomy with extensive lysis of adhesion 9/23: NG tube discontinued, advanced to soft diet 9/25: Medically stable, awaiting insurance authorization for SNF. 9/26: Patient continued to have improved mobility, insurance authorization denied even with peer to peer stating that she has to go to mobility for a short-term rehab.  Likely be going home tomorrow with home health services.    Subjective: Patient was seen and examined today.  No new concern.  Had a bowel movement.  Assessment and Plan: Principal Problem:   SBO (small bowel obstruction) (HCC) Active Problems:   Anxiety   Influenza due to identified novel influenza A virus with other respiratory manifestations   Infrarenal abdominal aortic aneurysm (AAA) without rupture (HCC)   Hyperglycemia   Dehydration   Hypoxia   Thrombocytopenia (HCC)   CKD (chronic kidney disease) stage 2, GFR 60-89 ml/min   Memory changes   Malnutrition of moderate degree  SBO s/p ex laparotomy with extensive LOA 9/21 : Did not improve on conservative management. 9/20: Underwent ex laparotomy with extensive lysis of adhesion 9/23: NG tube discontinued.  Clinically  improving having multiple BM advancing soft diet. Cont to mobilize PT OT, wean off IV fluids continue I-S, pain rx.   enhancement/inflammation along the medial cecum without discrete/visualize appendix Small to moderate abdominopelvic ascites: As per radiology and surgery concerned about a mass in her cecum that may be causing partial obstruction versus being a separate finding.[facility] was consulted for colonoscopy and agreeable to do if SBO resolves.CEA within normal limit.  Defer to surgery/GI.  Moderate malnutrition: Augment diet, RD following Nutrition Problem: Moderate Malnutrition Etiology: acute illness (SBO) Signs/Symptoms: mild muscle depletion, energy intake < 75% for > 7 days Interventions: Ensure Enlive (each supplement provides 350kcal and 20 grams of protein)   Influenza A infection, onset 9/12 Thursday: No Pneumonia.procal Negative,  some cough only s/p Tamiflu. Cont nebs prn/scheduled.   Abnormal urinalysis-negative urine culture. Likely asymptomatic bacteruria.No treatment needed.   Thrombocytopenia: From acute viral illness, platelet counts improving.   Hypokalemia: Replete po  Hyperglycemia Prediabetes HbA1c 6.4: Stable blood sugar, will need to follow-up with PCP and work on diet modification   Dehydration Resolved with IV fluids.     Bilateral pleural effusions mild: Incidentally noted on CT scan, Monitor not hypoxic   Abdominal aortic aneurysm Incidental finding noted in the CT abdomen on admission-Aneurysm is infrarenal and measures 2.8 cm. Recommendation for outpatient vascular surgery follow-up-this was inforemed to the patient and family.  DVT prophylaxis: enoxaparin (LOVENOX) injection 40 mg Start: 05/11/23 1400 Place and maintain sequential compression device Start: 05/02/23 2129 Code Status:   Code Status: Full Code Family Communication: plan of care discussed with patient/ no family at bedside.    Patient status is: Inpatient because of  SBO Level of care: Med-Surg  Dispo: The patient is  from: home            Anticipated disposition: Insurance authorization was denied, likely be going home with home health tomorrow  Objective: Vitals last 24 hrs: Vitals:   05/13/23 1308 05/13/23 2021 05/14/23 0537 05/14/23 1122  BP: 134/69 (!) 149/74 119/67 127/79  Pulse: 96 90 100 95  Resp: 18 17 18 18   Temp: 97.9 F (36.6 C) 98.7 F (37.1 C) 98.3 F (36.8 C) 98.6 F (37 C)  TempSrc: Oral Oral Oral Oral  SpO2: 95% 92% 94% 92%  Weight:      Height:       Weight change:   Physical Examination: General.  Frail elderly lady, in no acute distress. Pulmonary.  Lungs clear bilaterally, normal respiratory effort. CV.  Regular rate and rhythm, no JVD, rub or murmur. Abdomen.  Soft, nontender, nondistended, BS positive. CNS.  Alert and oriented .  No focal neurologic deficit. Extremities.  No edema, no cyanosis, pulses intact and symmetrical. Psychiatry.  Judgment and insight appears normal.   medications reviewed:  Scheduled Meds:  acetaminophen  1,000 mg Oral Q6H   bisacodyl  10 mg Rectal Daily   Chlorhexidine Gluconate Cloth  6 each Topical Daily   enoxaparin (LOVENOX) injection  40 mg Subcutaneous Q24H   feeding supplement  237 mL Oral BID BM   lidocaine  1 patch Transdermal Q24H   pantoprazole  40 mg Oral Daily  Continuous Infusions:  methocarbamol (ROBAXIN) IV     ondansetron (ZOFRAN) IV      Diet Order             DIET SOFT Fluid consistency: Thin  Diet effective now                    Intake/Output Summary (Last 24 hours) at 05/14/2023 1507 Last data filed at 05/14/2023 1131 Gross per 24 hour  Intake 1140 ml  Output 1500 ml  Net -360 ml   Net IO Since Admission: 9,708.07 mL [05/14/23 1507]  Wt Readings from Last 3 Encounters:  05/11/23 67.8 kg  04/06/17 82.6 kg  03/28/17 82.1 kg     Unresulted Labs (From admission, onward)    None     Data Reviewed: I have personally reviewed following labs  and imaging studies CBC: Recent Labs  Lab 05/09/23 0458 05/10/23 0527 05/11/23 0429 05/12/23 0446 05/13/23 0449  WBC 8.8 8.0 8.6 6.9 8.3  HGB 11.5* 10.1* 10.0* 9.4* 9.8*  HCT 35.3* 32.0* 31.8* 29.3* 30.5*  MCV 92.9 93.6 93.8 93.0 90.5  PLT 141* 145* 144* 144* 157   Basic Metabolic Panel: Recent Labs  Lab 05/09/23 0458 05/10/23 0527 05/11/23 0429 05/12/23 0446 05/13/23 0449  NA 137 140 140 141 135  K 3.7 3.8 3.7 3.4* 3.5  CL 104 110 105 105 101  CO2 27 27 28 29 28   GLUCOSE 201* 146* 140* 102* 107*  BUN 12 13 11 13 14   CREATININE 0.63 0.61 0.59 0.66 0.63  CALCIUM 8.0* 7.9* 8.3* 8.1* 8.0*   GFR: Estimated Creatinine Clearance: 48.1 mL/min (by C-G formula based on SCr of 0.63 mg/dL). Liver Function Tests: No results for input(s): "AST", "ALT", "ALKPHOS", "BILITOT", "PROT", "ALBUMIN" in the last 168 hours.  No results for input(s): "LIPASE", "AMYLASE" in the last 168 hours. CBG: Recent Labs  Lab 05/13/23 1133 05/13/23 1804 05/14/23 0020 05/14/23 0533 05/14/23 1124  GLUCAP 122* 139* 109* 109* 121*   No results for input(s): "PROCALCITON", "LATICACIDVEN" in the last 168 hours.  Recent Results (from the past 240 hour(s))  Surgical pcr screen     Status: None   Collection Time: 05/07/23  8:23 PM   Specimen: Nasal Mucosa; Nasal Swab  Result Value Ref Range Status   MRSA, PCR NEGATIVE NEGATIVE Final   Staphylococcus aureus NEGATIVE NEGATIVE Final    Comment: (NOTE) The Xpert SA Assay (FDA approved for NASAL specimens in patients 81 years of age and older), is one component of a comprehensive surveillance program. It is not intended to diagnose infection nor to guide or monitor treatment. Performed at [facility], [address], [place], [place] [address]     Antimicrobials: Anti-infectives (From admission, onward)    Start     Dose/Rate Route Frequency Ordered Stop   05/08/23 1530  cefOXitin (MEFOXIN) 2 g in sodium chloride 0.9 % 100  mL IVPB        2 g 200 mL/hr over 30 Minutes Intravenous STAT 05/08/23 1519 05/09/23 1530   05/03/23 1000  piperacillin-tazobactam (ZOSYN) IVPB 3.375 g  Status:  Discontinued        3.375 g 12.5 mL/hr over 240 Minutes Intravenous Every 8 hours 05/02/23 2213 05/04/23 1040   05/02/23 2200  oseltamivir (TAMIFLU) capsule 30 mg        30 mg Oral 2 times daily 05/02/23 2131 05/07/23 0845   05/02/23 1800  piperacillin-tazobactam (ZOSYN) IVPB 3.375 g        3.375 g 12.5 mL/hr over 240 Minutes Intravenous  Once 05/02/23 1759 05/03/23 0542      Culture/Microbiology    Component Value Date/Time   SDES  05/02/2023 2039    BLOOD RIGHT ARM Performed at [facility], [address] [place], [place] [address]    SPECREQUEST  05/02/2023 2039    BOTTLES DRAWN AEROBIC ONLY Blood Culture adequate volume Performed at [facility], [address], [place], [place] [address]    CULT  05/02/2023 2039    NO GROWTH 5 DAYS Performed at [facility], [address] [place], [place] [address]    REPTSTATUS 05/08/2023 FINAL 05/02/2023 2039    Radiology Studies: No results found.   LOS: 12 days   Arnetha Courser, MD [facility]  05/14/2023, 3:07 PM

## 2023-05-14 NOTE — Progress Notes (Signed)
Physical Therapy Treatment Patient Details Name: Patricia Peters MRN: [id number] DOB: [birth year] Today's Date: 05/14/2023   History of Present Illness Patient is a 87 year old female admitted with SBO. Imaging: appendicitis vs cecal mass per general surgery. Flu (+). S/P ex lap 05/08/23. Hx of choecystectomy, AAA, mild memory impairment    PT Comments  Pt seen for PT tx with pt received in handoff from NT, as pt ambulating in hallway. Pt is able to ambulate in hallway with RW & supervision (up to 300 ft) with min cuing to ambulate within base of RW. Attempted to have pt practice stair negotiation as pt has flight of stairs in her home but pt becomes very anxious/panics when PT opens stairwell door. Pt tells spouse on phone she feels comfortable negotiating her stairs at home but is scared to practice stairs in hospital 2/2 unfamiliarity. Pt left in recliner with meal tray set up.   If plan is discharge home, recommend the following: Assistance with cooking/housework;Assist for transportation;Help with stairs or ramp for entrance   Can travel by private vehicle     Yes  Equipment Recommendations  Rolling walker (2 wheels)    Recommendations for Other Services       Precautions / Restrictions Precautions Precautions: Fall Precaution Comments: abdominal incision Restrictions Weight Bearing Restrictions: No     Mobility  Bed Mobility               General bed mobility comments: not tested, pt received & left sitting in recliner    Transfers Overall transfer level: Needs assistance Equipment used: Rolling walker (2 wheels) Transfers: Sit to/from Stand Sit to Stand: Supervision           General transfer comment: STS from recliner with cuing to push to standing vs BUE on RW    Ambulation/Gait Ambulation/Gait assistance: Supervision Gait Distance (Feet): 170 Feet (+ 300 ft) Assistive device: Rolling walker (2 wheels) Gait Pattern/deviations: Step-through pattern Gait  velocity: slightly decreased     General Gait Details: Education to ambulate within base of AD vs pushing it out in front of her.   Stairs             Wheelchair Mobility     Tilt Bed    Modified Rankin (Stroke Patients Only)       Balance Overall balance assessment: Needs assistance         Standing balance support: Bilateral upper extremity supported, Reliant on assistive device for balance, During functional activity Standing balance-Leahy Scale: Good                              Cognition Arousal: Alert Behavior During Therapy: WFL for tasks assessed/performed Overall Cognitive Status: History of cognitive impairments - at baseline                                 General Comments: Pt follows simple commands throughout session. Pt doing well with mobility but endorses fatigue. Pt talking on phone to spouse & tells him she feels comfortable negotiating their stairs at home but is nervous to attempt stairs in hospital. PT attempted to have pt practice stair negotiation but upon looking at stairwell pt begins to panic, repeatedly stating "I can't do it, don't make me, I don't want to" despite PT stating "okay" multiple times. Pt reports she feels comfortable negotiating stairs at  home, even as early as today, as she has negotiated those stairs for years vs these stairs are new.        Exercises      General Comments        Pertinent Vitals/Pain Pain Assessment Pain Assessment: Faces Faces Pain Scale: No hurt    Home Living                          Prior Function            PT Goals (current goals can now be found in the care plan section) Acute Rehab PT Goals Patient Stated Goal: regain PLOF/independence; get better PT Goal Formulation: With patient/family Time For Goal Achievement: 05/19/23 Potential to Achieve Goals: Good Progress towards PT goals: Goals downgraded-see care plan    Frequency    Min  1X/week      PT Plan      Co-evaluation              AM-PAC PT "6 Clicks" Mobility   Outcome Measure  Help needed turning from your back to your side while in a flat bed without using bedrails?: None Help needed moving from lying on your back to sitting on the side of a flat bed without using bedrails?: A Little Help needed moving to and from a bed to a chair (including a wheelchair)?: A Little Help needed standing up from a chair using your arms (e.g., wheelchair or bedside chair)?: A Little Help needed to walk in hospital room?: A Little Help needed climbing 3-5 steps with a railing? : A Little 6 Click Score: 19    End of Session   Activity Tolerance: Patient tolerated treatment well Patient left: in chair;with call bell/phone within reach   PT Visit Diagnosis: Muscle weakness (generalized) (M62.81);Difficulty in walking, not elsewhere classified (R26.2)     Time: 0865-7846 PT Time Calculation (min) (ACUTE ONLY): 17 min  Charges:    $Therapeutic Activity: 8-22 mins PT General Charges $$ ACUTE PT VISIT: 1 Visit                     Aleda Grana, PT, DPT 05/14/23, 11:57 AM   Sandi Mariscal 05/14/2023, 11:56 AM

## 2023-05-14 NOTE — TOC Progression Note (Addendum)
Transition of Care [facility]) - Progression Note    Patient Details  Name: Patricia Peters MRN: [id number] Date of Birth: 10/11/1935  Transition of Care [facility]) CM/SW Contact  Adrian Prows, RN Phone Number: 05/14/2023, 9:43 AM  Clinical Narrative:    Ins auth still pending.  -1231- spoke w/ pt in room; pt says she wants to go to SNF; pt's dtr Darol Destine [phone]) also notified; will attempt to contact [employer] to find out status of ins auth; pt and dtr will both be updated.  -1238- called [employer] and spoke with Vonna Kotyk, Representative; she says request is still pending review; pt and dtr Shelly notified.  -1309- called pt pt's son Thayer Ohm; he was updated that ins Berkley Harvey is still pending review; he had questions regarding costs incurred by pt; he was given main hospital # and will request to be transferred to billing.  -1315- Received VM from Josh at [facility] and [facility]; message says peer-to-peer requested by their medical director; deadline for this to occur is 05/15/23 by 9 AM; he says if not completed, the a decision will be the medical director; to initiate this process, the attending MD should call [phone], option 5; the member's DOB and subscriber # [id number]; Dr Kathie Rhodes. Amin notified via secure chat.   Expected Discharge Plan:  (TBD) Barriers to Discharge: Continued Medical Work up  Expected Discharge Plan and Services In-house Referral: Clinical Social Work     Living arrangements for the past 2 months: Single Family Home                                       Social Determinants of Health (SDOH) Interventions SDOH Screenings   Food Insecurity: No Food Insecurity (05/02/2023)  Housing: Low Risk  (05/02/2023)  Transportation Needs: No Transportation Needs (05/02/2023)  Utilities: Not At Risk (05/02/2023)  Tobacco Use: Low Risk  (05/08/2023)    Readmission Risk Interventions    05/04/2023    2:34 PM  Readmission Risk Prevention Plan   Post Dischage Appt Complete  Medication Screening Complete  Transportation Screening Complete

## 2023-05-14 NOTE — TOC Progression Note (Addendum)
Transition of Care [facility]) - Progression Note    Patient Details  Name: Patricia Peters MRN: [id number] Date of Birth: Dec 31, 1935  [facility]) CM/SW Contact  Adrian Prows, RN Phone Number: 05/14/2023, 3:49 PM  Clinical Narrative:    Notified peer-to-peer completed; pt's auth for SNF denied; notified pt, dtr Shelly, and son Thayer Ohm; family says pt does not have long-term care insurance; they would like optimized [facility] services; they do not have an agency preference; family says will also arrange transportation home for pt; spoke w/ Mayra Reel at [facility]; she says agency can provide services (HHPT/OT/RN/Aide); Amy was also given dtr's phone # to schedule start of care; dtr Shelly notified; contact information for agency placed in follow up provider section of d/c instructions   Expected Discharge Plan:  (TBD) Barriers to Discharge: Continued Medical Work up  Expected Discharge Plan and Services In-house Referral: Clinical Social Work     Living arrangements for the past 2 months: Single Family Home                                       Social Determinants of Health (SDOH) Interventions SDOH Screenings   Food Insecurity: No Food Insecurity (05/02/2023)  Housing: Low Risk  (05/02/2023)  Transportation Needs: No Transportation Needs (05/02/2023)  Utilities: Not At Risk (05/02/2023)  Tobacco Use: Low Risk  (05/08/2023)    Readmission Risk Interventions    05/04/2023    2:34 PM  Readmission Risk Prevention Plan  Post Dischage Appt Complete  Medication Screening Complete  Transportation Screening Complete

## 2023-05-14 NOTE — Progress Notes (Signed)
Progress Note  6 Days Post-Op  Subjective: Resting comfortably, just had breakfast and ate over 70%. No acute changes.  Objective: Vital signs in last 24 hours: Temp:  [97.9 F (36.6 C)-98.7 F (37.1 C)] 98.3 F (36.8 C) (09/26 0537) Pulse Rate:  [90-100] 100 (09/26 0537) Resp:  [17-18] 18 (09/26 0537) BP: (119-149)/(67-74) 119/67 (09/26 0537) SpO2:  [92 %-95 %] 94 % (09/26 0537) Last BM Date : 05/13/23  Intake/Output from previous day: 09/25 0701 - 09/26 0700 In: 1320 [P.O.:1320] Out: 1900 [Urine:1900] Intake/Output this shift: Total I/O In: 360 [P.O.:360] Out: 200 [Urine:200]  PE: General: pleasant, WD, WN female who is sitting up in bed in NAD. Heart: regular, rate, and rhythm.   Lungs: CTAB, no wheezes, rhonchi, or rales noted.  Respiratory effort nonlabored Abd: soft, appropriately ttp, mild distention, +BS, honeycomb removed. Teegarden c/d/I without cellulitis or drainage.     Lab Results:  Recent Labs    05/12/23 0446 05/13/23 0449  WBC 6.9 8.3  HGB 9.4* 9.8*  HCT 29.3* 30.5*  PLT 144* 157   BMET Recent Labs    05/12/23 0446 05/13/23 0449  NA 141 135  K 3.4* 3.5  CL 105 101  CO2 29 28  GLUCOSE 102* 107*  BUN 13 14  CREATININE 0.66 0.63  CALCIUM 8.1* 8.0*   PT/INR No results for input(s): "LABPROT", "INR" in the last 72 hours. CMP     Component Value Date/Time   NA 135 05/13/2023 0449   K 3.5 05/13/2023 0449   CL 101 05/13/2023 0449   CO2 28 05/13/2023 0449   GLUCOSE 107 (H) 05/13/2023 0449   BUN 14 05/13/2023 0449   CREATININE 0.63 05/13/2023 0449   CALCIUM 8.0 (L) 05/13/2023 0449   PROT 5.5 (L) 05/06/2023 0500   ALBUMIN 3.0 (L) 05/06/2023 0500   AST 30 05/06/2023 0500   ALT 42 05/06/2023 0500   ALKPHOS 36 (L) 05/06/2023 0500   BILITOT 1.2 05/06/2023 0500   GFRNONAA >60 05/13/2023 0449   Lipase     Component Value Date/Time   LIPASE <10 (L) 05/02/2023 1621       Studies/Results: No results  found.  Anti-infectives: Anti-infectives (From admission, onward)    Start     Dose/Rate Route Frequency Ordered Stop   05/08/23 1530  cefOXitin (MEFOXIN) 2 g in sodium chloride 0.9 % 100 mL IVPB        2 g 200 mL/hr over 30 Minutes Intravenous STAT 05/08/23 1519 05/09/23 1530   05/03/23 1000  piperacillin-tazobactam (ZOSYN) IVPB 3.375 g  Status:  Discontinued        3.375 g 12.5 mL/hr over 240 Minutes Intravenous Every 8 hours 05/02/23 2213 05/04/23 1040   05/02/23 2200  oseltamivir (TAMIFLU) capsule 30 mg        30 mg Oral 2 times daily 05/02/23 2131 05/07/23 0845   05/02/23 1800  piperacillin-tazobactam (ZOSYN) IVPB 3.375 g        3.375 g 12.5 mL/hr over 240 Minutes Intravenous  Once 05/02/23 1759 05/03/23 0542        Assessment/Plan  SBO POD4 S/p ex-lap with extensive LOA - having BMs, tolerating soft diet.  - walked with mobility tech today  - mobilize as tolerated - stable for discharge from a surgical perspective. Post-op appointment arranged. Patient awaiting insurance auth for short-term SNF.   FEN: SOFT  VTE: LMWH ID: no current abx  - per TRH -  Influenza A, onset 9/12 Abnormal UA, asymptomatic  Thrombocytopenia  Pre-diabetes  AAA 2.8 cm  Bilateral pleural effusions, mild   LOS: 12 days     Adam Phenix, [facility] 05/14/2023, 10:07 AM Please see Amion for pager number during day hours 7:00am-4:30pm

## 2023-05-15 ENCOUNTER — Other Ambulatory Visit (HOSPITAL_COMMUNITY): Payer: Self-pay

## 2023-05-15 DIAGNOSIS — R112 Nausea with vomiting, unspecified: Secondary | ICD-10-CM

## 2023-05-15 DIAGNOSIS — N3000 Acute cystitis without hematuria: Secondary | ICD-10-CM

## 2023-05-15 DIAGNOSIS — K56609 Unspecified intestinal obstruction, unspecified as to partial versus complete obstruction: Secondary | ICD-10-CM | POA: Diagnosis not present

## 2023-05-15 DIAGNOSIS — D696 Thrombocytopenia, unspecified: Secondary | ICD-10-CM | POA: Diagnosis not present

## 2023-05-15 LAB — GLUCOSE, CAPILLARY
Glucose-Capillary: 110 mg/dL — ABNORMAL HIGH (ref 70–99)
Glucose-Capillary: 165 mg/dL — ABNORMAL HIGH (ref 70–99)

## 2023-05-15 MED ORDER — PANTOPRAZOLE SODIUM 40 MG PO TBEC: 40.0000 mg | DELAYED_RELEASE_TABLET | Freq: Every day | ORAL | 0 refills | Status: DC

## 2023-05-15 MED ORDER — ENSURE ENLIVE PO LIQD
237.0000 mL | Freq: Two times a day (BID) | ORAL | 12 refills | Status: AC
  Filled 2023-05-15: qty 237, 1d supply, fill #0

## 2023-05-15 MED ORDER — PANTOPRAZOLE SODIUM 40 MG PO TBEC
40.0000 mg | DELAYED_RELEASE_TABLET | Freq: Every day | ORAL | 0 refills | Status: AC
  Filled 2023-05-15: qty 30, 30d supply, fill #0

## 2023-05-15 MED ORDER — DOCUSATE SODIUM 100 MG PO CAPS
100.0000 mg | ORAL_CAPSULE | Freq: Two times a day (BID) | ORAL | Status: DC
  Filled 2023-05-15: qty 1

## 2023-05-15 MED ORDER — DOCUSATE SODIUM 100 MG PO CAPS: 100.0000 mg | ORAL_CAPSULE | Freq: Two times a day (BID) | ORAL | 0 refills | Status: DC

## 2023-05-15 MED ORDER — ENSURE ENLIVE PO LIQD: 237.0000 mL | Freq: Two times a day (BID) | ORAL | 12 refills | Status: DC

## 2023-05-15 MED ORDER — DOCUSATE SODIUM 100 MG PO CAPS
100.0000 mg | ORAL_CAPSULE | Freq: Two times a day (BID) | ORAL | 0 refills | Status: AC
  Filled 2023-05-15: qty 10, 5d supply, fill #0

## 2023-05-15 NOTE — Progress Notes (Signed)
Reviewed written discharge instructions with patient's family. All questions answered. Family verbalized understanding. Discharged via wheelchair with all belongings... in stable condition.

## 2023-05-15 NOTE — Discharge Summary (Signed)
Physician Discharge Summary   Patient: Patricia Peters MRN: [id number] DOB: Jul 02, 1936  Admit date:     05/02/2023  Discharge date: 05/15/23  Discharge Physician: Arnetha Courser   PCP: Ollen Bowl, MD   Recommendations at discharge:  Please obtain CBC and BMP and follow-up Follow-up with general surgery Follow-up with primary care provider  Discharge Diagnoses: Principal Problem:   Small bowel obstruction (HCC) Active Problems:   Anxiety   Influenza A   Infrarenal abdominal aortic aneurysm (AAA) without rupture (HCC)   Hyperglycemia   Dehydration   Hypoxia   Thrombocytopenia (HCC)   CKD (chronic kidney disease) stage 2, GFR 60-89 ml/min   Memory changes   Malnutrition of moderate degree   Nausea and vomiting   Acute cystitis without hematuria   Hospital Course: Patricia Peters is a 87 y.o. female with a history of cholecystectomy.  Patient presented secondary to extreme fatigue, upper abdominal pain, nausea, vomiting and diarrhea. Patient was found to have evidence of influenza A infection in addition to SBO with transition point. General surgery consulted with recommendation for conservative management. NG tube placed. Patient started on oseltamivir for influenza A treatment.Patient followed by general surgery.  Patient overall has failed to improve with ongoing abdominal distention. Respiratory status has remained stable although cough, chest x-ray has been clear no pneumonia, completed Tamiflu. 9/20: Underwent ex laparotomy with extensive lysis of adhesion 9/23: NG tube discontinued, advanced to soft diet 9/25: Medically stable, awaiting insurance authorization for SNF. 9/26: Patient continued to have improved mobility, insurance authorization denied even with peer to peer stating that she has to go to mobility for a short-term rehab.  Likely be going home tomorrow with home health services.  9/27: Patient remained stable and is being discharged home with maximum home  health services to help as insurance declined her rehab.  She was instructed to keep herself well-hydrated and avoid constipation. Appointments were provided for follow-up with general surgery.  Patient will continue on current medications and need to have a close follow-up with her providers for further recommendations.      Consultants: General Surgery Procedures performed: Exploratory laparotomy Disposition: Home health Diet recommendation:  Discharge Diet Orders (From admission, onward)     Start     Ordered   05/15/23 0000  Diet - low sodium heart healthy        05/15/23 1049           Regular diet DISCHARGE MEDICATION: Allergies as of 05/15/2023       Reactions   Ciprofloxacin Hives   Sulfa Antibiotics Other (See Comments)   dizzy        Medication List     TAKE these medications    acetaminophen 325 MG tablet Commonly known as: TYLENOL Take 325 mg by mouth as needed for headache or moderate pain.   brompheniramine-pseudoephedrine-DM 30-2-10 MG/5ML syrup Take by mouth as needed (cough).   docusate sodium 100 MG capsule Commonly known as: COLACE Take 1 capsule (100 mg total) by mouth 2 (two) times daily.   famotidine 20 MG tablet Commonly known as: PEPCID Take 20 mg by mouth as needed for heartburn or indigestion.   feeding supplement Liqd Take 237 mLs by mouth 2 (two) times daily between meals.   ibuprofen 200 MG tablet Commonly known as: ADVIL Take 200 mg by mouth as needed for moderate pain.   lidocaine 5 % Commonly known as: LIDODERM Place 1 patch onto the skin daily. Remove & Discard patch  within 12 hours or as directed by MD   pantoprazole 40 MG tablet Commonly known as: PROTONIX Take 1 tablet (40 mg total) by mouth daily.        Contact information for follow-up providers     [facility], [facility]. Go on 05/22/2023.   Specialty: General Surgery Why: 9 AM for staple removal, this is an RN visit Contact information: [address] [place] [address] [phone]         Griselda Miner, MD. Go on 06/04/2023.   Specialty: General Surgery Why: 2:50 PM, please arrive 15 min prior to appointment to check in Contact information: [address] [place] [address] [phone]         [facility] Follow up.   Why: [facility] - will provide home therapy follow up visits Contact information: [address] [place] [address] [phone]              Contact information for after-discharge care     Destination     [facility] Preferred SNF .   Service: Skilled Nursing Contact information: [address] [place] [address] [phone]                    Discharge Exam: Ceasar Mons Weights   05/02/23 1546 05/08/23 1342 05/11/23 1523  Weight: 68.9 kg 68.9 kg 67.8 kg   General.  Frail elderly lady, in no acute distress. Pulmonary.  Lungs clear bilaterally, normal respiratory effort. CV.  Regular rate and rhythm, no JVD, rub or murmur. Abdomen.  Soft, nontender, nondistended, BS positive. CNS.  Alert and oriented .  No focal neurologic deficit. Extremities.  No edema, no cyanosis, pulses intact and symmetrical. Psychiatry.  Judgment and insight appears normal.   Condition at discharge: stable  The results of significant diagnostics from this hospitalization (including imaging, microbiology, ancillary and laboratory) are listed below for reference.   Imaging Studies: DG Abd Portable 1V  Result Date: 05/08/2023 CLINICAL DATA:  Small bowel obstruction. EXAM: PORTABLE ABDOMEN - 1 VIEW COMPARISON:  Radiograph yesterday. FINDINGS: Progressive enteric contrast in the colon. There is persistent gaseous small bowel in the central and right abdomen, small bowel distention up to 5 cm. Tip of the enteric tube below the diaphragm in the stomach. Right upper quadrant surgical  clips. IMPRESSION: Persistent gaseous small bowel distention up to 5 cm. Progressive enteric contrast in the colon. Electronically Signed   By: Narda Rutherford M.D.   On: 05/08/2023 08:53   DG Chest Port 1 View  Result Date: 05/07/2023 CLINICAL DATA:  Cough EXAM: PORTABLE CHEST 1 VIEW COMPARISON:  None Available. FINDINGS: The enteric catheter side hole is at the level of the GE junction. The heart is enlarged.  The upper mediastinal contours are normal. There is no focal consolidation or pulmonary edema. There is no pleural effusion or pneumothorax There is no acute osseous abnormality. IMPRESSION: 1. Enteric catheter sidehole at the level of the GE junction. Consider advancement by approximately 3 cm. 2. Cardiomegaly. 3. No focal consolidation or pleural effusion. Electronically Signed   By: Lesia Hausen M.D.   On: 05/07/2023 13:43   DG Abd Portable 1V  Result Date: 05/07/2023 CLINICAL DATA:  Follow-up small bowel obstruction EXAM: PORTABLE ABDOMEN - 1 VIEW COMPARISON:  KUB 1 day prior FINDINGS: The enteric catheter tip and sidehole projects over the stomach. There is no definite free intraperitoneal  air. Gaseous distention of the small bowel in the midabdomen is similar to the prior study. Contrast is seen in the right colon extending to the transverse colon, similar to the prior study. Abdominal surgical clips are again noted. IMPRESSION: No significant change in gas distended small bowel in the midabdomen with contrast again seen in the ascending and transverse colon. Electronically Signed   By: Lesia Hausen M.D.   On: 05/07/2023 13:01   DG Abd Portable 1V  Result Date: 05/06/2023 CLINICAL DATA:  Small-bowel obstruction EXAM: PORTABLE ABDOMEN - 1 VIEW COMPARISON:  05/05/2023 FINDINGS: Gastric tube remains in decompressed stomach, probably across the pylorus into the duodenal bulb. Gas dilated left abdominal small bowel loops up to 4.6 cm diameter, appearing similar in number and degree of  dilatation. Contrast material noted in decompressed colon. Cholecystectomy clips. No definite abnormal abdominal calcifications. Regional bones unremarkable. IMPRESSION: 1. Stable appearance of presumed mid/distal small bowel obstruction Electronically Signed   By: Corlis Leak M.D.   On: 05/06/2023 09:33   DG Abd Portable 1V  Result Date: 05/05/2023 CLINICAL DATA:  [id number] with small bowel obstruction.  Follow-up. EXAM: PORTABLE ABDOMEN - 1 VIEW COMPARISON:  Portable study yesterday at 3:23 p.m. FINDINGS: 5:06 a.m. NGT tip is in the right upper quadrant either in the most distal gastric antrum or in the duodenal bulb. It is probably in the distal antrum based on the CT of 05/02/2023. There is persistent dilatation of small bowel, maximum caliber in the left lower quadrant 4.5 cm, previously 5.1 cm, mildly improved. There is scattered gas and stool in the large bowel at least into the distal sigmoid segment. There is increased faint contrast opacification in the ascending and transverse colon. There is no supine evidence of free air. Right upper quadrant surgical clips. IMPRESSION: 1. NGT tip is in the right upper quadrant either in the most distal gastric antrum or in the duodenal bulb. 2. Persistent dilatation of small bowel, maximum caliber in the left lower quadrant 4.5 cm, previously 5.1 cm. 3. Increased faint contrast opacification in the ascending and transverse colon. 4. No other change. Electronically Signed   By: Almira Bar M.D.   On: 05/05/2023 06:41   DG Abd Portable 1V  Result Date: 05/04/2023 CLINICAL DATA:  Small-bowel obstruction EXAM: PORTABLE ABDOMEN - 1 VIEW COMPARISON:  05/03/2023, 05/02/2023 FINDINGS: Enteric tube remains in place. Persistently dilated loops of small bowel, predominantly within the left hemiabdomen. Progressive transit of contrast through the small bowel. Contrast has become more dilute compared to the previous study. Contrast has not definitively reached the large  bowel, based on position of the cecum in the previous CT. IMPRESSION: Persistently dilated loops of small bowel, predominantly within the left hemiabdomen. Progressive transit of contrast through the small bowel. Contrast has not definitively reached the large bowel. Electronically Signed   By: Duanne Guess D.O.   On: 05/04/2023 19:24   DG Abd Portable 1V-Small Bowel Obstruction Protocol-initial, 8 hr delay  Result Date: 05/03/2023 CLINICAL DATA:  Small-bowel obstruction EXAM: PORTABLE ABDOMEN - 1 VIEW COMPARISON:  Film from earlier in the same day. FINDINGS: Gastric catheter is again identified. Administered contrast now is distributed throughout multiple dilated loops of small bowel. No definitive colonic contrast is seen. IMPRESSION: Contrast scattered throughout multiple dilated loops of small bowel. No colonic contrast is seen. 24 hour follow-up film is recommended. Electronically Signed   By: Alcide Clever M.D.   On: 05/03/2023 19:44   DG Abd 1 View  Result Date: 05/03/2023 CLINICAL DATA:  Small bowel obstruction. EXAM: ABDOMEN - 1 VIEW COMPARISON:  CT scan from 1 day prior. FINDINGS: Contrast material opacifies the distal stomach and duodenum. Transverse duodenum is dilated up to 5.5 cm. Contrast material has not crossed the midline. Diffuse gaseous small bowel distention evident. IMPRESSION: 1. Contrast material opacifies the distal stomach and dilated duodenum. Duodenal contrast material has not yet passed the midline. 2. Diffuse gaseous small bowel distention. Electronically Signed   By: Kennith Center M.D.   On: 05/03/2023 11:30   CT ABDOMEN PELVIS W CONTRAST  Result Date: 05/02/2023 CLINICAL DATA:  Abdominal pain, vomiting, diarrhea EXAM: CT ABDOMEN AND PELVIS WITH CONTRAST TECHNIQUE: Multidetector CT imaging of the abdomen and pelvis was performed using the standard protocol following bolus administration of intravenous contrast. RADIATION DOSE REDUCTION: This exam was performed according  to the departmental dose-optimization program which includes automated exposure control, adjustment of the mA and/or kV according to patient size and/or use of iterative reconstruction technique. CONTRAST:  70mL OMNIPAQUE IOHEXOL 300 MG/ML  SOLN COMPARISON:  None Available. FINDINGS: Lower chest: Small right and trace left pleural effusions. Associated right basilar atelectasis. Hepatobiliary: Subcentimeter hepatic cysts. Status post cholecystectomy. Mild central intrahepatic ductal dilatation. Common duct measures 8 mm, within normal limits. Pancreas: Within normal limits. Spleen: Calcified splenic granulomata. Adrenals/Urinary Tract: Adrenal glands are within normal limits. Bilateral renal cysts, including a dominant 6.2 cm simple left lower pole renal cyst (series 2/image 42), benign (Bosniak I). No follow-up is recommended. No hydronephrosis. Bladder is within normal limits. Stomach/Bowel: Stomach is within normal limits. Multiple dilated loops of small bowel the left mid abdomen, suggesting small bowel obstruction. Multiple decompressed loops in the right mid/lower abdomen (for example, series 6/image 62) in an area of mesenteric stranding/fluid (series 2/image 50), suggesting a possible internal hernia. While an adjacent loop of small bowel is mildly thick-walled (series 2/image 2), there is no pneumatosis. Enhancement/inflammation along the medial cecum (series 2/image 61), without discrete/visualized appendix. Colon is otherwise within normal limits. Vascular/Lymphatic: Saccular outpouching along the right lateral aspect of the infrarenal abdominal aorta (coronal image 55), suggesting a small saccular aneurysm, measuring up to 2.8 cm in maximal diameter. Atherosclerotic calcifications of the abdominal aorta and branch vessels. No suspicious abdominopelvic lymphadenopathy. Reproductive: Uterus is within normal limits. No adnexal masses. Other: Small to moderate abdominopelvic ascites. No free air.  Musculoskeletal: Mild degenerative changes of the thoracic spine. IMPRESSION: Small bowel obstruction with transition in the right mid/lower abdomen. Possible underlying internal hernia, although equivocal. Surgical consultation is suggested. Enhancement/inflammation along the medial cecum, without discrete/visualized appendix. If this patient has had a prior appendectomy, this appearance would be worrisome for cecal mass/cancer. However, in the appropriate clinical setting, acute appendicitis is also possible. Small to moderate abdominopelvic ascites. No pneumatosis or free air. Small right and trace left pleural effusions. 2.8 cm saccular infrarenal abdominal aortic aneurysm. Recommend referral to a vascular specialist.This recommendation follows ACR consensus guidelines: White Paper of the ACR Incidental Findings Committee II on Vascular Findings. J Am Coll Radiol 2013; 10:789-794. Aortic Atherosclerosis (ICD10-I70.0). Electronically Signed   By: Charline Bills M.D.   On: 05/02/2023 17:41    Microbiology: Results for orders placed or performed during the hospital encounter of 05/02/23  Resp panel by RT-PCR (RSV, Flu A&B, Covid) Anterior Nasal Swab     Status: Abnormal   Collection Time: 05/02/23  3:48 PM   Specimen: Anterior Nasal Swab  Result Value Ref Range Status   SARS  Coronavirus 2 by RT PCR NEGATIVE NEGATIVE Final    Comment: (NOTE) SARS-CoV-2 target nucleic acids are NOT DETECTED.  The SARS-CoV-2 RNA is generally detectable in upper respiratory specimens during the acute phase of infection. The lowest concentration of SARS-CoV-2 viral copies this assay can detect is 138 copies/mL. A negative result does not preclude SARS-Cov-2 infection and should not be used as the sole basis for treatment or other patient management decisions. A negative result may occur with  improper specimen collection/handling, submission of specimen other than nasopharyngeal swab, presence of viral mutation(s)  within the areas targeted by this assay, and inadequate number of viral copies(<138 copies/mL). A negative result must be combined with clinical observations, patient history, and epidemiological information. The expected result is Negative.  Fact Sheet for Patients:  BloggerCourse.com  Fact Sheet for Healthcare Providers:  SeriousBroker.it  This test is no t yet approved or cleared by the [place] FDA and  has been authorized for detection and/or diagnosis of SARS-CoV-2 by FDA under an Emergency Use Authorization (EUA). This EUA will remain  in effect (meaning this test can be used) for the duration of the COVID-19 declaration under Section 564(b)(1) of the Act, 21 U.S.C.section 360bbb-3(b)(1), unless the authorization is terminated  or revoked sooner.       Influenza A by PCR POSITIVE (A) NEGATIVE Final   Influenza B by PCR NEGATIVE NEGATIVE Final    Comment: (NOTE) The Xpert Xpress SARS-CoV-2/FLU/RSV plus assay is intended as an aid in the diagnosis of influenza from Nasopharyngeal swab specimens and should not be used as a sole basis for treatment. Nasal washings and aspirates are unacceptable for Xpert Xpress SARS-CoV-2/FLU/RSV testing.  Fact Sheet for Patients: BloggerCourse.com  Fact Sheet for Healthcare Providers: SeriousBroker.it  This test is not yet approved or cleared by the [place] FDA and has been authorized for detection and/or diagnosis of SARS-CoV-2 by FDA under an Emergency Use Authorization (EUA). This EUA will remain in effect (meaning this test can be used) for the duration of the COVID-19 declaration under Section 564(b)(1) of the Act, 21 U.S.C. section 360bbb-3(b)(1), unless the authorization is terminated or revoked.     Resp Syncytial Virus by PCR NEGATIVE NEGATIVE Final    Comment: (NOTE) Fact Sheet for  Patients: BloggerCourse.com  Fact Sheet for Healthcare Providers: SeriousBroker.it  This test is not yet approved or cleared by the [place] FDA and has been authorized for detection and/or diagnosis of SARS-CoV-2 by FDA under an Emergency Use Authorization (EUA). This EUA will remain in effect (meaning this test can be used) for the duration of the COVID-19 declaration under Section 564(b)(1) of the Act, 21 U.S.C. section 360bbb-3(b)(1), unless the authorization is terminated or revoked.  Performed at [facility], [address], [place], [place] [address]   Urine Culture     Status: Abnormal   Collection Time: 05/02/23  6:05 PM   Specimen: Urine, Clean Catch  Result Value Ref Range Status   Specimen Description   Final    URINE, CLEAN CATCH Performed at [facility], [address] [place], [place] [address]    Special Requests   Final    NONE Performed at [facility], [address], [place], [place] [address]    Culture MULTIPLE SPECIES PRESENT, SUGGEST RECOLLECTION (A)  Final   Report Status 05/03/2023 FINAL  Final  Blood culture (routine x 2)     Status: None   Collection Time: 05/02/23  8:30 PM  Specimen: BLOOD RIGHT ARM  Result Value Ref Range Status   Specimen Description   Final    BLOOD RIGHT ARM Performed at [facility], [address], [place], [place] [address]    Special Requests   Final    BOTTLES DRAWN AEROBIC AND ANAEROBIC Blood Culture adequate volume Performed at [facility], [address] [place], [place] [address]    Culture   Final    NO GROWTH 5 DAYS Performed at [facility], [address] [place], [place] [address]    Report Status 05/08/2023 FINAL  Final  Blood culture (routine x 2)     Status: None   Collection Time: 05/02/23  8:39 PM   Specimen: BLOOD RIGHT ARM  Result Value Ref Range  Status   Specimen Description   Final    BLOOD RIGHT ARM Performed at [facility], [address], [place], [place] [address]    Special Requests   Final    BOTTLES DRAWN AEROBIC ONLY Blood Culture adequate volume Performed at [facility], [address], [place], [place] [address]    Culture   Final    NO GROWTH 5 DAYS Performed at [facility], [address] [place], [place] [address]    Report Status 05/08/2023 FINAL  Final  Surgical pcr screen     Status: None   Collection Time: 05/07/23  8:23 PM   Specimen: Nasal Mucosa; Nasal Swab  Result Value Ref Range Status   MRSA, PCR NEGATIVE NEGATIVE Final   Staphylococcus aureus NEGATIVE NEGATIVE Final    Comment: (NOTE) The Xpert SA Assay (FDA approved for NASAL specimens in patients 10 years of age and older), is one component of a comprehensive surveillance program. It is not intended to diagnose infection nor to guide or monitor treatment. Performed at [facility], [address], [place], [place] [address]     Labs: CBC: Recent Labs  Lab 05/09/23 0458 05/10/23 0527 05/11/23 0429 05/12/23 0446 05/13/23 0449  WBC 8.8 8.0 8.6 6.9 8.3  HGB 11.5* 10.1* 10.0* 9.4* 9.8*  HCT 35.3* 32.0* 31.8* 29.3* 30.5*  MCV 92.9 93.6 93.8 93.0 90.5  PLT 141* 145* 144* 144* 157   Basic Metabolic Panel: Recent Labs  Lab 05/09/23 0458 05/10/23 0527 05/11/23 0429 05/12/23 0446 05/13/23 0449  NA 137 140 140 141 135  K 3.7 3.8 3.7 3.4* 3.5  CL 104 110 105 105 101  CO2 27 27 28 29 28   GLUCOSE 201* 146* 140* 102* 107*  BUN 12 13 11 13 14   CREATININE 0.63 0.61 0.59 0.66 0.63  CALCIUM 8.0* 7.9* 8.3* 8.1* 8.0*   Liver Function Tests: No results for input(s): "AST", "ALT", "ALKPHOS", "BILITOT", "PROT", "ALBUMIN" in the last 168 hours. CBG: Recent Labs  Lab 05/14/23 0533 05/14/23 1124 05/14/23 1728 05/14/23 2319 05/15/23 0551  GLUCAP 109* 121* 126* 128* 110*    Discharge  time spent: greater than 30 minutes.  This record has been created using Conservation officer, historic buildings. Errors have been sought and corrected,but may not always be located. Such creation errors do not reflect on the standard of care.   Signed: Arnetha Courser, MD [facility] 05/15/2023

## 2023-05-15 NOTE — Progress Notes (Signed)
AVS reviewed w/ patient's daughter Burnett Harry via the phone. [place]. She is arranging for a ride home for patient at 3pm. Current ride service on patient's insurance is not an option due to poor service yesterday per Burnett Harry, "they left my dad standing in the rain" Pt to receive Prevnar vaccine today-  Shelly verbalized an understanding of plan of care and AVS.This RN also checking to see if home discharge meds can be switched to [employer]. Feeding supplement may not be available, but home health aide could pick up tomorrow

## 2023-05-15 NOTE — Progress Notes (Signed)
1550: Call to [facility] w/ an update. Prevnar was not administered at [facility] prior to discharge to home. Home medications from [facility] pharmacy were sent home with patient. Feeding supplement may be obtained OTC. Shelly will follow up with Patricia Peters's PCP regarding  vaccines needed for the fall

## 2023-05-15 NOTE — Care Management Important Message (Signed)
Important Message  Patient Details  Name: GENORA ARP MRN: [id number] Date of Birth: 21-Feb-1936   Important Message Given:  Yes     Mardene Sayer 05/15/2023, 11:28 AM

## 2023-05-15 NOTE — Progress Notes (Signed)
Progress Note  7 Days Post-Op  Subjective: No acute changes - ate some pancakes and omelette for breakfast. Now having coffee and juice. Having bowel function.   Per chart review and patient report, insurance denied SNF  Objective: Vital signs in last 24 hours: Temp:  [98.2 F (36.8 C)-98.7 F (37.1 C)] 98.2 F (36.8 C) (09/27 0413) Pulse Rate:  [88-95] 88 (09/27 0413) Resp:  [18] 18 (09/27 0413) BP: (127-147)/(75-79) 147/75 (09/27 0413) SpO2:  [92 %] 92 % (09/27 0413) Last BM Date : 05/13/23  Intake/Output from previous day: 09/26 0701 - 09/27 0700 In: 1200 [P.O.:1200] Out: 1150 [Urine:1150] Intake/Output this shift: Total I/O In: -  Out: 200 [Urine:200]  PE: General: pleasant, WD, WN female who is sitting up in bed in NAD. Heart: regular, rate, and rhythm.   Lungs: CTAB, no wheezes, rhonchi, or rales noted.  Respiratory effort nonlabored Abd: soft, appropriately ttp, mild distention, +BS, honeycomb removed. Dimascio c/d/I without cellulitis or drainage.     Lab Results:  Recent Labs    05/13/23 0449  WBC 8.3  HGB 9.8*  HCT 30.5*  PLT 157   BMET Recent Labs    05/13/23 0449  NA 135  K 3.5  CL 101  CO2 28  GLUCOSE 107*  BUN 14  CREATININE 0.63  CALCIUM 8.0*   PT/INR No results for input(s): "LABPROT", "INR" in the last 72 hours. CMP     Component Value Date/Time   NA 135 05/13/2023 0449   K 3.5 05/13/2023 0449   CL 101 05/13/2023 0449   CO2 28 05/13/2023 0449   GLUCOSE 107 (H) 05/13/2023 0449   BUN 14 05/13/2023 0449   CREATININE 0.63 05/13/2023 0449   CALCIUM 8.0 (L) 05/13/2023 0449   PROT 5.5 (L) 05/06/2023 0500   ALBUMIN 3.0 (L) 05/06/2023 0500   AST 30 05/06/2023 0500   ALT 42 05/06/2023 0500   ALKPHOS 36 (L) 05/06/2023 0500   BILITOT 1.2 05/06/2023 0500   GFRNONAA >60 05/13/2023 0449   Lipase     Component Value Date/Time   LIPASE <10 (L) 05/02/2023 1621       Studies/Results: No results  found.  Anti-infectives: Anti-infectives (From admission, onward)    Start     Dose/Rate Route Frequency Ordered Stop   05/08/23 1530  cefOXitin (MEFOXIN) 2 g in sodium chloride 0.9 % 100 mL IVPB        2 g 200 mL/hr over 30 Minutes Intravenous STAT 05/08/23 1519 05/09/23 1530   05/03/23 1000  piperacillin-tazobactam (ZOSYN) IVPB 3.375 g  Status:  Discontinued        3.375 g 12.5 mL/hr over 240 Minutes Intravenous Every 8 hours 05/02/23 2213 05/04/23 1040   05/02/23 2200  oseltamivir (TAMIFLU) capsule 30 mg        30 mg Oral 2 times daily 05/02/23 2131 05/07/23 0845   05/02/23 1800  piperacillin-tazobactam (ZOSYN) IVPB 3.375 g        3.375 g 12.5 mL/hr over 240 Minutes Intravenous  Once 05/02/23 1759 05/03/23 0542        Assessment/Plan  SBO POD6 S/p ex-lap with extensive LOA - having BMs, tolerating soft diet.  - walked with mobility tech today  - mobilize as tolerated - stable for discharge from a surgical perspective. Post-op appointment arranged.  FEN: SOFT  VTE: LMWH ID: no current abx  - per [facility] -  Influenza A, onset 9/12 Abnormal UA, asymptomatic  Thrombocytopenia  Pre-diabetes  AAA 2.8 cm  Bilateral pleural effusions, mild   LOS: 13 days     Adam Phenix, [facility] 05/15/2023, 9:14 AM Please see Amion for pager number during day hours 7:00am-4:30pm

## 2023-05-16 ENCOUNTER — Other Ambulatory Visit (HOSPITAL_COMMUNITY): Payer: Self-pay

## 2023-05-16 DIAGNOSIS — N3000 Acute cystitis without hematuria: Secondary | ICD-10-CM | POA: Diagnosis not present

## 2023-05-16 DIAGNOSIS — I7143 Infrarenal abdominal aortic aneurysm, without rupture: Secondary | ICD-10-CM | POA: Diagnosis not present

## 2023-05-16 DIAGNOSIS — E440 Moderate protein-calorie malnutrition: Secondary | ICD-10-CM | POA: Diagnosis not present

## 2023-05-16 DIAGNOSIS — Z48815 Encounter for surgical aftercare following surgery on the digestive system: Secondary | ICD-10-CM | POA: Diagnosis not present

## 2023-05-16 DIAGNOSIS — K56609 Unspecified intestinal obstruction, unspecified as to partial versus complete obstruction: Secondary | ICD-10-CM | POA: Diagnosis not present

## 2023-05-16 DIAGNOSIS — N182 Chronic kidney disease, stage 2 (mild): Secondary | ICD-10-CM | POA: Diagnosis not present

## 2023-05-16 DIAGNOSIS — D649 Anemia, unspecified: Secondary | ICD-10-CM | POA: Diagnosis not present

## 2023-05-16 DIAGNOSIS — R0902 Hypoxemia: Secondary | ICD-10-CM | POA: Diagnosis not present

## 2023-05-16 DIAGNOSIS — J09X2 Influenza due to identified novel influenza A virus with other respiratory manifestations: Secondary | ICD-10-CM | POA: Diagnosis not present

## 2023-05-19 DIAGNOSIS — K56609 Unspecified intestinal obstruction, unspecified as to partial versus complete obstruction: Secondary | ICD-10-CM | POA: Diagnosis not present

## 2023-05-19 DIAGNOSIS — E440 Moderate protein-calorie malnutrition: Secondary | ICD-10-CM | POA: Diagnosis not present

## 2023-05-19 DIAGNOSIS — N182 Chronic kidney disease, stage 2 (mild): Secondary | ICD-10-CM | POA: Diagnosis not present

## 2023-05-19 DIAGNOSIS — N3000 Acute cystitis without hematuria: Secondary | ICD-10-CM | POA: Diagnosis not present

## 2023-05-19 DIAGNOSIS — R0902 Hypoxemia: Secondary | ICD-10-CM | POA: Diagnosis not present

## 2023-05-19 DIAGNOSIS — I7143 Infrarenal abdominal aortic aneurysm, without rupture: Secondary | ICD-10-CM | POA: Diagnosis not present

## 2023-05-19 DIAGNOSIS — Z48815 Encounter for surgical aftercare following surgery on the digestive system: Secondary | ICD-10-CM | POA: Diagnosis not present

## 2023-05-19 DIAGNOSIS — D649 Anemia, unspecified: Secondary | ICD-10-CM | POA: Diagnosis not present

## 2023-05-19 DIAGNOSIS — J09X2 Influenza due to identified novel influenza A virus with other respiratory manifestations: Secondary | ICD-10-CM | POA: Diagnosis not present

## 2023-05-20 DIAGNOSIS — K56609 Unspecified intestinal obstruction, unspecified as to partial versus complete obstruction: Secondary | ICD-10-CM | POA: Diagnosis not present

## 2023-05-20 DIAGNOSIS — N3000 Acute cystitis without hematuria: Secondary | ICD-10-CM | POA: Diagnosis not present

## 2023-05-20 DIAGNOSIS — R0902 Hypoxemia: Secondary | ICD-10-CM | POA: Diagnosis not present

## 2023-05-20 DIAGNOSIS — D649 Anemia, unspecified: Secondary | ICD-10-CM | POA: Diagnosis not present

## 2023-05-20 DIAGNOSIS — J09X2 Influenza due to identified novel influenza A virus with other respiratory manifestations: Secondary | ICD-10-CM | POA: Diagnosis not present

## 2023-05-20 DIAGNOSIS — Z48815 Encounter for surgical aftercare following surgery on the digestive system: Secondary | ICD-10-CM | POA: Diagnosis not present

## 2023-05-20 DIAGNOSIS — N182 Chronic kidney disease, stage 2 (mild): Secondary | ICD-10-CM | POA: Diagnosis not present

## 2023-05-20 DIAGNOSIS — E440 Moderate protein-calorie malnutrition: Secondary | ICD-10-CM | POA: Diagnosis not present

## 2023-05-20 DIAGNOSIS — I7143 Infrarenal abdominal aortic aneurysm, without rupture: Secondary | ICD-10-CM | POA: Diagnosis not present

## 2023-05-21 DIAGNOSIS — J09X2 Influenza due to identified novel influenza A virus with other respiratory manifestations: Secondary | ICD-10-CM | POA: Diagnosis not present

## 2023-05-21 DIAGNOSIS — E440 Moderate protein-calorie malnutrition: Secondary | ICD-10-CM | POA: Diagnosis not present

## 2023-05-21 DIAGNOSIS — I7143 Infrarenal abdominal aortic aneurysm, without rupture: Secondary | ICD-10-CM | POA: Diagnosis not present

## 2023-05-21 DIAGNOSIS — R0902 Hypoxemia: Secondary | ICD-10-CM | POA: Diagnosis not present

## 2023-05-21 DIAGNOSIS — Z48815 Encounter for surgical aftercare following surgery on the digestive system: Secondary | ICD-10-CM | POA: Diagnosis not present

## 2023-05-21 DIAGNOSIS — N3000 Acute cystitis without hematuria: Secondary | ICD-10-CM | POA: Diagnosis not present

## 2023-05-21 DIAGNOSIS — N182 Chronic kidney disease, stage 2 (mild): Secondary | ICD-10-CM | POA: Diagnosis not present

## 2023-05-21 DIAGNOSIS — D649 Anemia, unspecified: Secondary | ICD-10-CM | POA: Diagnosis not present

## 2023-05-21 DIAGNOSIS — K56609 Unspecified intestinal obstruction, unspecified as to partial versus complete obstruction: Secondary | ICD-10-CM | POA: Diagnosis not present

## 2023-05-22 DIAGNOSIS — N3000 Acute cystitis without hematuria: Secondary | ICD-10-CM | POA: Diagnosis not present

## 2023-05-22 DIAGNOSIS — K56609 Unspecified intestinal obstruction, unspecified as to partial versus complete obstruction: Secondary | ICD-10-CM | POA: Diagnosis not present

## 2023-05-22 DIAGNOSIS — J09X2 Influenza due to identified novel influenza A virus with other respiratory manifestations: Secondary | ICD-10-CM | POA: Diagnosis not present

## 2023-05-22 DIAGNOSIS — Z48815 Encounter for surgical aftercare following surgery on the digestive system: Secondary | ICD-10-CM | POA: Diagnosis not present

## 2023-05-22 DIAGNOSIS — D649 Anemia, unspecified: Secondary | ICD-10-CM | POA: Diagnosis not present

## 2023-05-22 DIAGNOSIS — N182 Chronic kidney disease, stage 2 (mild): Secondary | ICD-10-CM | POA: Diagnosis not present

## 2023-05-22 DIAGNOSIS — E440 Moderate protein-calorie malnutrition: Secondary | ICD-10-CM | POA: Diagnosis not present

## 2023-05-22 DIAGNOSIS — R0902 Hypoxemia: Secondary | ICD-10-CM | POA: Diagnosis not present

## 2023-05-22 DIAGNOSIS — I7143 Infrarenal abdominal aortic aneurysm, without rupture: Secondary | ICD-10-CM | POA: Diagnosis not present

## 2023-05-25 ENCOUNTER — Other Ambulatory Visit (HOSPITAL_COMMUNITY): Payer: Self-pay

## 2023-05-25 DIAGNOSIS — J09X2 Influenza due to identified novel influenza A virus with other respiratory manifestations: Secondary | ICD-10-CM | POA: Diagnosis not present

## 2023-05-25 DIAGNOSIS — R0902 Hypoxemia: Secondary | ICD-10-CM | POA: Diagnosis not present

## 2023-05-25 DIAGNOSIS — N182 Chronic kidney disease, stage 2 (mild): Secondary | ICD-10-CM | POA: Diagnosis not present

## 2023-05-25 DIAGNOSIS — N3000 Acute cystitis without hematuria: Secondary | ICD-10-CM | POA: Diagnosis not present

## 2023-05-25 DIAGNOSIS — K56609 Unspecified intestinal obstruction, unspecified as to partial versus complete obstruction: Secondary | ICD-10-CM | POA: Diagnosis not present

## 2023-05-25 DIAGNOSIS — Z48815 Encounter for surgical aftercare following surgery on the digestive system: Secondary | ICD-10-CM | POA: Diagnosis not present

## 2023-05-25 DIAGNOSIS — I7143 Infrarenal abdominal aortic aneurysm, without rupture: Secondary | ICD-10-CM | POA: Diagnosis not present

## 2023-05-25 DIAGNOSIS — E440 Moderate protein-calorie malnutrition: Secondary | ICD-10-CM | POA: Diagnosis not present

## 2023-05-25 DIAGNOSIS — D649 Anemia, unspecified: Secondary | ICD-10-CM | POA: Diagnosis not present

## 2023-05-26 DIAGNOSIS — N182 Chronic kidney disease, stage 2 (mild): Secondary | ICD-10-CM | POA: Diagnosis not present

## 2023-05-26 DIAGNOSIS — I7143 Infrarenal abdominal aortic aneurysm, without rupture: Secondary | ICD-10-CM | POA: Diagnosis not present

## 2023-05-26 DIAGNOSIS — N3000 Acute cystitis without hematuria: Secondary | ICD-10-CM | POA: Diagnosis not present

## 2023-05-26 DIAGNOSIS — D649 Anemia, unspecified: Secondary | ICD-10-CM | POA: Diagnosis not present

## 2023-05-26 DIAGNOSIS — K56609 Unspecified intestinal obstruction, unspecified as to partial versus complete obstruction: Secondary | ICD-10-CM | POA: Diagnosis not present

## 2023-05-26 DIAGNOSIS — J09X2 Influenza due to identified novel influenza A virus with other respiratory manifestations: Secondary | ICD-10-CM | POA: Diagnosis not present

## 2023-05-26 DIAGNOSIS — R0902 Hypoxemia: Secondary | ICD-10-CM | POA: Diagnosis not present

## 2023-05-26 DIAGNOSIS — E440 Moderate protein-calorie malnutrition: Secondary | ICD-10-CM | POA: Diagnosis not present

## 2023-05-26 DIAGNOSIS — Z48815 Encounter for surgical aftercare following surgery on the digestive system: Secondary | ICD-10-CM | POA: Diagnosis not present

## 2023-05-27 DIAGNOSIS — I7143 Infrarenal abdominal aortic aneurysm, without rupture: Secondary | ICD-10-CM | POA: Diagnosis not present

## 2023-05-27 DIAGNOSIS — Z48815 Encounter for surgical aftercare following surgery on the digestive system: Secondary | ICD-10-CM | POA: Diagnosis not present

## 2023-05-27 DIAGNOSIS — N3000 Acute cystitis without hematuria: Secondary | ICD-10-CM | POA: Diagnosis not present

## 2023-05-27 DIAGNOSIS — R0902 Hypoxemia: Secondary | ICD-10-CM | POA: Diagnosis not present

## 2023-05-27 DIAGNOSIS — D649 Anemia, unspecified: Secondary | ICD-10-CM | POA: Diagnosis not present

## 2023-05-27 DIAGNOSIS — K56609 Unspecified intestinal obstruction, unspecified as to partial versus complete obstruction: Secondary | ICD-10-CM | POA: Diagnosis not present

## 2023-05-27 DIAGNOSIS — N182 Chronic kidney disease, stage 2 (mild): Secondary | ICD-10-CM | POA: Diagnosis not present

## 2023-05-27 DIAGNOSIS — E440 Moderate protein-calorie malnutrition: Secondary | ICD-10-CM | POA: Diagnosis not present

## 2023-05-27 DIAGNOSIS — J09X2 Influenza due to identified novel influenza A virus with other respiratory manifestations: Secondary | ICD-10-CM | POA: Diagnosis not present

## 2023-05-28 DIAGNOSIS — E440 Moderate protein-calorie malnutrition: Secondary | ICD-10-CM | POA: Diagnosis not present

## 2023-05-28 DIAGNOSIS — R0902 Hypoxemia: Secondary | ICD-10-CM | POA: Diagnosis not present

## 2023-05-28 DIAGNOSIS — K56609 Unspecified intestinal obstruction, unspecified as to partial versus complete obstruction: Secondary | ICD-10-CM | POA: Diagnosis not present

## 2023-05-28 DIAGNOSIS — Z48815 Encounter for surgical aftercare following surgery on the digestive system: Secondary | ICD-10-CM | POA: Diagnosis not present

## 2023-05-28 DIAGNOSIS — D649 Anemia, unspecified: Secondary | ICD-10-CM | POA: Diagnosis not present

## 2023-05-28 DIAGNOSIS — N182 Chronic kidney disease, stage 2 (mild): Secondary | ICD-10-CM | POA: Diagnosis not present

## 2023-05-28 DIAGNOSIS — I7143 Infrarenal abdominal aortic aneurysm, without rupture: Secondary | ICD-10-CM | POA: Diagnosis not present

## 2023-05-28 DIAGNOSIS — J09X2 Influenza due to identified novel influenza A virus with other respiratory manifestations: Secondary | ICD-10-CM | POA: Diagnosis not present

## 2023-05-28 DIAGNOSIS — N3000 Acute cystitis without hematuria: Secondary | ICD-10-CM | POA: Diagnosis not present

## 2023-06-01 DIAGNOSIS — N182 Chronic kidney disease, stage 2 (mild): Secondary | ICD-10-CM | POA: Diagnosis not present

## 2023-06-01 DIAGNOSIS — Z48815 Encounter for surgical aftercare following surgery on the digestive system: Secondary | ICD-10-CM | POA: Diagnosis not present

## 2023-06-01 DIAGNOSIS — I7143 Infrarenal abdominal aortic aneurysm, without rupture: Secondary | ICD-10-CM | POA: Diagnosis not present

## 2023-06-01 DIAGNOSIS — J09X2 Influenza due to identified novel influenza A virus with other respiratory manifestations: Secondary | ICD-10-CM | POA: Diagnosis not present

## 2023-06-01 DIAGNOSIS — D649 Anemia, unspecified: Secondary | ICD-10-CM | POA: Diagnosis not present

## 2023-06-01 DIAGNOSIS — K56609 Unspecified intestinal obstruction, unspecified as to partial versus complete obstruction: Secondary | ICD-10-CM | POA: Diagnosis not present

## 2023-06-01 DIAGNOSIS — N3000 Acute cystitis without hematuria: Secondary | ICD-10-CM | POA: Diagnosis not present

## 2023-06-01 DIAGNOSIS — E440 Moderate protein-calorie malnutrition: Secondary | ICD-10-CM | POA: Diagnosis not present

## 2023-06-01 DIAGNOSIS — R0902 Hypoxemia: Secondary | ICD-10-CM | POA: Diagnosis not present

## 2023-06-02 DIAGNOSIS — D649 Anemia, unspecified: Secondary | ICD-10-CM | POA: Diagnosis not present

## 2023-06-02 DIAGNOSIS — I7143 Infrarenal abdominal aortic aneurysm, without rupture: Secondary | ICD-10-CM | POA: Diagnosis not present

## 2023-06-02 DIAGNOSIS — Z9889 Other specified postprocedural states: Secondary | ICD-10-CM | POA: Diagnosis not present

## 2023-06-02 DIAGNOSIS — Z48815 Encounter for surgical aftercare following surgery on the digestive system: Secondary | ICD-10-CM | POA: Diagnosis not present

## 2023-06-02 DIAGNOSIS — I700 Atherosclerosis of aorta: Secondary | ICD-10-CM | POA: Diagnosis not present

## 2023-06-02 DIAGNOSIS — K56609 Unspecified intestinal obstruction, unspecified as to partial versus complete obstruction: Secondary | ICD-10-CM | POA: Diagnosis not present

## 2023-06-02 DIAGNOSIS — R634 Abnormal weight loss: Secondary | ICD-10-CM | POA: Diagnosis not present

## 2023-06-02 DIAGNOSIS — E440 Moderate protein-calorie malnutrition: Secondary | ICD-10-CM | POA: Diagnosis not present

## 2023-06-02 DIAGNOSIS — R0902 Hypoxemia: Secondary | ICD-10-CM | POA: Diagnosis not present

## 2023-06-02 DIAGNOSIS — D696 Thrombocytopenia, unspecified: Secondary | ICD-10-CM | POA: Diagnosis not present

## 2023-06-02 DIAGNOSIS — N3000 Acute cystitis without hematuria: Secondary | ICD-10-CM | POA: Diagnosis not present

## 2023-06-02 DIAGNOSIS — J09X2 Influenza due to identified novel influenza A virus with other respiratory manifestations: Secondary | ICD-10-CM | POA: Diagnosis not present

## 2023-06-02 DIAGNOSIS — R7303 Prediabetes: Secondary | ICD-10-CM | POA: Diagnosis not present

## 2023-06-02 DIAGNOSIS — N182 Chronic kidney disease, stage 2 (mild): Secondary | ICD-10-CM | POA: Diagnosis not present

## 2023-06-05 DIAGNOSIS — E440 Moderate protein-calorie malnutrition: Secondary | ICD-10-CM | POA: Diagnosis not present

## 2023-06-05 DIAGNOSIS — N3000 Acute cystitis without hematuria: Secondary | ICD-10-CM | POA: Diagnosis not present

## 2023-06-05 DIAGNOSIS — J09X2 Influenza due to identified novel influenza A virus with other respiratory manifestations: Secondary | ICD-10-CM | POA: Diagnosis not present

## 2023-06-05 DIAGNOSIS — N182 Chronic kidney disease, stage 2 (mild): Secondary | ICD-10-CM | POA: Diagnosis not present

## 2023-06-05 DIAGNOSIS — R0902 Hypoxemia: Secondary | ICD-10-CM | POA: Diagnosis not present

## 2023-06-05 DIAGNOSIS — Z48815 Encounter for surgical aftercare following surgery on the digestive system: Secondary | ICD-10-CM | POA: Diagnosis not present

## 2023-06-05 DIAGNOSIS — K56609 Unspecified intestinal obstruction, unspecified as to partial versus complete obstruction: Secondary | ICD-10-CM | POA: Diagnosis not present

## 2023-06-05 DIAGNOSIS — D649 Anemia, unspecified: Secondary | ICD-10-CM | POA: Diagnosis not present

## 2023-06-05 DIAGNOSIS — I7143 Infrarenal abdominal aortic aneurysm, without rupture: Secondary | ICD-10-CM | POA: Diagnosis not present

## 2023-06-09 ENCOUNTER — Encounter: Payer: Self-pay | Admitting: Vascular Surgery

## 2023-06-09 ENCOUNTER — Ambulatory Visit: Payer: Medicare PPO | Admitting: Vascular Surgery

## 2023-06-09 VITALS — BP 106/65 | HR 98 | Temp 97.4°F | Wt 143.0 lb

## 2023-06-09 DIAGNOSIS — I7143 Infrarenal abdominal aortic aneurysm, without rupture: Secondary | ICD-10-CM | POA: Diagnosis not present

## 2023-06-09 NOTE — Progress Notes (Signed)
Patient name: Patricia Peters MRN: [id number] DOB: [birth year] Sex: female  REASON FOR CONSULT: 2.8 cm abdominal aortic aneurysm  HPI: Patricia Peters is a 87 y.o. female, that presents for evaluation of a 2.8 cm abdominal aortic aneurysm.  This was discovered on a CT scan incidentally when she was admitted for small bowel obstruction ultimately requiring ex lap on 05/08/2023.  She has no piror knowledge of her aneurysm prior to the admission.  She has been recovering from laparotomy where she underwent lysis of adhesions.  States she is getting ready to go back to [place] where she will spend the winter until next July.  No new abdominal or back pain on evaluation today.  Past Medical History:  Diagnosis Date   Cataract    Neuromuscular disorder (HCC)    PHN (postherpetic neuralgia) 06/25/2013    Past Surgical History:  Procedure Laterality Date   CHOLECYSTECTOMY     EYE SURGERY     LAPAROTOMY N/A 05/08/2023   Procedure: EXPLORATORY LAPAROTOMY WITH EXTENSIVE LYSIS OF ADHESSIONS;  Surgeon: Griselda Miner, MD;  Location: [facility];  Service: General;  Laterality: N/A;    Family History  Problem Relation Age of Onset   Diabetes Mother    Cancer Father    Cancer Maternal Grandmother    Breast cancer Neg Hx     SOCIAL HISTORY: Social History   Socioeconomic History   Marital status: Married    Spouse name: Not on file   Number of children: Not on file   Years of education: Not on file   Highest education level: Not on file  Occupational History   Not on file  Tobacco Use   Smoking status: Never   Smokeless tobacco: Never  Substance and Sexual Activity   Alcohol use: Not on file   Drug use: Not on file   Sexual activity: Not on file  Other Topics Concern   Not on file  Social History Narrative   Not on file   Social Determinants of Health   Financial Resource Strain: Not on file  Food Insecurity: No Food Insecurity (05/02/2023)   Hunger Vital Sign    Worried About  Running Out of Food in the Last Year: Never true    Ran Out of Food in the Last Year: Never true  Transportation Needs: No Transportation Needs (05/02/2023)   PRAPARE - Administrator, Civil Service (Medical): No    Lack of Transportation (Non-Medical): No  Physical Activity: Not on file  Stress: Not on file  Social Connections: Not on file  Intimate Partner Violence: Unknown (05/02/2023)   Humiliation, Afraid, Rape, and Kick questionnaire    Fear of Current or Ex-Partner: No    Emotionally Abused: No    Physically Abused: No    Sexually Abused: Patient declined    Allergies  Allergen Reactions   Ciprofloxacin Hives   Sulfa Antibiotics Other (See Comments)    dizzy    Current Outpatient Medications  Medication Sig Dispense Refill   acetaminophen (TYLENOL) 325 MG tablet Take 325 mg by mouth as needed for headache or moderate pain.     brompheniramine-pseudoephedrine-DM 30-2-10 MG/5ML syrup Take by mouth as needed (cough).     docusate sodium (COLACE) 100 MG capsule Take 1 capsule (100 mg total) by mouth 2 (two) times daily. 10 capsule 0   famotidine (PEPCID) 20 MG tablet Take 20 mg by mouth as needed for heartburn or indigestion.     feeding  supplement (ENSURE ENLIVE / ENSURE PLUS) LIQD Take 237 mLs by mouth 2 (two) times daily between meals. 237 mL 12   ibuprofen (ADVIL) 200 MG tablet Take 200 mg by mouth as needed for moderate pain.     lidocaine (LIDODERM) 5 % Place 1 patch onto the skin daily. Remove & Discard patch within 12 hours or as directed by MD 30 patch 5   pantoprazole (PROTONIX) 40 MG tablet Take 1 tablet (40 mg total) by mouth daily. 30 tablet 0   No current facility-administered medications for this visit.    REVIEW OF SYSTEMS:  [X]  denotes positive finding, [ ]  denotes negative finding Cardiac  Comments:  Chest pain or chest pressure:    Shortness of breath upon exertion:    Short of breath when lying flat:    Irregular heart rhythm:         Vascular    Pain in calf, thigh, or hip brought on by ambulation:    Pain in feet at night that wakes you up from your sleep:     Blood clot in your veins:    Leg swelling:         Pulmonary    Oxygen at home:    Productive cough:     Wheezing:         Neurologic    Sudden weakness in arms or legs:     Sudden numbness in arms or legs:     Sudden onset of difficulty speaking or slurred speech:    Temporary loss of vision in one eye:     Problems with dizziness:         Gastrointestinal    Blood in stool:     Vomited blood:         Genitourinary    Burning when urinating:     Blood in urine:        Psychiatric    Major depression:         Hematologic    Bleeding problems:    Problems with blood clotting too easily:        Skin    Rashes or ulcers:        Constitutional    Fever or chills:      PHYSICAL EXAM: Vitals:   06/09/23 1514  BP: 106/65  Pulse: 98  Temp: (!) 97.4 F (36.3 C)  TempSrc: Temporal  SpO2: 97%  Weight: 143 lb (64.9 kg)    GENERAL: The patient is a well-nourished female, in no acute distress. The vital signs are documented above. CARDIAC: There is a regular rate and rhythm.  VASCULAR:  Bilateral femoral pulses palpable Bilateral DP pulses palpable PULMONARY: No respiratory distress ABDOMEN: Soft and non-tender.  No significant pain with midline laparotomy healing. MUSCULOSKELETAL: There are no major deformities or cyanosis. NEUROLOGIC: No focal weakness or paresthesias are detected. SKIN: There are no ulcers or rashes noted. PSYCHIATRIC: The patient has a normal affect.  DATA:   CT reviewed 05/02/23   Assessment/Plan:  87 year old female presents for evaluation of abdominal aortic aneurysm identified incidentally on a CT abdomen pelvis from 05/02/2023 when she was admitted with small bowel obstruction ultimately requiring ex lap with lysis of adhesions.  She has no prior CT imaging for comparison.  She has no knowledge of this  aneurysm as this was an incidental discovery.  I discussed that I measured her aneurysm with a maximal diameter of 2.7 cm but the bigger concern is the saccular morphology.  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   I discussed saccular aneurysms can be higher risk morphology.  I would like to get another CT scan in about 3 months in order to evaluate for any interval growth.  I think this would be fairly straightforward to repair at this time with a stent graft.  Complicating factor is she is leaving for [place] where she will stay until July 2025.  I discussed that I would favor her getting a CT in 3 to 6 months in [place] to ensure there has been no interval growth as she continues to recover from her laparotomy with lysis of adhesions.  Discussed if she wants to send me the CT scan I will review it.  I will set her up to see me next July 2025 when she is back in [place] with a CT scan as well.   Cephus Shelling, MD [facility] Office: [phone]

## 2023-06-10 DIAGNOSIS — E440 Moderate protein-calorie malnutrition: Secondary | ICD-10-CM | POA: Diagnosis not present

## 2023-06-10 DIAGNOSIS — N182 Chronic kidney disease, stage 2 (mild): Secondary | ICD-10-CM | POA: Diagnosis not present

## 2023-06-10 DIAGNOSIS — K56609 Unspecified intestinal obstruction, unspecified as to partial versus complete obstruction: Secondary | ICD-10-CM | POA: Diagnosis not present

## 2023-06-10 DIAGNOSIS — I7143 Infrarenal abdominal aortic aneurysm, without rupture: Secondary | ICD-10-CM | POA: Diagnosis not present

## 2023-06-10 DIAGNOSIS — Z48815 Encounter for surgical aftercare following surgery on the digestive system: Secondary | ICD-10-CM | POA: Diagnosis not present

## 2023-06-10 DIAGNOSIS — N3000 Acute cystitis without hematuria: Secondary | ICD-10-CM | POA: Diagnosis not present

## 2023-06-10 DIAGNOSIS — J09X2 Influenza due to identified novel influenza A virus with other respiratory manifestations: Secondary | ICD-10-CM | POA: Diagnosis not present

## 2023-06-10 DIAGNOSIS — R0902 Hypoxemia: Secondary | ICD-10-CM | POA: Diagnosis not present

## 2023-06-10 DIAGNOSIS — D649 Anemia, unspecified: Secondary | ICD-10-CM | POA: Diagnosis not present

## 2023-06-25 DIAGNOSIS — R7303 Prediabetes: Secondary | ICD-10-CM | POA: Diagnosis not present

## 2023-06-25 DIAGNOSIS — D649 Anemia, unspecified: Secondary | ICD-10-CM | POA: Diagnosis not present

## 2023-06-25 DIAGNOSIS — K56609 Unspecified intestinal obstruction, unspecified as to partial versus complete obstruction: Secondary | ICD-10-CM | POA: Diagnosis not present

## 2023-06-25 DIAGNOSIS — I700 Atherosclerosis of aorta: Secondary | ICD-10-CM | POA: Diagnosis not present

## 2023-06-25 DIAGNOSIS — D696 Thrombocytopenia, unspecified: Secondary | ICD-10-CM | POA: Diagnosis not present

## 2023-06-25 DIAGNOSIS — I7143 Infrarenal abdominal aortic aneurysm, without rupture: Secondary | ICD-10-CM | POA: Diagnosis not present

## 2023-06-25 DIAGNOSIS — Z0000 Encounter for general adult medical examination without abnormal findings: Secondary | ICD-10-CM | POA: Diagnosis not present

## 2023-06-25 DIAGNOSIS — E7800 Pure hypercholesterolemia, unspecified: Secondary | ICD-10-CM | POA: Diagnosis not present

## 2023-06-25 DIAGNOSIS — Z9889 Other specified postprocedural states: Secondary | ICD-10-CM | POA: Diagnosis not present

## 2023-06-25 DIAGNOSIS — F418 Other specified anxiety disorders: Secondary | ICD-10-CM | POA: Diagnosis not present

## 2023-07-02 ENCOUNTER — Ambulatory Visit: Payer: Medicare PPO

## 2023-07-08 ENCOUNTER — Ambulatory Visit
Admission: RE | Admit: 2023-07-08 | Discharge: 2023-07-08 | Disposition: A | Discharge status: Home / Self Care | Payer: Medicare PPO | Source: Ambulatory Visit | Attending: Internal Medicine | Admitting: Internal Medicine

## 2023-07-08 DIAGNOSIS — Z1231 Encounter for screening mammogram for malignant neoplasm of breast: Secondary | ICD-10-CM

## 2023-07-22 DIAGNOSIS — H524 Presbyopia: Secondary | ICD-10-CM | POA: Diagnosis not present

## 2023-09-01 ENCOUNTER — Telehealth: Payer: Self-pay

## 2023-09-01 DIAGNOSIS — Z8719 Personal history of other diseases of the digestive system: Secondary | ICD-10-CM | POA: Diagnosis not present

## 2023-09-01 DIAGNOSIS — I700 Atherosclerosis of aorta: Secondary | ICD-10-CM | POA: Diagnosis not present

## 2023-09-01 DIAGNOSIS — I714 Abdominal aortic aneurysm, without rupture, unspecified: Secondary | ICD-10-CM | POA: Diagnosis not present

## 2023-09-01 NOTE — Telephone Encounter (Signed)
 Triage Call: Pt calls stating she needed to the specifics on what Dr. Gretta saw on her CT scan and the recommendation for follow-up testing, that she is going to a new doctor in [place]  where she plans to have that doctor order the test that Dr. Gretta suggested.  She was questioning the different follow-up standards and why it was different.   -Discussed with patient the standard guidelines/recommendations and the doctor's specific recommendation for her, explaining the concern was not just a 2.7 cm aneurysm itself there is an area of plaque of concern.  Pt confirms understand of Dr. Thomas recommendation and will meet with this new doctor this afternoon.

## 2023-09-10 DIAGNOSIS — D171 Benign lipomatous neoplasm of skin and subcutaneous tissue of trunk: Secondary | ICD-10-CM | POA: Diagnosis not present

## 2023-10-12 DIAGNOSIS — N950 Postmenopausal bleeding: Secondary | ICD-10-CM | POA: Diagnosis not present

## 2023-10-14 ENCOUNTER — Telehealth: Payer: Self-pay

## 2023-10-14 NOTE — Telephone Encounter (Signed)
 Advice: -pt called stating she is in [place] and her doctor told her that they ordered her an ultrasound instead of a CT because there is less radiation.  She is calling to see what she should do. -VM is full-unable to leave message

## 2023-10-21 DIAGNOSIS — J069 Acute upper respiratory infection, unspecified: Secondary | ICD-10-CM | POA: Diagnosis not present

## 2023-10-21 DIAGNOSIS — Z20828 Contact with and (suspected) exposure to other viral communicable diseases: Secondary | ICD-10-CM | POA: Diagnosis not present

## 2023-10-21 DIAGNOSIS — Z20822 Contact with and (suspected) exposure to covid-19: Secondary | ICD-10-CM | POA: Diagnosis not present

## 2023-10-21 DIAGNOSIS — J329 Chronic sinusitis, unspecified: Secondary | ICD-10-CM | POA: Diagnosis not present

## 2023-10-29 DIAGNOSIS — N950 Postmenopausal bleeding: Secondary | ICD-10-CM | POA: Diagnosis not present

## 2024-02-26 DIAGNOSIS — R252 Cramp and spasm: Secondary | ICD-10-CM | POA: Diagnosis not present

## 2024-02-26 DIAGNOSIS — R350 Frequency of micturition: Secondary | ICD-10-CM | POA: Diagnosis not present

## 2024-02-26 DIAGNOSIS — N3001 Acute cystitis with hematuria: Secondary | ICD-10-CM | POA: Diagnosis not present

## 2024-03-16 DIAGNOSIS — R350 Frequency of micturition: Secondary | ICD-10-CM | POA: Diagnosis not present

## 2024-06-27 DIAGNOSIS — D696 Thrombocytopenia, unspecified: Secondary | ICD-10-CM | POA: Diagnosis not present

## 2024-06-27 DIAGNOSIS — J309 Allergic rhinitis, unspecified: Secondary | ICD-10-CM | POA: Diagnosis not present

## 2024-06-27 DIAGNOSIS — G479 Sleep disorder, unspecified: Secondary | ICD-10-CM | POA: Diagnosis not present

## 2024-06-27 DIAGNOSIS — D649 Anemia, unspecified: Secondary | ICD-10-CM | POA: Diagnosis not present

## 2024-06-27 DIAGNOSIS — R7303 Prediabetes: Secondary | ICD-10-CM | POA: Diagnosis not present

## 2024-06-27 DIAGNOSIS — E7800 Pure hypercholesterolemia, unspecified: Secondary | ICD-10-CM | POA: Diagnosis not present

## 2024-06-27 DIAGNOSIS — F418 Other specified anxiety disorders: Secondary | ICD-10-CM | POA: Diagnosis not present

## 2024-06-27 DIAGNOSIS — I7143 Infrarenal abdominal aortic aneurysm, without rupture: Secondary | ICD-10-CM | POA: Diagnosis not present

## 2024-06-27 DIAGNOSIS — Z0000 Encounter for general adult medical examination without abnormal findings: Secondary | ICD-10-CM | POA: Diagnosis not present
